# Patient Record
Sex: Female | Born: 1935 | Race: White | Hispanic: No | Smoking: Former smoker
Health system: Southern US, Community
[De-identification: ages and names within clinical notes are randomized; demographics above are authoritative.]

## PROBLEM LIST (undated history)

## (undated) DIAGNOSIS — E785 Hyperlipidemia, unspecified: Secondary | ICD-10-CM

## (undated) DIAGNOSIS — E039 Hypothyroidism, unspecified: Secondary | ICD-10-CM

## (undated) DIAGNOSIS — F32A Depression, unspecified: Secondary | ICD-10-CM

## (undated) DIAGNOSIS — I509 Heart failure, unspecified: Secondary | ICD-10-CM

## (undated) DIAGNOSIS — I10 Essential (primary) hypertension: Secondary | ICD-10-CM

## (undated) DIAGNOSIS — T8859XA Other complications of anesthesia, initial encounter: Secondary | ICD-10-CM

## (undated) DIAGNOSIS — K219 Gastro-esophageal reflux disease without esophagitis: Secondary | ICD-10-CM

## (undated) DIAGNOSIS — C801 Malignant (primary) neoplasm, unspecified: Secondary | ICD-10-CM

## (undated) DIAGNOSIS — M1990 Unspecified osteoarthritis, unspecified site: Secondary | ICD-10-CM

## (undated) DIAGNOSIS — N289 Disorder of kidney and ureter, unspecified: Secondary | ICD-10-CM

## (undated) DIAGNOSIS — E119 Type 2 diabetes mellitus without complications: Secondary | ICD-10-CM

## (undated) DIAGNOSIS — I2510 Atherosclerotic heart disease of native coronary artery without angina pectoris: Secondary | ICD-10-CM

## (undated) HISTORY — PX: CAROTID ENDARTERECTOMY: SUR193

## (undated) HISTORY — PX: CORONARY ANGIOPLASTY: SHX604

## (undated) HISTORY — PX: CORONARY STENT PLACEMENT: SHX1402

## (undated) HISTORY — DX: Type 2 diabetes mellitus without complications: E11.9

## (undated) HISTORY — DX: Essential (primary) hypertension: I10

## (undated) HISTORY — PX: TOTAL VAGINAL HYSTERECTOMY: SHX2548

## (undated) HISTORY — DX: Hyperlipidemia, unspecified: E78.5

## (undated) HISTORY — PX: CARDIAC CATHETERIZATION: SHX172

## (undated) HISTORY — DX: Atherosclerotic heart disease of native coronary artery without angina pectoris: I25.10

---

## 2010-09-21 DIAGNOSIS — I219 Acute myocardial infarction, unspecified: Secondary | ICD-10-CM

## 2010-09-21 HISTORY — DX: Acute myocardial infarction, unspecified: I21.9

## 2019-11-16 ENCOUNTER — Other Ambulatory Visit: Payer: Self-pay

## 2019-11-16 ENCOUNTER — Ambulatory Visit (INDEPENDENT_AMBULATORY_CARE_PROVIDER_SITE_OTHER): Payer: Medicare Other | Admitting: Pulmonary Disease

## 2019-11-16 ENCOUNTER — Telehealth: Payer: Self-pay | Admitting: Pulmonary Disease

## 2019-11-16 ENCOUNTER — Ambulatory Visit (INDEPENDENT_AMBULATORY_CARE_PROVIDER_SITE_OTHER): Payer: Medicare Other

## 2019-11-16 ENCOUNTER — Other Ambulatory Visit (HOSPITAL_COMMUNITY)
Admit: 2019-11-16 | Discharge: 2019-11-16 | Disposition: A | Discharge status: Home / Self Care | Payer: Medicare Other | Source: Ambulatory Visit | Attending: Pulmonary Disease | Admitting: Pulmonary Disease

## 2019-11-16 ENCOUNTER — Encounter: Payer: Self-pay | Admitting: Pulmonary Disease

## 2019-11-16 ENCOUNTER — Other Ambulatory Visit (HOSPITAL_COMMUNITY)
Admission: RE | Admit: 2019-11-16 | Discharge: 2019-11-16 | Disposition: A | Discharge status: Home / Self Care | Payer: Medicare Other | Source: Ambulatory Visit | Attending: Pulmonary Disease | Admitting: Pulmonary Disease

## 2019-11-16 VITALS — BP 106/56 | HR 66 | Ht 67.0 in | Wt 114.2 lb

## 2019-11-16 DIAGNOSIS — Z9689 Presence of other specified functional implants: Secondary | ICD-10-CM | POA: Diagnosis not present

## 2019-11-16 DIAGNOSIS — J90 Pleural effusion, not elsewhere classified: Secondary | ICD-10-CM | POA: Insufficient documentation

## 2019-11-16 LAB — BODY FLUID CELL COUNT WITH DIFFERENTIAL
Eos, Fluid: 66 %
Lymphs, Fluid: 26 %
Monocyte-Macrophage-Serous Fluid: 7 % — ABNORMAL LOW (ref 50–90)
Neutrophil Count, Fluid: 1 % (ref 0–25)
Total Nucleated Cell Count, Fluid: 1988 cu mm — ABNORMAL HIGH (ref 0–1000)

## 2019-11-16 LAB — ALBUMIN: Albumin: 3 g/dL — ABNORMAL LOW (ref 3.5–5.2)

## 2019-11-16 LAB — PROTEIN, TOTAL: Total Protein: 6 g/dL (ref 6.0–8.3)

## 2019-11-16 LAB — ALBUMIN, PLEURAL OR PERITONEAL FLUID: Albumin, Fluid: 1.3 g/dL

## 2019-11-16 IMAGING — DX DG CHEST 2V
2 series · 2 of 2 positions shown · non-contrast
Comparison: None.

CLINICAL DATA: Left-sided pleural effusion

EXAM:
CHEST - 2 VIEW

[chest pa]
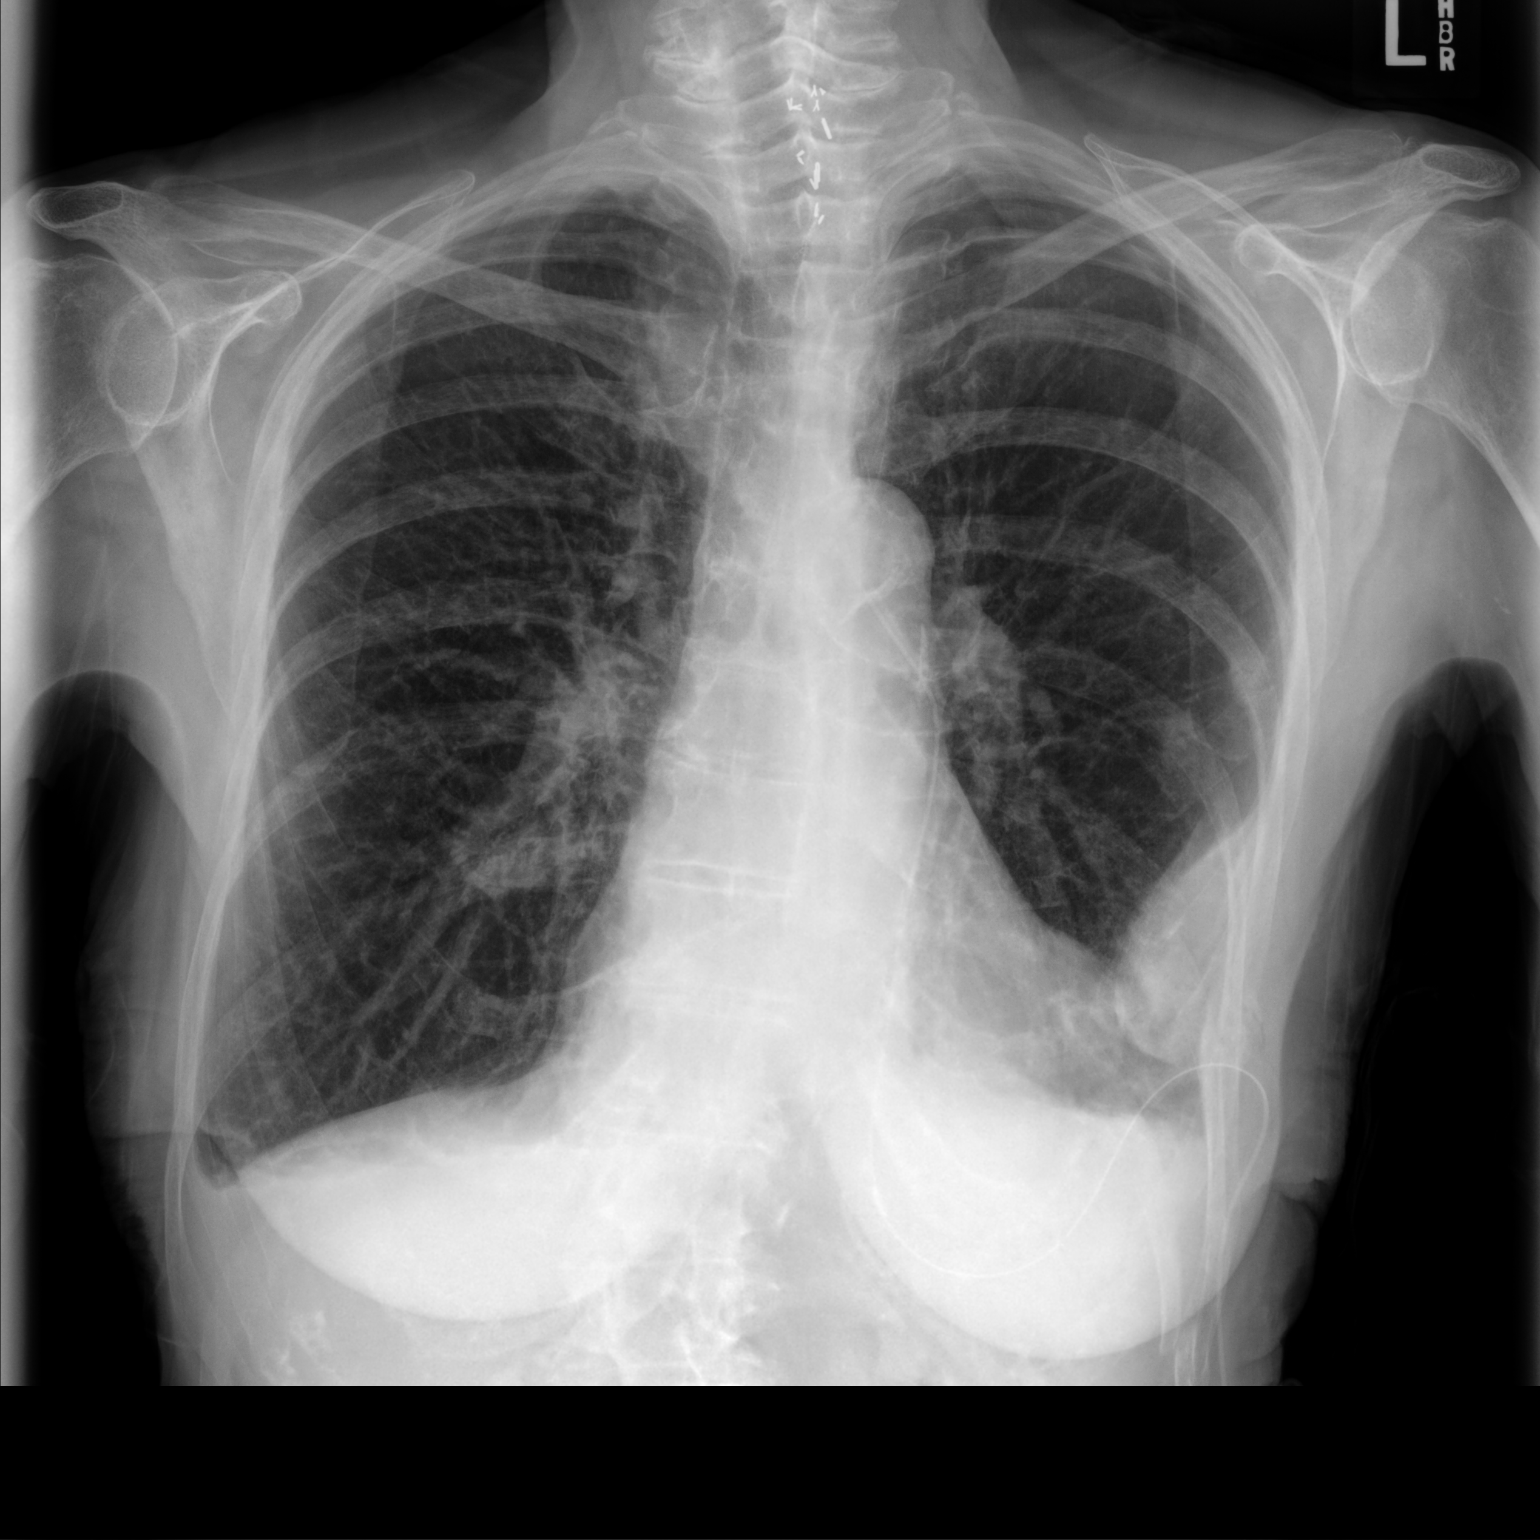

[chest lat]
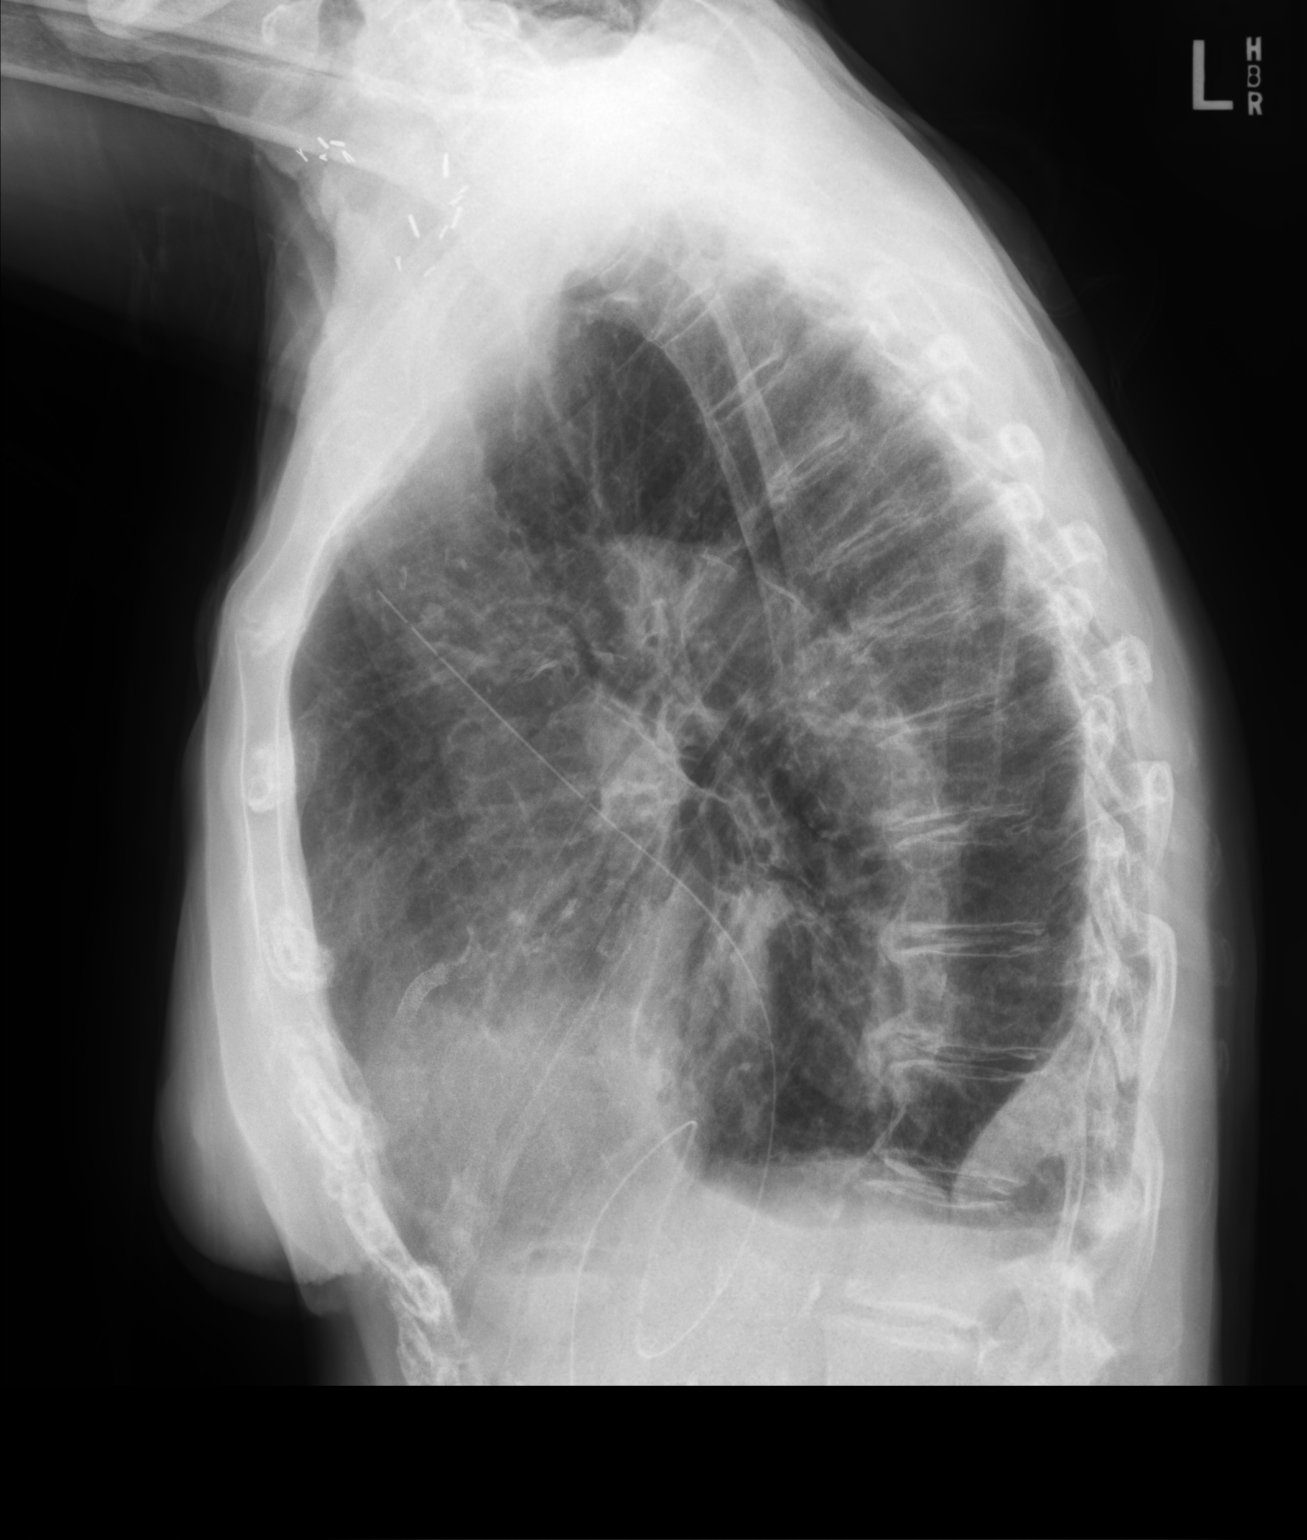

[2 of 2 positions shown; findings below may reference images not displayed]

FINDINGS: There is a chest drain present with the tip located anterior to the
aortic arch on the left medially. There is a minimal left apical
pneumothorax. There is an ovoid opacity along the posterior, lateral
aspect of the left hemithorax measuring 5.9 x 3.3 cm. Question
hematoma versus loculated effusion in this area. Note that there are
nearby rib fractures. There is atelectatic change in the left base.
There is a minimal right pleural effusion with mild lateral right
base atelectasis. Lungs elsewhere clear.

Heart size and pulmonary vascularity are normal. There is a stent in
the right coronary artery. No adenopathy evident. There is aortic
atherosclerosis. There are surgical clips in the upper thoracic
region in thyroid region.
IMPRESSION: Drain present anteriorly and medially on the left. Question
loculated effusion versus hematoma versus mass along the lateral
left hemithorax with nearby mildly displaced rib fractures. There is
a minimal left apical pneumothorax.

Minimal right pleural effusion with bibasilar atelectasis. Cardiac
silhouette normal. Aortic Atherosclerosis ([RN]-[RN]).

These results will be called to the ordering clinician or
representative by the Radiologist Assistant, and communication
documented in the PACS or zVision Dashboard.

## 2019-11-16 NOTE — Telephone Encounter (Signed)
Call report on cxr dated 11/16/19   IMPRESSION: Drain present anteriorly and medially on the left. Question loculated effusion versus hematoma versus mass along the lateral left hemithorax with nearby mildly displaced rib fractures. There is a minimal left apical pneumothorax.  Minimal right pleural effusion with bibasilar atelectasis. Cardiac silhouette normal. Aortic Atherosclerosis (ICD10-I70.0).  These results will be called to the ordering clinician or representative by the Radiologist Assistant, and communication documented in the PACS or zVision Dashboard.   Electronically Signed   By: Lowella Grip III M.D.   On: 11/16/2019 09:50

## 2019-11-16 NOTE — Addendum Note (Signed)
Addended by: Suzzanne Cloud E on: 11/16/2019 11:21 AM   Modules accepted: Orders

## 2019-11-16 NOTE — Telephone Encounter (Signed)
Called [facility] and had to [facility]

## 2019-11-16 NOTE — Addendum Note (Signed)
Addended by: Suzzanne Cloud E on: 11/16/2019 11:16 AM   Modules accepted: Orders

## 2019-11-16 NOTE — Patient Instructions (Addendum)
Thank you for visiting Dr. Valeta Harms at [facility]. Today we recommend the following:  Orders Placed This Encounter  Procedures  . Body Fluid Culture  . DG Chest 2 View  . Body fluid cell count with differential  . Lactate Dehydrogenase (LDH)  . Protein, total  . Albumin  . Lactate Dehydrogenase (LDH)  . Protein, total  . Albumin, fluid - peritoneal or pleural  . Glucose, Body Fluid Other  . Triglycerides, Pleural Fluid   With CXR at time of next office visit.   Return in about 4 weeks (around 12/14/2019) for with APP or Dr. Valeta Harms.    Please do your part to reduce the spread of COVID-19.

## 2019-11-16 NOTE — Addendum Note (Signed)
Addended by: Lorretta Harp on: 11/16/2019 11:13 AM   Modules accepted: Orders

## 2019-11-16 NOTE — Addendum Note (Signed)
Addended by: Lorretta Harp on: 11/16/2019 11:02 AM   Modules accepted: Orders

## 2019-11-16 NOTE — Addendum Note (Signed)
Addended by: Suzzanne Cloud E on: 11/16/2019 11:30 AM   Modules accepted: Orders

## 2019-11-16 NOTE — Addendum Note (Signed)
Addended by: Suzzanne Cloud E on: 11/16/2019 11:31 AM   Modules accepted: Orders

## 2019-11-16 NOTE — Addendum Note (Signed)
Addended by: Suzzanne Cloud E on: 11/16/2019 11:07 AM   Modules accepted: Orders

## 2019-11-16 NOTE — Addendum Note (Signed)
Addended by: Lorretta Harp on: 11/16/2019 11:26 AM   Modules accepted: Orders

## 2019-11-16 NOTE — Addendum Note (Signed)
Addended by: Suzzanne Cloud E on: 11/16/2019 11:24 AM   Modules accepted: Orders

## 2019-11-16 NOTE — Addendum Note (Signed)
Addended by: Suzzanne Cloud E on: 11/16/2019 11:27 AM   Modules accepted: Orders

## 2019-11-16 NOTE — Progress Notes (Signed)
Synopsis: Referred in Feb 2021 for new IPC, from [place] by No ref. provider found  Subjective:   PATIENT ID: [name] GENDER: female DOB: 29-May-1932, MRN: [id number]  Chief Complaint  Patient presents with  . Consult    Pt recently moved from [place]. Self referral due to left lung filling up with fluid. Pt's daughter stated pt fell 2 months ago and broke 5 ribs and she states former pulmonologist is unsure if that could be the reason with lung filling up with fluid. Pt does now have a catheter that they have been draining.    86 yo FM, fall in dec 2020, not hospitalized but had left sided broken ribs, went back to urgent care and sent pulmonary due to knew effusion. Tapped new right sided effusion X5. Each time around 1 L of fluid. Sent to ED. Evaluated by thoracic surgery and decision made for inpatient IPC placement.  Today here for follow-up.  Leaving from [place] she has moved here to the [place] area to live with her daughter.  The indwelling pleural catheter was placed on February 16.  Since then they have been draining this daily.  Patient has had no pain or discomfort.  Site has been draining approximately 200 cc/day with daily drainage.  They have brought their log with them.  They were set up with [facility].   Past Medical History:  Diagnosis Date  . DM (diabetes mellitus) ([place])   . HTN (hypertension)   . Myocardial infarct [facility]) 2012     Family History  Problem Relation Age of Onset  . Alzheimer's disease Mother   . Heart attack Father   . Heart disease Father   . Lymphoma Sister   . Heart failure Brother      Past Surgical History:  Procedure Laterality Date  . CORONARY STENT PLACEMENT      Social History   Socioeconomic History  . Marital status: Widowed    Spouse name: Not on file  . Number of children: Not on file  . Years of education: Not on file  . Highest education level: Not on file  Occupational History  . Not on file    Tobacco Use  . Smoking status: Former Smoker    Packs/day: 1.00    Years: 2.00    Pack years: 2.00    Types: Cigarettes    Quit date: 11/15/1969    Years since quitting: 50.0  . Smokeless tobacco: Never Used  Substance and Sexual Activity  . Alcohol use: Never  . Drug use: Never  . Sexual activity: Not on file  Other Topics Concern  . Not on file  Social History Narrative  . Not on file   Social Determinants of Health   Financial Resource Strain:   . Difficulty of Paying Living Expenses: Not on file  Food Insecurity:   . Worried About Charity fundraiser in the Last Year: Not on file  . Ran Out of Food in the Last Year: Not on file  Transportation Needs:   . Lack of Transportation (Medical): Not on file  . Lack of Transportation (Non-Medical): Not on file  Physical Activity:   . Days of Exercise per Week: Not on file  . Minutes of Exercise per Session: Not on file  Stress:   . Feeling of Stress : Not on file  Social Connections:   . Frequency of Communication with Friends and Family: Not on file  . Frequency of  Social Gatherings with Friends and Family: Not on file  . Attends Religious Services: Not on file  . Active Member of Clubs or Organizations: Not on file  . Attends Archivist Meetings: Not on file  . Marital Status: Not on file  Intimate Partner Violence:   . Fear of Current or Ex-Partner: Not on file  . Emotionally Abused: Not on file  . Physically Abused: Not on file  . Sexually Abused: Not on file     Allergies  Allergen Reactions  . Lisinopril     cough     Outpatient Medications Prior to Visit  Medication Sig Dispense Refill  . atorvastatin (LIPITOR) 80 MG tablet Take 80 mg by mouth every evening.    . Biotin 5000 MCG TABS Take 5,000 mcg by mouth daily.    . Cholecalciferol (VITAMIN D3 SUPER STRENGTH) 50 MCG (2000 UT) TABS Take 2,000 Units by mouth daily.    . clopidogrel (PLAVIX) 75 MG tablet Take 75 mg by mouth 3 (three) times a  week. Take Mon, Wed, Fri    . Coenzyme Q10 (COQ10) 200 MG CAPS Take 200 mg by mouth every evening.    Marland Kitchen levothyroxine (SYNTHROID) 25 MCG tablet Take 25 mcg by mouth daily before breakfast.    . losartan (COZAAR) 100 MG tablet Take 100 mg by mouth every evening.    . Metoprolol Tartrate 75 MG TABS Take 75 mg by mouth daily. Take 1.5 pills daily    . pioglitazone (ACTOS) 15 MG tablet Take 15 mg by mouth daily.    . sertraline (ZOLOFT) 50 MG tablet Take 50 mg by mouth at bedtime. Take 1/2 tablet at bedtime     No facility-administered medications prior to visit.    Review of Systems  Constitutional: Negative for chills, fever, malaise/fatigue and weight loss.  HENT: Negative for hearing loss, sore throat and tinnitus.   Eyes: Negative for blurred vision and double vision.  Respiratory: Positive for cough and shortness of breath. Negative for hemoptysis, sputum production, wheezing and stridor.   Cardiovascular: Negative for chest pain, palpitations, orthopnea, leg swelling and PND.  Gastrointestinal: Negative for abdominal pain, constipation, diarrhea, heartburn, nausea and vomiting.  Genitourinary: Negative for dysuria, hematuria and urgency.  Musculoskeletal: Negative for joint pain and myalgias.  Skin: Negative for itching and rash.  Neurological: Negative for dizziness, tingling, weakness and headaches.  Endo/Heme/Allergies: Negative for environmental allergies. Does not bruise/bleed easily.  Psychiatric/Behavioral: Negative for depression. The patient is not nervous/anxious and does not have insomnia.   All other systems reviewed and are negative.    Objective:  Physical Exam Vitals reviewed.  Constitutional:      General: She is not in acute distress.    Appearance: She is well-developed.  HENT:     Head: Normocephalic and atraumatic.  Eyes:     General: No scleral icterus.    Conjunctiva/sclera: Conjunctivae normal.     Pupils: Pupils are equal, round, and reactive to  light.  Neck:     Vascular: No JVD.     Trachea: No tracheal deviation.  Cardiovascular:     Rate and Rhythm: Normal rate and regular rhythm.     Heart sounds: Normal heart sounds. No murmur.  Pulmonary:     Effort: Pulmonary effort is normal. No tachypnea, accessory muscle usage or respiratory distress.     Breath sounds: No stridor. No wheezing, rhonchi or rales.     Comments: Some diminished breath sounds in the left in comparison  to the right.  Left chest drain tunneled superiorly and over the rib edge.  Insertion site clean dry and intact. Abdominal:     General: Bowel sounds are normal. There is no distension.     Palpations: Abdomen is soft.     Tenderness: There is no abdominal tenderness.  Musculoskeletal:        General: No tenderness.     Cervical back: Neck supple.  Lymphadenopathy:     Cervical: No cervical adenopathy.  Skin:    General: Skin is warm and dry.     Capillary Refill: Capillary refill takes less than 2 seconds.     Findings: No rash.  Neurological:     Mental Status: She is alert and oriented to person, place, and time.  Psychiatric:        Behavior: Behavior normal.      Vitals:   11/16/19 0907  BP: (!) 106/56  Pulse: 66  SpO2: 98%  Weight: 114 lb 3.2 oz (51.8 kg)  Height: 5\' 7"  (1.702 m)   98% on RA BMI Readings from Last 3 Encounters:  11/16/19 17.89 kg/m   Wt Readings from Last 3 Encounters:  11/16/19 114 lb 3.2 oz (51.8 kg)     CBC No results found for: WBC, RBC, HGB, HCT, PLT, MCV, MCH, MCHC, RDW, LYMPHSABS, MONOABS, EOSABS, BASOSABS   Chest Imaging: 11/16/2019: Chest x-ray Left-sided loculated effusion.  Chest drain in place looks like comes and over top of the rib and then down and then extends up into the chest. The patient's images have been independently reviewed by me.    Pulmonary Functions Testing Results: No flowsheet data found.     Assessment & Plan:     ICD-10-CM   1. Recurrent left pleural effusion  J90 DG  Chest 2 View    Body fluid cell count with differential    Lactate Dehydrogenase (LDH)    Protein, total    Albumin    Body Fluid Culture    Cytology - non pap    Lactate Dehydrogenase (LDH)    Protein, total    Albumin, fluid - peritoneal or pleural    Glucose, Body Fluid Other    Triglycerides, Pleural Fluid  2. Chest tube in place  Z96.89 DG Chest 2 View    Body fluid cell count with differential    Lactate Dehydrogenase (LDH)    Protein, total    Albumin    Body Fluid Culture    Cytology - non pap    Lactate Dehydrogenase (LDH)    Protein, total    Albumin, fluid - peritoneal or pleural    Glucose, Body Fluid Other    Triglycerides, Pleural Fluid  3. Pleural effusion  J90 Body fluid cell count with differential    Lactate Dehydrogenase (LDH)    Protein, total    Albumin    Body Fluid Culture    Cytology - non pap    Lactate Dehydrogenase (LDH)    Protein, total    Albumin, fluid - peritoneal or pleural    Glucose, Body Fluid Other    Triglycerides, Pleural Fluid    Discussion:  This is a 86 year old female with a recurrent left-sided pleural effusion.  Recently placed IPC on the left chest.  Currently draining around 200 cc a day.  This is a manual siphon device.  No evidence of cellulitis at insertion site  Plan: We attained 2 view chest x-ray today in the office to ensure appropriate tube placement.  Please see interpretation and read above. Siphon fluid today from chest cavity with appropriate connection device. Sterile technique was used for pleural fluid evacuation in the office.  We also used chlorhexidine to clean the insertion site and remove sutures which were still in place.  Approximately 400 cc of amber fluid was removed.  This was sent to the laboratory for analysis.  Please see orders for standard pleural fluid analysis. Patient was instructed as well as daughter on appropriate dressing changes. We will also communicate our recommendations on drainage  with [facility] there home health provider.  Greater than 50% of this patient's 72-minute office visit spent face-to-face discussing the recommendations treatment plan.   Current Outpatient Medications:  .  atorvastatin (LIPITOR) 80 MG tablet, Take 80 mg by mouth every evening., Disp: , Rfl:  .  Biotin 5000 MCG TABS, Take 5,000 mcg by mouth daily., Disp: , Rfl:  .  Cholecalciferol (VITAMIN D3 SUPER STRENGTH) 50 MCG (2000 UT) TABS, Take 2,000 Units by mouth daily., Disp: , Rfl:  .  clopidogrel (PLAVIX) 75 MG tablet, Take 75 mg by mouth 3 (three) times a week. Take Mon, Wed, Fri, Disp: , Rfl:  .  Coenzyme Q10 (COQ10) 200 MG CAPS, Take 200 mg by mouth every evening., Disp: , Rfl:  .  levothyroxine (SYNTHROID) 25 MCG tablet, Take 25 mcg by mouth daily before breakfast., Disp: , Rfl:  .  losartan (COZAAR) 100 MG tablet, Take 100 mg by mouth every evening., Disp: , Rfl:  .  Metoprolol Tartrate 75 MG TABS, Take 75 mg by mouth daily. Take 1.5 pills daily, Disp: , Rfl:  .  pioglitazone (ACTOS) 15 MG tablet, Take 15 mg by mouth daily., Disp: , Rfl:  .  sertraline (ZOLOFT) 50 MG tablet, Take 50 mg by mouth at bedtime. Take 1/2 tablet at bedtime, Disp: , Rfl:     Pleural fluid drainage procedure note through indwelling pleural catheter  Pre-operative Diagnosis: Left-sided pleural effusion  Post-operative Diagnosis: Left-sided pleural effusion  Indications: Left-sided pleural effusion  Procedure Details  Consent: Informed consent was obtained.  Chest x-ray was obtained prior to fluid drainage to ensure appropriate catheter placement.  Under semi-sterile conditions the patient was positioned.  Sutures were removed.  Using standard connection tubing for indwelling pleural catheter using sterile technique fluid was obtained without any difficulties and minimal blood loss.  A dressing was applied to the wound and wound care instructions were provided.   Findings 400 ml of clear pleural  fluid was obtained. A sample was sent to Pathology for cytogenetics, flow, and cell counts, as well as for infection analysis.  Complications:  None; patient tolerated the procedure well.          Condition: stable    Garner Nash, DO [facility] 11/16/2019 9:30 AM

## 2019-11-16 NOTE — Telephone Encounter (Signed)
Reviewed in clinic.  Thanks Garner Nash, DO [facility] 11/16/2019 1:38 PM

## 2019-11-16 NOTE — Addendum Note (Signed)
Addended by: Suzzanne Cloud E on: 11/16/2019 11:08 AM   Modules accepted: Orders

## 2019-11-16 NOTE — Addendum Note (Signed)
Addended by: Suzzanne Cloud E on: 11/16/2019 11:35 AM   Modules accepted: Orders

## 2019-11-16 NOTE — Addendum Note (Signed)
Addended by: Suzzanne Cloud E on: 11/16/2019 11:10 AM   Modules accepted: Orders

## 2019-11-17 ENCOUNTER — Telehealth: Payer: Self-pay | Admitting: Pulmonary Disease

## 2019-11-17 LAB — PROTEIN, BODY FLUID (OTHER): Protein, Fluid: 2.2 g/dL

## 2019-11-17 LAB — CYTOLOGY - NON PAP

## 2019-11-17 LAB — GLUCOSE, BODY FLUID OTHER: Glucose, Fluid: 158 mg/dL

## 2019-11-17 LAB — LACTATE DEHYDROGENASE: LDH: 195 U/L (ref 120–250)

## 2019-11-17 NOTE — Telephone Encounter (Signed)
[name] is returning phone call.  [name] phone number is [phone].

## 2019-11-17 NOTE — Telephone Encounter (Signed)
Spoke with [name] with [facility]  She states needs to know if she catheter was a pleurex catheter or aspira and is the drain still to be done daily  Per Dr Valeta Harms- drain fully every other day and catheter is a Gwen Pounds aware  Nothing further needed

## 2019-11-17 NOTE — Telephone Encounter (Signed)
If it's drained fully it dose not need to be drained everyday.   BLI

## 2019-11-17 NOTE — Telephone Encounter (Signed)
Spoke with the pt's daughter  She states that she was just made aware of the change to drain every other day, rather than daily  She is wanting to know why this change was made  Please advise thanks

## 2019-11-20 LAB — TRIGLYCERIDES, PLEURAL FLUID: Triglycerides, Pleural Fluid: 18 mg/dL

## 2019-11-20 LAB — LACTATE DEHYDROGENASE

## 2019-11-20 NOTE — Telephone Encounter (Signed)
Called and spoke with patient's daughter, Sherrie Mustache.  Dr. Juline Patch recommendations given. Understanding stated.  Nothing further at this time.

## 2019-11-21 LAB — BODY FLUID CULTURE

## 2019-11-25 ENCOUNTER — Other Ambulatory Visit: Payer: Self-pay

## 2019-11-25 ENCOUNTER — Emergency Department (HOSPITAL_COMMUNITY)
Admission: EM | Admit: 2019-11-25 | Discharge: 2019-11-25 | Disposition: A | Discharge status: Home / Self Care | Payer: Medicare Other | Attending: Emergency Medicine | Admitting: Emergency Medicine

## 2019-11-25 ENCOUNTER — Encounter (HOSPITAL_COMMUNITY): Payer: Self-pay

## 2019-11-25 ENCOUNTER — Emergency Department (HOSPITAL_COMMUNITY): Payer: Medicare Other

## 2019-11-25 DIAGNOSIS — S0003XA Contusion of scalp, initial encounter: Secondary | ICD-10-CM

## 2019-11-25 DIAGNOSIS — Y9389 Activity, other specified: Secondary | ICD-10-CM | POA: Insufficient documentation

## 2019-11-25 DIAGNOSIS — Z79899 Other long term (current) drug therapy: Secondary | ICD-10-CM | POA: Diagnosis not present

## 2019-11-25 DIAGNOSIS — I252 Old myocardial infarction: Secondary | ICD-10-CM | POA: Insufficient documentation

## 2019-11-25 DIAGNOSIS — I10 Essential (primary) hypertension: Secondary | ICD-10-CM | POA: Insufficient documentation

## 2019-11-25 DIAGNOSIS — Z87891 Personal history of nicotine dependence: Secondary | ICD-10-CM | POA: Diagnosis not present

## 2019-11-25 DIAGNOSIS — Y999 Unspecified external cause status: Secondary | ICD-10-CM | POA: Diagnosis not present

## 2019-11-25 DIAGNOSIS — W01198A Fall on same level from slipping, tripping and stumbling with subsequent striking against other object, initial encounter: Secondary | ICD-10-CM | POA: Diagnosis not present

## 2019-11-25 DIAGNOSIS — S0101XA Laceration without foreign body of scalp, initial encounter: Secondary | ICD-10-CM | POA: Insufficient documentation

## 2019-11-25 DIAGNOSIS — S0990XA Unspecified injury of head, initial encounter: Secondary | ICD-10-CM | POA: Diagnosis present

## 2019-11-25 DIAGNOSIS — W19XXXA Unspecified fall, initial encounter: Secondary | ICD-10-CM

## 2019-11-25 DIAGNOSIS — Y92010 Kitchen of single-family (private) house as the place of occurrence of the external cause: Secondary | ICD-10-CM | POA: Insufficient documentation

## 2019-11-25 DIAGNOSIS — E119 Type 2 diabetes mellitus without complications: Secondary | ICD-10-CM | POA: Insufficient documentation

## 2019-11-25 IMAGING — CT CT HEAD W/O CM
4 series · 17 of 47 positions shown, 19 images · non-contrast
Comparison: None.

CLINICAL DATA: 87-year-old who fell from a sitting position while
at home, striking back of the head on her kitchen floor. Laceration
to the back of the head. Patient currently anticoagulated with
Plavix. Initial encounter.

EXAM:
CT HEAD WITHOUT CONTRAST
TECHNIQUE: Contiguous axial images were obtained from the base of the skull
through the vertex without intravenous contrast.

[Series 3: head wo · axial · 0.44mm/px · z∈[-86,+34]mm · 7 of 32 slices shown, 9 images]
[im 4/32  brain]
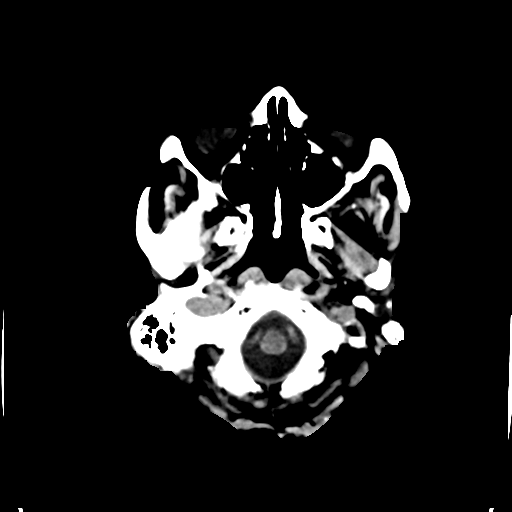
[im 4/32  bone]
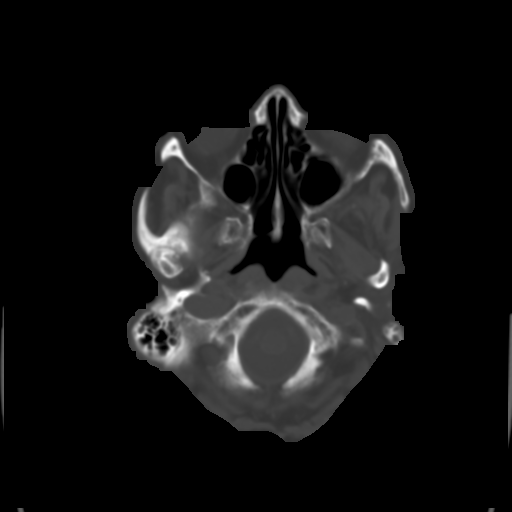
[im 8/32  brain]
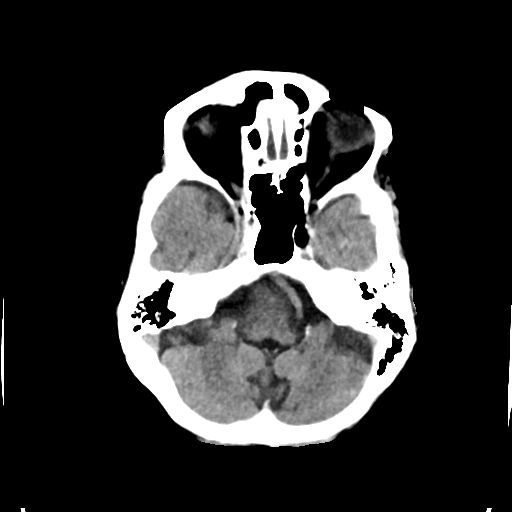
[im 12/32  brain]
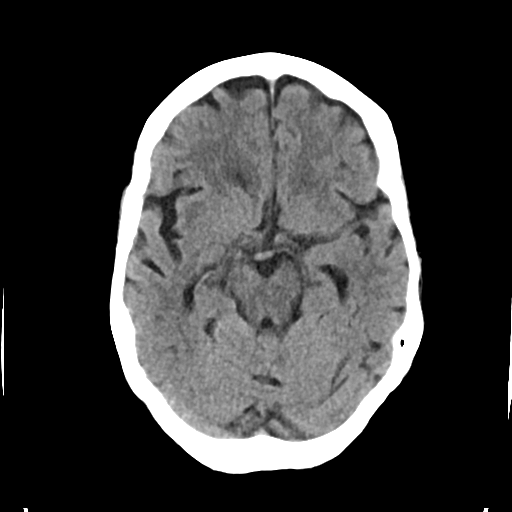
[im 16/32  brain]
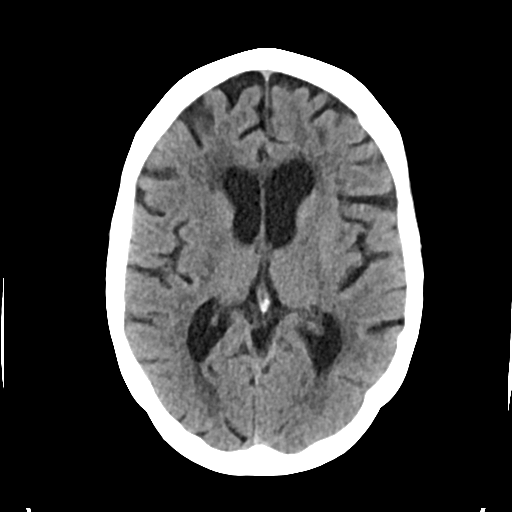
[im 20/32  brain]
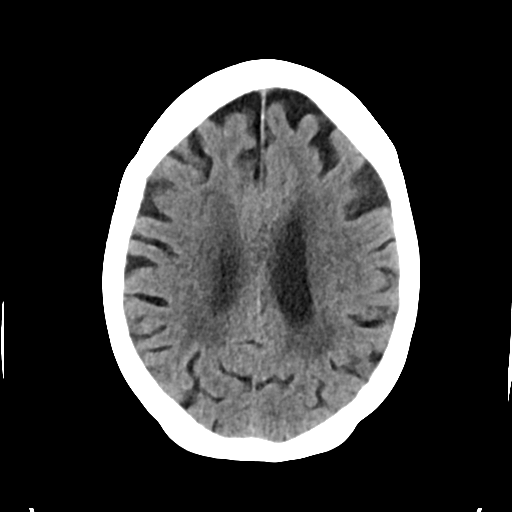
[im 20/32  bone]
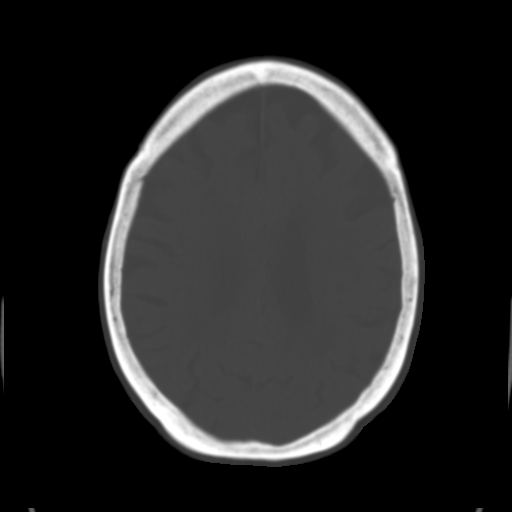
[im 24/32  brain]
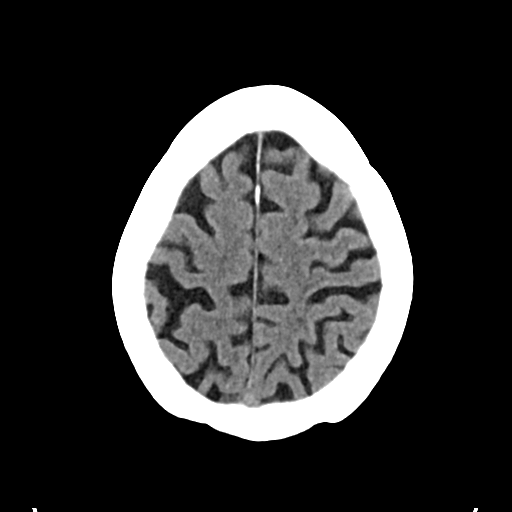
[im 28/32  brain]
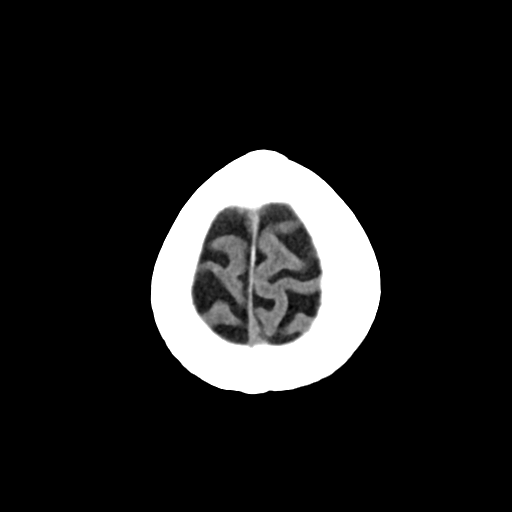

[Series 4: head bone · axial · 0.44mm/px · z∈[-87,-31]mm · 4 of 79 slices shown]
[im 8/79  bone]
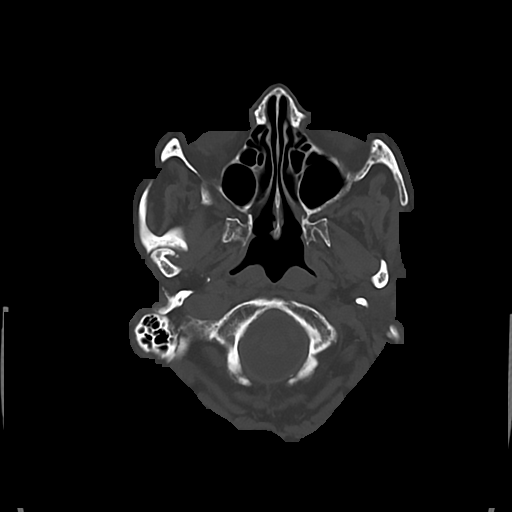
[im 16/79  bone]
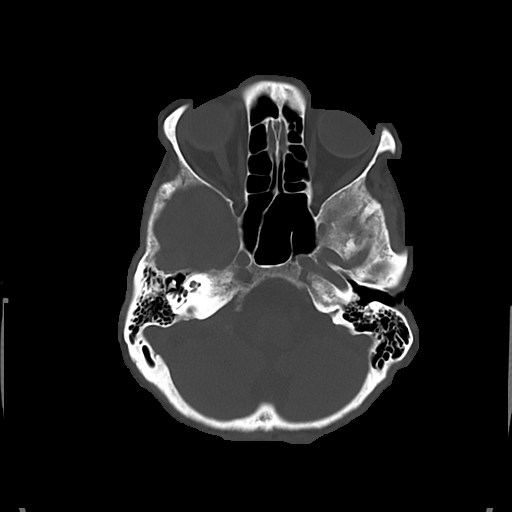
[im 24/79  bone]
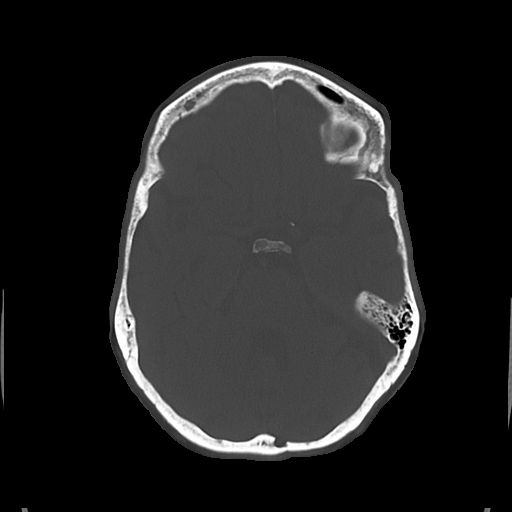
[im 36/79  bone]
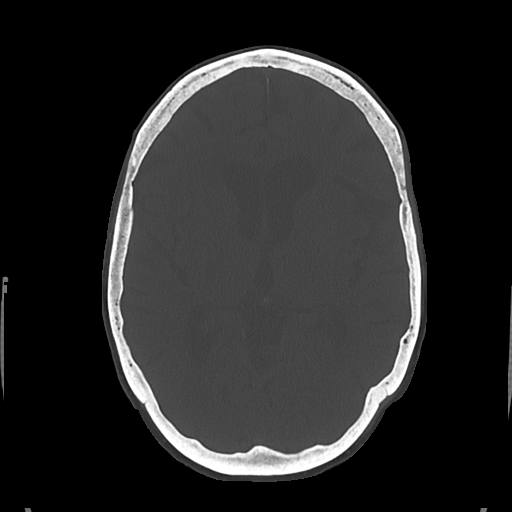

[Series 5: cor soft · coronal · 0.32mm/px · 3 of 64 slices shown]
[im 22/64  brain]
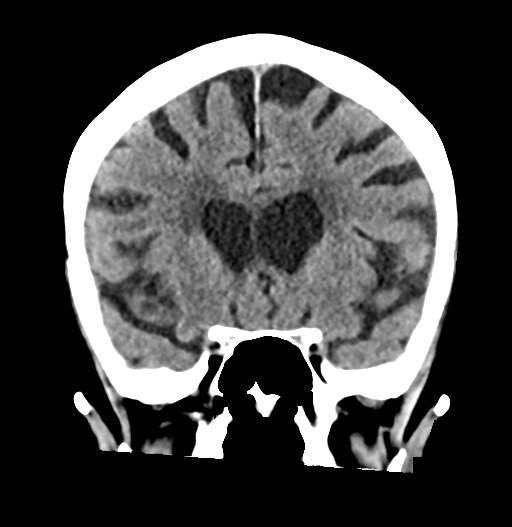
[im 29/64  brain]
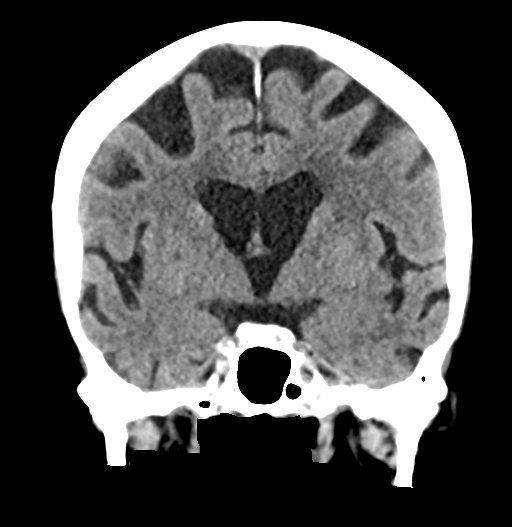
[im 36/64  brain]
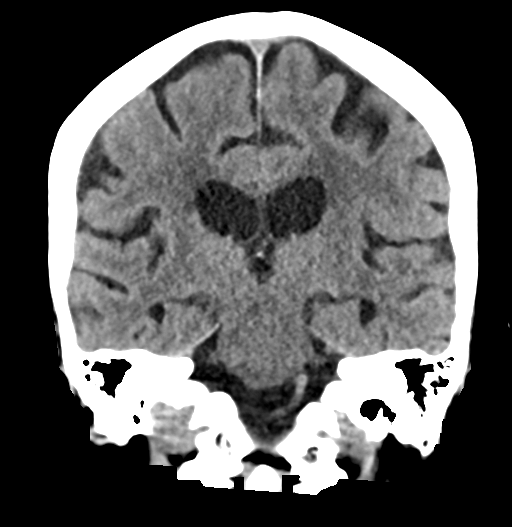

[Series 6: sag soft · sagittal · 0.33mm/px · 3 of 52 slices shown]
[im 18/52  brain]
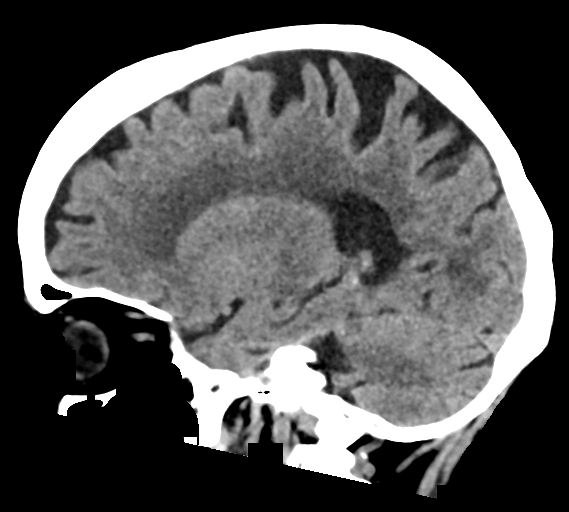
[im 26/52  brain]
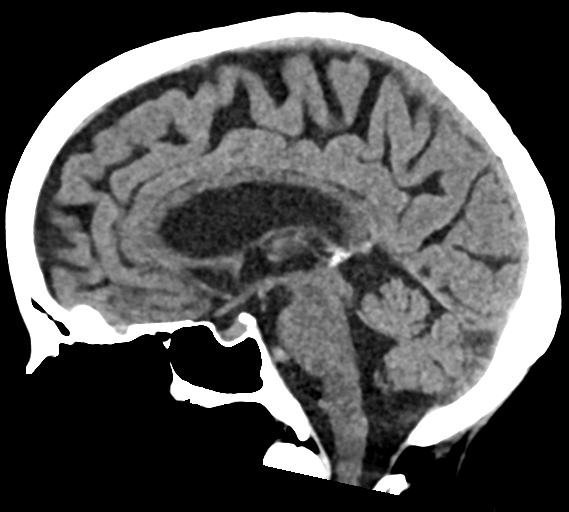
[im 35/52  brain]
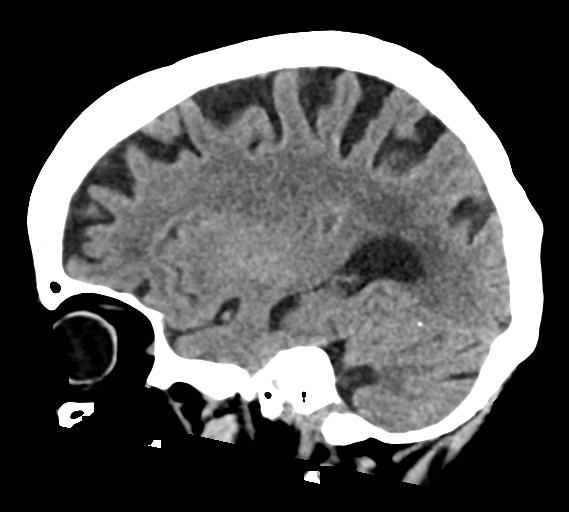

[17 of 47 positions shown; findings below may reference images not displayed]

FINDINGS: Brain: Moderate age related cortical, deep and cerebellar atrophy.
Moderate changes of small vessel disease of the white matter
diffusely. No mass lesion. No midline shift. No acute hemorrhage or
hematoma. No extra-axial fluid collections. No evidence of acute
infarction.

Vascular: Moderate to severe BILATERAL carotid siphon and mild
BILATERAL vertebral artery atherosclerosis. No hyperdense vessel.

Skull: No skull fracture or other focal osseous abnormality
involving the skull.

Sinuses/Orbits: Visualized paranasal sinuses, bilateral mastoid air
cells and bilateral middle ear cavities well-aerated. Visualized
orbits and globes normal in appearance.

Other: Small LEFT posterior parietal scalp hematoma.
IMPRESSION: 1. No acute intracranial abnormality.
2. Small LEFT posterior parietal scalp hematoma without underlying
skull fracture.
3. Moderate age related generalized atrophy and moderate chronic
microvascular ischemic changes of the white matter.

## 2019-11-25 MED ORDER — LIDOCAINE-EPINEPHRINE-TETRACAINE (LET) TOPICAL GEL: 3.0000 mL | Freq: Once | TOPICAL | Status: DC

## 2019-11-25 NOTE — Progress Notes (Signed)
TRN note:  Pt transported to CT and back without incident on CCM  [place] [phone]

## 2019-11-25 NOTE — ED Triage Notes (Signed)
Pt reports she bent over from her chair to pick up a pill when she missed the chair went to sit back down falling hitting her head on the kitchen floor. Small lac to back of head, bleeding controlled.

## 2019-11-25 NOTE — ED Provider Notes (Signed)
[place] EMERGENCY DEPARTMENT Provider Note   CSN: [id number] Arrival date & time: 11/25/19  1234     History Chief Complaint  Patient presents with  . Fall    [name] is a 86 y.o. female.  86 year old female brought in by daughter from home.  Patient states that she was bending over to pick up up off the floor and when she went to sit back in her chair the chair slid out from underneath her and she fell backwards hitting her head on the floor resulting in a small laceration to the back of her head.  Patient denies loss of consciousness, is on Plavix.  Bleeding is controlled, no other injuries or concerns.        Past Medical History:  Diagnosis Date  . DM (diabetes mellitus) ([place])   . HTN (hypertension)   . Myocardial infarct [facility]) 2012    There are no problems to display for this patient.   Past Surgical History:  Procedure Laterality Date  . CORONARY STENT PLACEMENT       OB History   No obstetric history on file.     Family History  Problem Relation Age of Onset  . Alzheimer's disease Mother   . Heart attack Father   . Heart disease Father   . Lymphoma Sister   . Heart failure Brother     Social History   Tobacco Use  . Smoking status: Former Smoker    Packs/day: 1.00    Years: 2.00    Pack years: 2.00    Types: Cigarettes    Quit date: 11/15/1969    Years since quitting: 50.0  . Smokeless tobacco: Never Used  Substance Use Topics  . Alcohol use: Never  . Drug use: Never    Home Medications Prior to Admission medications   Medication Sig Start Date End Date Taking? Authorizing Provider  atorvastatin (LIPITOR) 80 MG tablet Take 80 mg by mouth every evening.   Yes [provider]  Biotin 5000 MCG TABS Take 5,000 mcg by mouth daily.   Yes [provider]  Cholecalciferol (VITAMIN D3 SUPER STRENGTH) 50 MCG (2000 UT) TABS Take 2,000 Units by mouth daily.   Yes [provider]  clopidogrel  (PLAVIX) 75 MG tablet Take 75 mg by mouth 3 (three) times a week. Take Mon, Wed, Fri   Yes [provider]  levothyroxine (SYNTHROID) 25 MCG tablet Take 25 mcg by mouth daily before breakfast.   Yes [provider]  losartan (COZAAR) 100 MG tablet Take 100 mg by mouth every evening.   Yes [provider]  Metoprolol Tartrate 75 MG TABS Take 75 mg by mouth daily.    Yes [provider]  pioglitazone (ACTOS) 15 MG tablet Take 15 mg by mouth daily.   Yes [provider]  sertraline (ZOLOFT) 100 MG tablet Take 100 mg by mouth at bedtime.   Yes [provider]    Allergies    Lisinopril  Review of Systems   Review of Systems  Constitutional: Negative for fever.  Musculoskeletal: Negative for arthralgias, back pain, gait problem, joint swelling, myalgias, neck pain and neck stiffness.  Skin: Positive for wound.  Neurological: Negative for dizziness, weakness, light-headedness and headaches.  Hematological: Bruises/bleeds easily.  Psychiatric/Behavioral: Negative for confusion.  All other systems reviewed and are negative.   Physical Exam Updated Vital Signs BP (!) 142/53   Pulse 66   Temp 98.4 F (36.9 C)   Resp  20   Ht 5\' 7"  (1.702 m)   Wt 52.6 kg   SpO2 100%   BMI 18.17 kg/m   Physical Exam Vitals and nursing note reviewed.  Constitutional:      General: She is not in acute distress.    Appearance: She is well-developed. She is not diaphoretic.  HENT:     Head: Normocephalic.   Eyes:     Extraocular Movements: Extraocular movements intact.     Pupils: Pupils are equal, round, and reactive to light.  Pulmonary:     Effort: Pulmonary effort is normal.  Musculoskeletal:        General: No swelling, tenderness, deformity or signs of injury. Normal range of motion.     Cervical back: Neck supple. No tenderness.     Right lower leg: No edema.     Left lower leg: No edema.  Skin:    General: Skin is warm and dry.   Neurological:     Mental Status: She is alert and oriented to person, place, and time.  Psychiatric:        Behavior: Behavior normal.     ED Results / Procedures / Treatments   Labs (all labs ordered are listed, but only abnormal results are displayed) Labs Reviewed - No data to display  EKG None  Radiology CT Head Wo Contrast  Result Date: 11/25/2019 CLINICAL DATA:  86 year old who fell from a sitting position while at home, striking back of the head on her kitchen floor. Laceration to the back of the head. Patient currently anticoagulated with Plavix. Initial encounter. EXAM: CT HEAD WITHOUT CONTRAST TECHNIQUE: Contiguous axial images were obtained from the base of the skull through the vertex without intravenous contrast. COMPARISON:  None. FINDINGS: Brain: Moderate age related cortical, deep and cerebellar atrophy. Moderate changes of small vessel disease of the white matter diffusely. No mass lesion. No midline shift. No acute hemorrhage or hematoma. No extra-axial fluid collections. No evidence of acute infarction. Vascular: Moderate to severe BILATERAL carotid siphon and mild BILATERAL vertebral artery atherosclerosis. No hyperdense vessel. Skull: No skull fracture or other focal osseous abnormality involving the skull. Sinuses/Orbits: Visualized paranasal sinuses, bilateral mastoid air cells and bilateral middle ear cavities well-aerated. Visualized orbits and globes normal in appearance. Other: Small LEFT posterior parietal scalp hematoma. IMPRESSION: 1. No acute intracranial abnormality. 2. Small LEFT posterior parietal scalp hematoma without underlying skull fracture. 3. Moderate age related generalized atrophy and moderate chronic microvascular ischemic changes of the white matter. Electronically Signed   By: Evangeline Dakin M.D.   On: 11/25/2019 13:48    Procedures .Marland KitchenLaceration Repair  Date/Time: 11/25/2019 3:17 PM Performed by: Tacy Learn, PA-C Authorized by: Tacy Learn, PA-C   Consent:    Consent obtained:  Verbal   Consent given by:  Patient   Risks discussed:  Infection, need for additional repair, pain, poor cosmetic result and poor wound healing   Alternatives discussed:  No treatment and delayed treatment Universal protocol:    Procedure explained and questions answered to patient or proxy's satisfaction: yes     Relevant documents present and verified: yes     Test results available and properly labeled: yes     Imaging studies available: yes     Required blood products, implants, devices, and special equipment available: yes     Site/side marked: yes     Immediately prior to procedure, a time out was called: yes     Patient identity confirmed:  Verbally  with patient Anesthesia (see MAR for exact dosages):    Anesthesia method:  Topical application   Topical anesthesia: topical spray. Laceration details:    Length (cm):  2.5   Depth (mm):  3 Repair type:    Repair type:  Simple Pre-procedure details:    Preparation:  Patient was prepped and draped in usual sterile fashion and imaging obtained to evaluate for foreign bodies Exploration:    Hemostasis achieved with:  Direct pressure   Wound exploration: wound explored through full range of motion and entire depth of wound probed and visualized     Contaminated: no   Treatment:    Area cleansed with:  Saline   Amount of cleaning:  Standard   Irrigation solution:  Sterile saline Skin repair:    Repair method:  Staples   Number of staples:  2 Approximation:    Approximation:  Close Post-procedure details:    Dressing:  Open (no dressing)   Patient tolerance of procedure:  Tolerated well, no immediate complications   (including critical care time)  Medications Ordered in ED Medications  lidocaine-EPINEPHrine-tetracaine (LET) topical gel (has no administration in time range)    ED Course  I have reviewed the triage vital signs and the nursing notes.  Pertinent labs &  imaging results that were available during my care of the patient were reviewed by me and considered in my medical decision making (see chart for details).  Clinical Course as of Nov 25 1519  Sat Nov 25, 5579  2467 86 year old female presents from home after a ground-level fall on Plavix resulting in a laceration to the back of her head.  No loss of consciousness, denies feeling weak or dizzy prior to the fall.  Patient fell when she was trying to sit in a chair in the chair rolled out from under her.  Patient was found to have a hematoma with laceration to the posterior left parietal scalp, bleeding was controlled.  Level 2 trauma called for ground-level fall on blood thinner.  CT head is negative.  Wound was cleaned, anesthetized, closed with 2 staples.  Discussed wound care follow-up and removal in 1 week.   [LM]    Clinical Course User Index [LM] Roque Lias   MDM Rules/Calculators/A&P                       Final Clinical Impression(s) / ED Diagnoses Final diagnoses:  Fall, initial encounter  Laceration of scalp, initial encounter  Hematoma of scalp, initial encounter    Rx / DC Orders ED Discharge Orders    None       Tacy Learn, PA-C 11/25/19 1521    Blanchie Dessert, MD 11/26/19 [phone]

## 2019-11-25 NOTE — ED Notes (Signed)
Pt transported to CT ?

## 2019-11-25 NOTE — Discharge Instructions (Addendum)
Clean wound with Dove soap on a q-tip. Apply Bacitracin ointment to area.

## 2019-11-25 NOTE — ED Notes (Signed)
Pt. Back from CT.

## 2019-11-25 NOTE — ED Notes (Signed)
Wound care has been done cleaned site.patient has gone to ct scan

## 2019-11-28 ENCOUNTER — Telehealth: Payer: Self-pay | Admitting: Pulmonary Disease

## 2019-11-28 NOTE — Telephone Encounter (Signed)
Garner Nash, DO  11/26/2019 4:57 PM EST    Daneil Dan, you can let her know that she has markers of chronic inflammatory effusion. We will need to make sure this disappears. Her eosinophils are elevated which can be seen after traumatic effusions. Cytology pending   Thanks,  BLI  Garner Nash, DO [facility] 11/26/2019 4:57 PM     Called and spoke with pt's daughter Jena Gauss letting her know the results from pt's pleural fluid labs and she verbalized understanding. Pt has already been made aware of the cytology results as it looks like that was placed twice. Nothing further needed.

## 2019-12-11 ENCOUNTER — Telehealth: Payer: Self-pay | Admitting: Cardiovascular Disease

## 2019-12-11 NOTE — Telephone Encounter (Signed)
Left message-ok to come to appt 

## 2019-12-11 NOTE — Telephone Encounter (Signed)
New Message    Pts daughter says she will need to assist the pt to her appt because she has a hard time remembering things and also uses a walker     Please advise

## 2019-12-13 ENCOUNTER — Encounter: Payer: Self-pay | Admitting: Pulmonary Disease

## 2019-12-13 ENCOUNTER — Other Ambulatory Visit: Payer: Self-pay

## 2019-12-13 ENCOUNTER — Ambulatory Visit (INDEPENDENT_AMBULATORY_CARE_PROVIDER_SITE_OTHER): Payer: Medicare Other

## 2019-12-13 ENCOUNTER — Ambulatory Visit (INDEPENDENT_AMBULATORY_CARE_PROVIDER_SITE_OTHER): Payer: Medicare Other | Admitting: Pulmonary Disease

## 2019-12-13 ENCOUNTER — Ambulatory Visit: Payer: Medicare Other | Admitting: Pulmonary Disease

## 2019-12-13 VITALS — BP 116/64 | HR 57 | Ht 67.0 in | Wt 114.4 lb

## 2019-12-13 DIAGNOSIS — Z9689 Presence of other specified functional implants: Secondary | ICD-10-CM

## 2019-12-13 DIAGNOSIS — J90 Pleural effusion, not elsewhere classified: Secondary | ICD-10-CM

## 2019-12-13 IMAGING — DX DG CHEST 2V
2 series · 2 of 2 positions shown · non-contrast
Comparison: Chest radiographs [DATE].

CLINICAL DATA: 87-year-old female with recurrent pleural effusion,
chest tube.

EXAM:
CHEST - 2 VIEW

[chest pa]
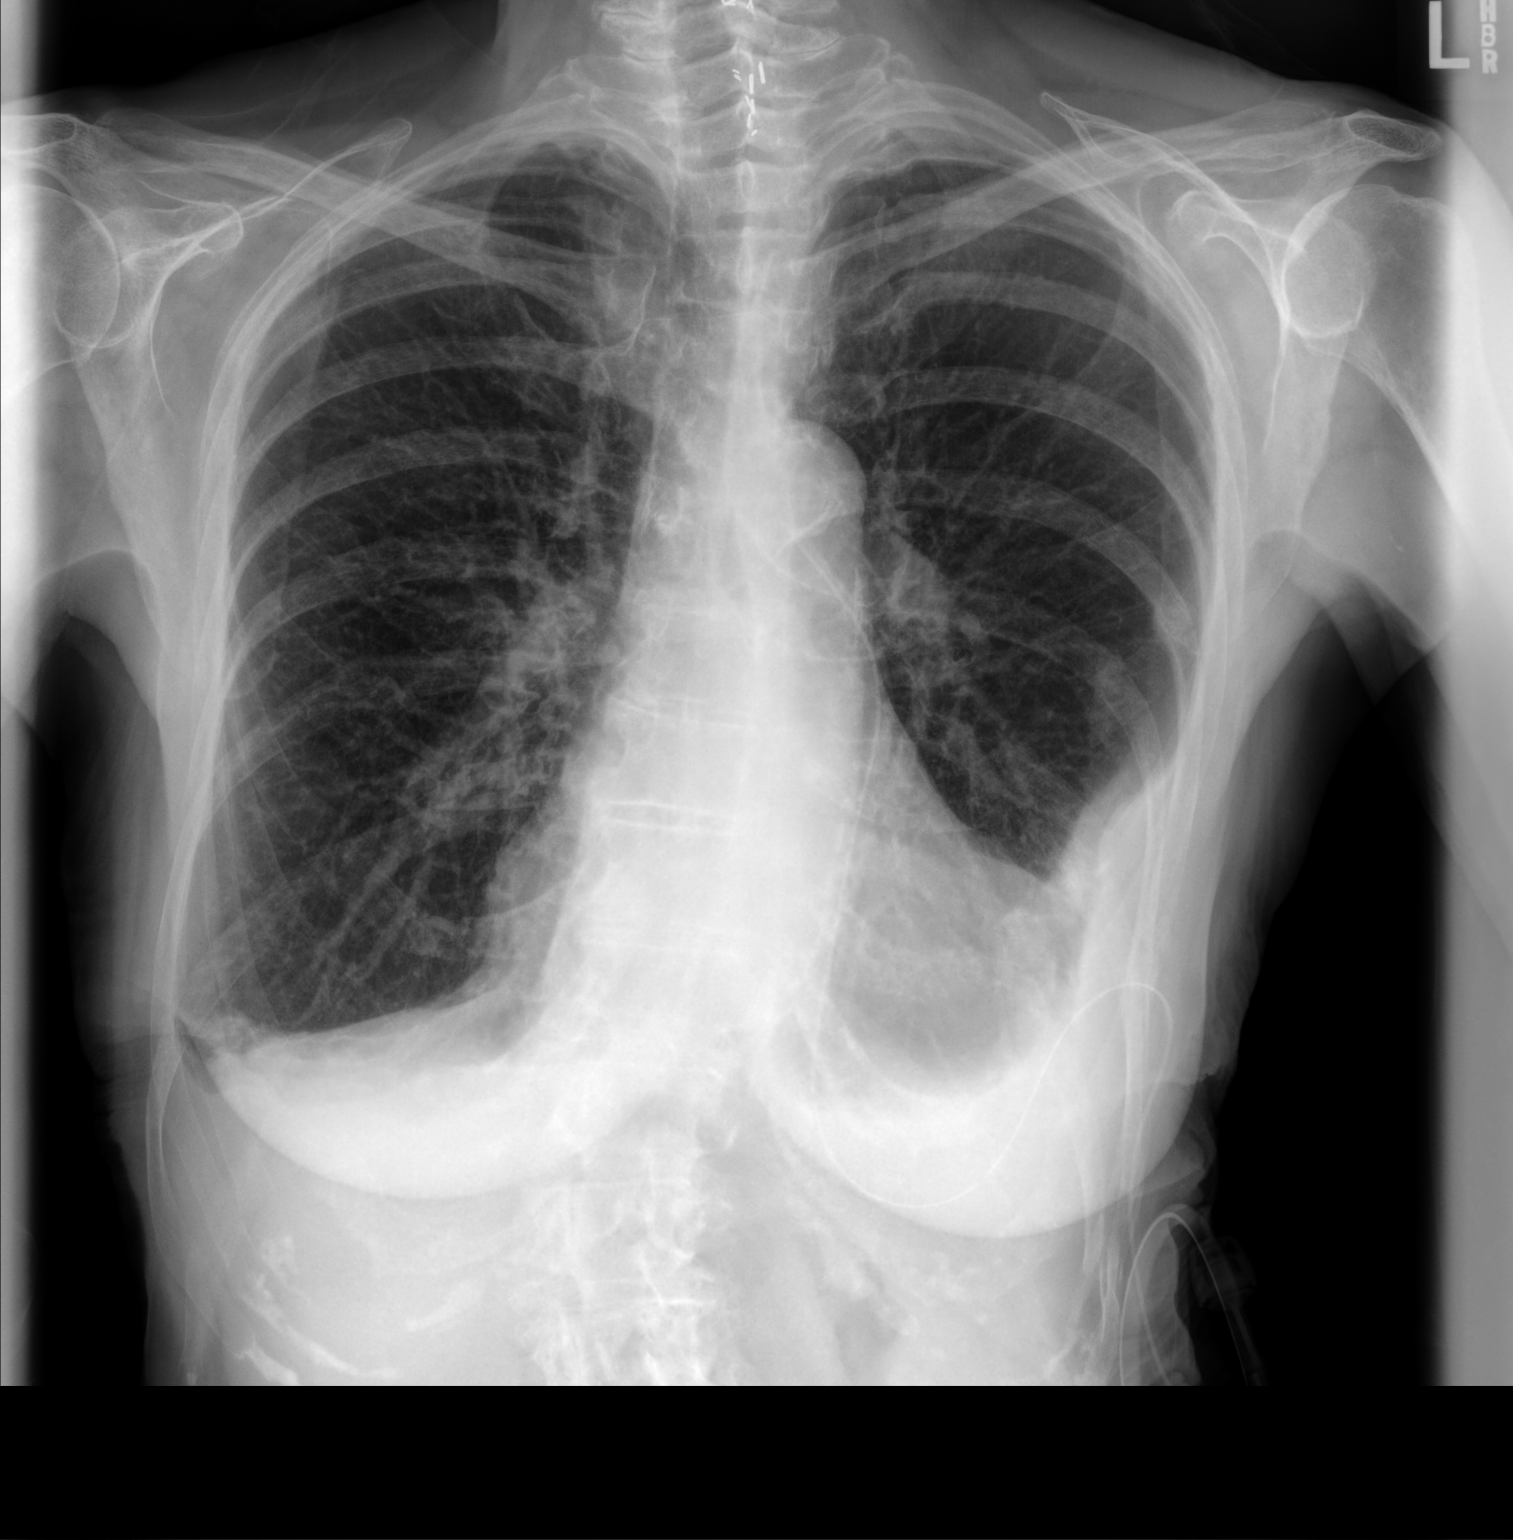

[chest lat]
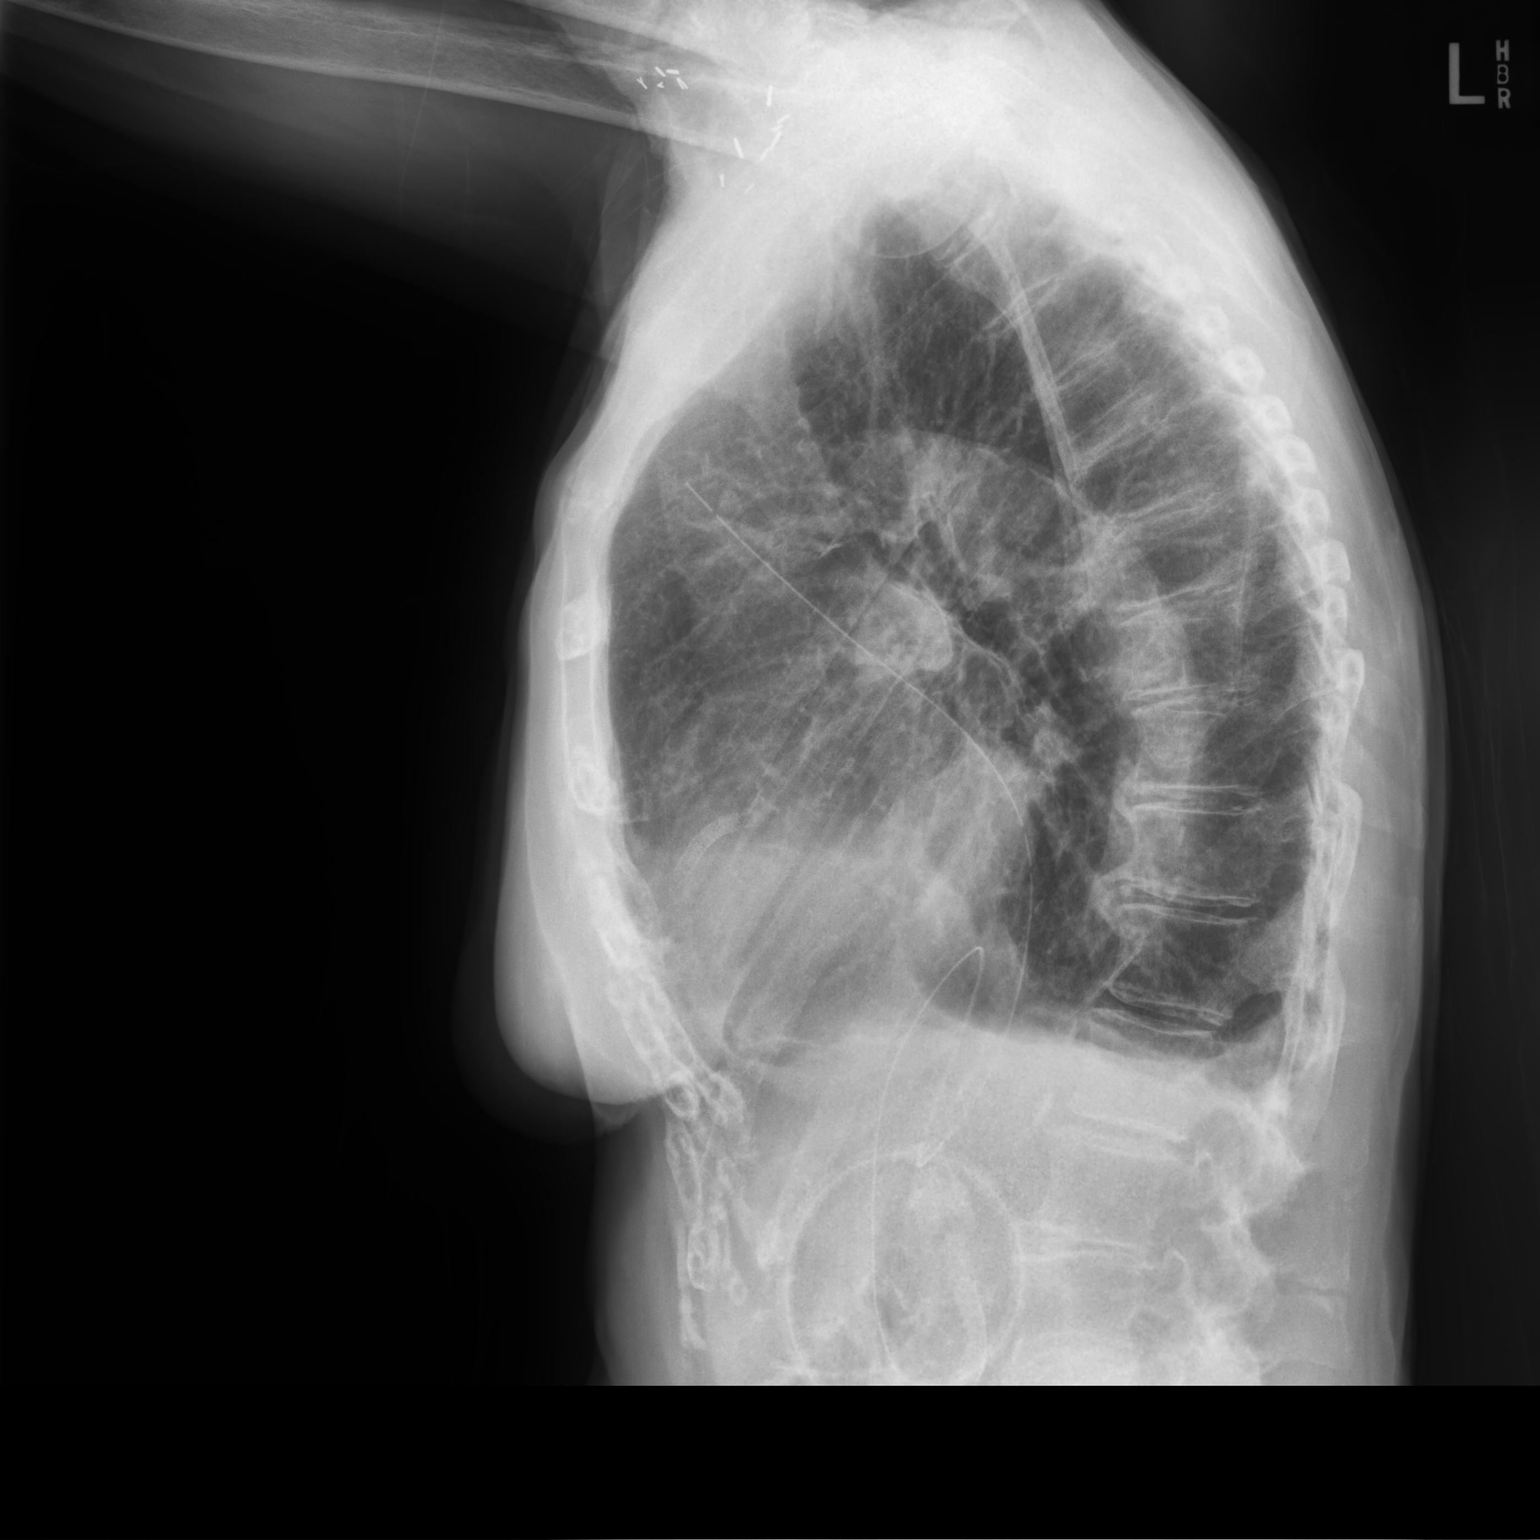

[2 of 2 positions shown; findings below may reference images not displayed]

FINDINGS: PA and lateral views. Unchanged percutaneous left chest tube which
courses to the anterior left lung apex. No pneumothorax identified.
Continued left lower lung pleural thickening and/or small volume
loculated fluid, mildly improved from last month.

Trace right pleural fluid or pleural thickening at the costophrenic
angle appears stable. Evidence of underlying pulmonary
hyperinflation. Mediastinal contours are stable and within normal
limits. Calcified aortic atherosclerosis. Visualized tracheal air
column is within normal limits. Stable surgical clips at the midline
thoracic inlet. No acute osseous abnormality identified. Negative
visible bowel gas pattern.
IMPRESSION: 1. Stable left chest tube. No pneumothorax.
2. Mildly improved left lower lung pleural thickening and/or small
volume of loculated effusion.
3. Suspect underlying pulmonary hyperinflation, and trace right lung
pleural thickening or fluid.
4.  Aortic Atherosclerosis ([ZX]-[ZX]).

## 2019-12-13 MED ORDER — METHYLPREDNISOLONE 4 MG PO TBPK: ORAL_TABLET | ORAL | 0 refills | Status: DC

## 2019-12-13 NOTE — Patient Instructions (Addendum)
Thank you for visiting Dr. Valeta Harms at [facility]. Today we recommend the following:  Medrol dose pack, tapering steroid pack  Please steroids with food and in the morning.   Return in about 6 weeks (around 01/24/2020).    Please do your part to reduce the spread of COVID-19.

## 2019-12-13 NOTE — Progress Notes (Signed)
Synopsis: Referred in Feb 2021 for new IPC, from [facility] [place] by Michael Boston, MD  Subjective:   PATIENT ID: Karina Medin GENDER: female DOB: 1932/05/05, MRN: [id number]  Chief Complaint  Patient presents with  . Follow-up    Chest xray performed today.  Pt states she has been doing good since last visit. Pt is still draining  the catheter every other day and getting out between 200-336ml.    86 yo FM, fall in dec 2020, not hospitalized but had left sided broken ribs, went back to urgent care and sent pulmonary due to knew effusion. Tapped new right sided effusion X5. Each time around 1 L of fluid. Sent to ED. Evaluated by thoracic surgery and decision made for inpatient IPC placement.  Today here for follow-up.  Leaving from [place] she has moved here to the [place] area to live with her daughter.  The indwelling pleural catheter was placed on February 16.  Since then they have been draining this daily.  Patient has had no pain or discomfort.  Site has been draining approximately 200 cc/day with daily drainage.  They have brought their log with them.  They were set up with [facility].  12/13/2019: Patient with IPC placed following history of trauma. Pleural fluid analysis from last office visit revealed multiple nucleated cells and 66% eosinophils. Otherwise has a low protein and LDH. She is still actively draining the effusion. She is draining approximately every other day with an average of 900 to 1000 cc/week. Otherwise is comfortable with no pain in the chest. Patient denies fevers chills night sweats. Home health has been assessing catheter and site.   Past Medical History:  Diagnosis Date  . DM (diabetes mellitus) ([place])   . HTN (hypertension)   . Myocardial infarct [facility]) 2012     Family History  Problem Relation Age of Onset  . Alzheimer's disease Mother   . Heart attack Father   . Heart disease Father   . Lymphoma Sister   . Heart failure Brother      Past  Surgical History:  Procedure Laterality Date  . CORONARY STENT PLACEMENT      Social History   Socioeconomic History  . Marital status: Widowed    Spouse name: Not on file  . Number of children: Not on file  . Years of education: Not on file  . Highest education level: Not on file  Occupational History  . Not on file  Tobacco Use  . Smoking status: Former Smoker    Packs/day: 1.00    Years: 2.00    Pack years: 2.00    Types: Cigarettes    Quit date: 11/15/1969    Years since quitting: 50.1  . Smokeless tobacco: Never Used  Substance and Sexual Activity  . Alcohol use: Never  . Drug use: Never  . Sexual activity: Not on file  Other Topics Concern  . Not on file  Social History Narrative  . Not on file   Social Determinants of Health   Financial Resource Strain:   . Difficulty of Paying Living Expenses:   Food Insecurity:   . Worried About Charity fundraiser in the Last Year:   . Arboriculturist in the Last Year:   Transportation Needs:   . Film/video editor (Medical):   Marland Kitchen Lack of Transportation (Non-Medical):   Physical Activity:   . Days of Exercise per Week:   . Minutes of Exercise per Session:  Stress:   . Feeling of Stress :   Social Connections:   . Frequency of Communication with Friends and Family:   . Frequency of Social Gatherings with Friends and Family:   . Attends Religious Services:   . Active Member of Clubs or Organizations:   . Attends Archivist Meetings:   Marland Kitchen Marital Status:   Intimate Partner Violence:   . Fear of Current or Ex-Partner:   . Emotionally Abused:   Marland Kitchen Physically Abused:   . Sexually Abused:      Allergies  Allergen Reactions  . Lisinopril     cough     Outpatient Medications Prior to Visit  Medication Sig Dispense Refill  . Biotin 5000 MCG TABS Take 5,000 mcg by mouth daily.    . Cholecalciferol (VITAMIN D3 SUPER STRENGTH) 50 MCG (2000 UT) TABS Take 2,000 Units by mouth daily.    . clopidogrel  (PLAVIX) 75 MG tablet Take 75 mg by mouth 3 (three) times a week. Take Mon, Wed, Fri    . levothyroxine (SYNTHROID) 25 MCG tablet Take 25 mcg by mouth daily before breakfast.    . losartan (COZAAR) 100 MG tablet Take 100 mg by mouth every evening.    . Metoprolol Tartrate 75 MG TABS Take 75 mg by mouth daily.     . pioglitazone (ACTOS) 15 MG tablet Take 15 mg by mouth daily.    . rosuvastatin (CRESTOR) 40 MG tablet Take 40 mg by mouth daily.    . sertraline (ZOLOFT) 100 MG tablet Take 100 mg by mouth at bedtime.    Marland Kitchen atorvastatin (LIPITOR) 80 MG tablet Take 80 mg by mouth every evening.     No facility-administered medications prior to visit.    Review of Systems  Constitutional: Negative for chills, fever, malaise/fatigue and weight loss.  HENT: Negative for hearing loss, sore throat and tinnitus.   Eyes: Negative for blurred vision and double vision.  Respiratory: Positive for shortness of breath. Negative for cough, hemoptysis, sputum production, wheezing and stridor.   Cardiovascular: Negative for chest pain, palpitations, orthopnea, leg swelling and PND.  Gastrointestinal: Negative for abdominal pain, constipation, diarrhea, heartburn, nausea and vomiting.  Genitourinary: Negative for dysuria, hematuria and urgency.  Musculoskeletal: Negative for joint pain and myalgias.  Skin: Negative for itching and rash.  Neurological: Negative for dizziness, tingling, weakness and headaches.  Endo/Heme/Allergies: Negative for environmental allergies. Does not bruise/bleed easily.  Psychiatric/Behavioral: Negative for depression. The patient is not nervous/anxious and does not have insomnia.   All other systems reviewed and are negative.    Objective:  Physical Exam Vitals reviewed.  Constitutional:      General: She is not in acute distress.    Appearance: She is well-developed.     Comments: Thin, elderly, frail  HENT:     Head: Normocephalic and atraumatic.  Eyes:     General: No  scleral icterus.    Conjunctiva/sclera: Conjunctivae normal.     Pupils: Pupils are equal, round, and reactive to light.  Neck:     Vascular: No JVD.     Trachea: No tracheal deviation.  Cardiovascular:     Rate and Rhythm: Normal rate and regular rhythm.     Heart sounds: Normal heart sounds.  Pulmonary:     Effort: Pulmonary effort is normal. No tachypnea, accessory muscle usage or respiratory distress.     Breath sounds: Normal breath sounds. No stridor. No wheezing, rhonchi or rales.  Abdominal:     General:  Bowel sounds are normal. There is no distension.     Palpations: Abdomen is soft.     Tenderness: There is no abdominal tenderness.  Musculoskeletal:        General: No tenderness.     Cervical back: Neck supple.  Lymphadenopathy:     Cervical: No cervical adenopathy.  Skin:    General: Skin is warm and dry.     Capillary Refill: Capillary refill takes less than 2 seconds.     Findings: No rash.  Neurological:     Mental Status: She is alert and oriented to person, place, and time.  Psychiatric:        Behavior: Behavior normal.      Vitals:   12/13/19 1541  BP: 116/64  Pulse: (!) 57  SpO2: 99%  Weight: 114 lb 6.4 oz (51.9 kg)  Height: 5\' 7"  (1.702 m)   99% on RA BMI Readings from Last 3 Encounters:  12/13/19 17.92 kg/m  11/25/19 18.17 kg/m  11/16/19 17.89 kg/m   Wt Readings from Last 3 Encounters:  12/13/19 114 lb 6.4 oz (51.9 kg)  11/25/19 116 lb (52.6 kg)  11/16/19 114 lb 3.2 oz (51.8 kg)     CBC No results found for: WBC, RBC, HGB, HCT, PLT, MCV, MCH, MCHC, RDW, LYMPHSABS, MONOABS, EOSABS, BASOSABS    Results for Karina, Pitts (MRN [id number]) as of 12/13/2019 17:03  Ref. Range 11/16/2019 11:34  Color, Fluid Latest Ref Range: YELLOW  YELLOW  Total Nucleated Cell Count, Fluid Latest Ref Range: 0 - 1,000 cu mm 1,988 (H)  Lymphs, Fluid Latest Units: % 26  Eos, Fluid Latest Units: % 66  Appearance, Fluid Latest Ref Range: CLEAR  HAZY (A)    Other Cells, Fluid Latest Units: % CORRELATE WITH CYTOLOGY.  Neutrophil Count, Fluid Latest Ref Range: 0 - 25 % 1  Monocyte-Macrophage-Serous Fluid Latest Ref Range: 50 - 90 % 7 (L)    Chest Imaging: 11/16/2019: Chest x-ray Left-sided loculated effusion.  Chest drain in place looks like comes and over top of the rib and then down and then extends up into the chest. The patient's images have been independently reviewed by me.    12/13/2019 chest x-ray: Small loculated left-sided effusion, IVC in place unchanged. The patient's images have been independently reviewed by me.    Pulmonary Functions Testing Results: No flowsheet data found.     Assessment & Plan:     ICD-10-CM   1. Recurrent left pleural effusion  J90   2. Chest tube in place  Z96.89     Discussion:  86 year old female, recurrent left-sided effusion, history of IPC placement, still currently draining approximately 300 cc every other day 3 of manual siphon IPC device. Patient's pleural fluid analysis is relatively benign except for 66% eosinophils on differential. I suspect this is from history of trauma blood and air introduction also history of pneumothorax for the past several months multiple thoracentesis.  Plan: Chest x-ray reviewed today in the office still with pleural fluid collection on the left. Medication list reviewed to see potential for drug reaction with eosinophil counts. No infectious etiology or symptoms at this time. We will give steroid Dosepak to see if this improves drainage and decreases inflammation. Patient to return to clinic in 4 to 6 weeks.  Greater than 50% of this patient's 32-minute office was spent face-to-face discussing the recommendations and treatment plan.   Current Outpatient Medications:  .  Biotin 5000 MCG TABS, Take 5,000 mcg by mouth daily.,  Disp: , Rfl:  .  Cholecalciferol (VITAMIN D3 SUPER STRENGTH) 50 MCG (2000 UT) TABS, Take 2,000 Units by mouth daily., Disp: , Rfl:  .   clopidogrel (PLAVIX) 75 MG tablet, Take 75 mg by mouth 3 (three) times a week. Take Mon, Wed, Fri, Disp: , Rfl:  .  levothyroxine (SYNTHROID) 25 MCG tablet, Take 25 mcg by mouth daily before breakfast., Disp: , Rfl:  .  losartan (COZAAR) 100 MG tablet, Take 100 mg by mouth every evening., Disp: , Rfl:  .  Metoprolol Tartrate 75 MG TABS, Take 75 mg by mouth daily. , Disp: , Rfl:  .  pioglitazone (ACTOS) 15 MG tablet, Take 15 mg by mouth daily., Disp: , Rfl:  .  rosuvastatin (CRESTOR) 40 MG tablet, Take 40 mg by mouth daily., Disp: , Rfl:  .  sertraline (ZOLOFT) 100 MG tablet, Take 100 mg by mouth at bedtime., Disp: , Rfl:     Garner Nash, DO [facility] 12/13/2019 4:03 PM

## 2019-12-13 NOTE — Progress Notes (Signed)
Cardiology Office Note:   Date:  12/14/2019  NAME:  [name]    MRN: [id number] DOB:  1931-10-30   PCP:  Michael Boston, MD  Cardiologist:  Evalina Field, MD   Referring MD: Michael Boston, MD   Chief Complaint  Patient presents with  . Coronary Artery Disease   History of Present Illness:   [name] is a 86 y.o. female with a hx of CAD, PAD, hypertension, hyperlipidemia who is being seen today for the evaluation of CAD at the request of Wile, Jesse Sans, MD.  She recently relocated from [place] to [place] to live with her daughter.  She has a history of myocardial infarction with PCI performed in 2007 2012.  She saw Dr. Baltazar Najjar in [facility].  She will have the records faxed to [place].  She has a history of right carotid enterectomy performed in [place] as well.  She has been stable from a cardiovascular disease standpoint.  She is not that active but reports no chest pain or shortness of breath.  Main activities are walking around the house.  She is frail and there is fall risk reported by her daughter.  She remains on Plavix.  She was recently restarted on Crestor by her primary care physician.  She is on metoprolol tartrate twice per day.  She does have diabetes and lab work was checked by her primary care physician at [facility].  I do not have the labs today.  She has had a recurrent pleural effusion.  The thought is that this is related to trauma.  She does have a Pleurx catheter in place.  She is working with pulmonology here.  She is denied pleurodesis.  She is a former smoker and does not consume alcohol.  Problem List 1. Recurrent L pleural effusion s/p pleurex catheter 2.  CAD status post stent in 2007/2012  3.  Hypertension 4.  Hyperlipidemia 5.  Arthritis 6.  Esophageal dilation 7.  Carotid artery stenosis status post right CEA  Past Medical History: Past Medical History:  Diagnosis Date  . Coronary  artery disease   . DM (diabetes mellitus) ([place])   . HTN (hypertension)   . Hyperlipidemia   . Myocardial infarct [facility]) 2012    Past Surgical History: Past Surgical History:  Procedure Laterality Date  . CARDIAC CATHETERIZATION    . CAROTID ENDARTERECTOMY    . CORONARY ANGIOPLASTY    . CORONARY STENT PLACEMENT      Current Medications: No outpatient medications have been marked as taking for the 12/14/19 encounter (Office Visit) with Geralynn Rile, MD.     Allergies:    Lisinopril   Social History: Social History   Socioeconomic History  . Marital status: Widowed    Spouse name: Not on file  . Number of children: Not on file  . Years of education: Not on file  . Highest education level: Not on file  Occupational History  . Not on file  Tobacco Use  . Smoking status: Former Smoker    Packs/day: 1.00    Years: 2.00    Pack years: 2.00    Types: Cigarettes    Quit date: 11/15/1969    Years since quitting: 50.1  . Smokeless tobacco: Never Used  Substance and Sexual Activity  . Alcohol use: Never  . Drug use: Never  . Sexual activity: Not on file  Other Topics Concern  . Not on file  Social History Narrative  . Not on file   Social Determinants of Health   Financial Resource Strain:   . Difficulty of Paying Living Expenses:   Food Insecurity:   . Worried About Charity fundraiser in the Last Year:   . Arboriculturist in the Last Year:   Transportation Needs:   . Film/video editor (Medical):   Marland Kitchen Lack of Transportation (Non-Medical):   Physical Activity:   . Days of Exercise per Week:   . Minutes of Exercise per Session:   Stress:   . Feeling of Stress :   Social Connections:   . Frequency of Communication with Friends and Family:   . Frequency of Social Gatherings with Friends and Family:   . Attends Religious Services:   . Active Member of Clubs or Organizations:   . Attends Archivist Meetings:   Marland Kitchen Marital Status:      Family  History: The patient's family history includes Alzheimer's disease in her mother; Heart attack in her father; Heart disease in her father; Heart failure in her brother; Lymphoma in her sister.  ROS:   All other ROS reviewed and negative. Pertinent positives noted in the HPI.     EKGs/Labs/Other Studies Reviewed:   The following studies were personally reviewed by me today:  EKG:  EKG is ordered today.  The ekg ordered today demonstrates normal sinus rhythm, heart rate 62, poor R wave progression noted, no acute ST-T changes, and was personally reviewed by me.   Recent Labs: No results found for requested labs within last 8760 hours.   Recent Lipid Panel No results found for: CHOL, TRIG, HDL, CHOLHDL, VLDL, LDLCALC, LDLDIRECT  Physical Exam:   VS:  BP 140/60 (BP Location: Left Arm, Patient Position: Sitting, Cuff Size: Normal)   Pulse 62   Ht 5\' 7"  (1.702 m)   Wt 114 lb (51.7 kg)   BMI 17.85 kg/m    Wt Readings from Last 3 Encounters:  12/14/19 114 lb (51.7 kg)  12/13/19 114 lb 6.4 oz (51.9 kg)  11/25/19 116 lb (52.6 kg)    General: Well nourished, well developed, in no acute distress Heart: Atraumatic, normal size  Eyes: PEERLA, EOMI  Neck: Supple, no JVD Endocrine: No thryomegaly Cardiac: Normal S1, S2; RRR; no murmurs, rubs, or gallops Lungs: Clear to auscultation bilaterally, no wheezing, rhonchi or rales  Abd: Soft, nontender, no hepatomegaly  Ext: No edema, pulses 2+ Musculoskeletal: No deformities, BUE and BLE strength normal and equal Skin: Warm and dry, no rashes   Neuro: Alert and oriented to person, place, time, and situation, CNII-XII grossly intact, no focal deficits  Psych: Normal mood and affect   ASSESSMENT:   [name] is a 86 y.o. female who presents for the following: 1. Coronary artery disease involving native coronary artery of native heart without angina pectoris   2. Essential hypertension   3. Mixed hyperlipidemia   4. Bilateral carotid  artery stenosis     PLAN:   1. Coronary artery disease involving native coronary artery of native heart without angina pectoris -She is status post PCI x2 in 2007 2012 [place].  I do not have the records.  Her daughter will obtain them and send them to [place].  She is on Plavix and not aspirin.  For now we will continue her Plavix.  We will also continue her Crestor.  She is on a beta-blocker metoprolol tartrate 70 mg twice a day.  We will continue this for now.  I will see her back in 6 months with records.  We will order any necessary testing that time.  She is relatively stable and without symptoms of angina today.  2. Essential hypertension -Well-controlled we will continue her metoprolol.  3. Mixed hyperlipidemia -She will continue her Crestor 40 mg daily.  I will recheck a lipid profile when I see her back in 6 months.  4. Bilateral carotid artery stenosis -She is status post right carotid enterectomy.  She reports that the left carotid artery was being followed by her vascular surgeon in [place].  I do not have the results of that.  We will get those results and then determine how periodically she need ultrasounds.   Disposition: Return in about 6 months (around 06/15/2020).  Medication Adjustments/Labs and Tests Ordered: Current medicines are reviewed at length with the patient today.  Concerns regarding medicines are outlined above.  Orders Placed This Encounter  Procedures  . EKG 12-Lead   No orders of the defined types were placed in this encounter.   Patient Instructions  Medication Instructions:  The current medical regimen is effective;  continue present plan and medications.  *If you need a refill on your cardiac medications before your next appointment, please call your pharmacy*   Follow-Up: At [facility], you and your health needs are our priority.  As part of our continuing mission to provide you with exceptional heart care, we have created  designated Provider Care Teams.  These Care Teams include your primary Cardiologist (physician) and Advanced Practice Providers (APPs -  Physician Assistants and Nurse Practitioners) who all work together to provide you with the care you need, when you need it.  We recommend signing up for the patient portal called "MyChart".  Sign up information is provided on this After Visit Summary.  MyChart is used to connect with patients for Virtual Visits (Telemedicine).  Patients are able to view lab/test results, encounter notes, upcoming appointments, etc.  Non-urgent messages can be sent to your provider as well.   To learn more about what you can do with MyChart, go to [email or web address].    Your next appointment:   6 month(s) (come fasting, nothing to eat or drink)  The format for your next appointment:   In Person  Provider:   Eleonore Chiquito, MD       Signed, Addison Naegeli. Audie Box, Rebecca  [address] [place], [place] [address] [phone]  12/14/2019 4:45 PM

## 2019-12-14 ENCOUNTER — Encounter: Payer: Self-pay | Admitting: Cardiovascular Disease

## 2019-12-14 ENCOUNTER — Ambulatory Visit (INDEPENDENT_AMBULATORY_CARE_PROVIDER_SITE_OTHER): Payer: Medicare Other | Admitting: Cardiovascular Disease

## 2019-12-14 VITALS — BP 140/60 | HR 62 | Ht 67.0 in | Wt 114.0 lb

## 2019-12-14 DIAGNOSIS — I6523 Occlusion and stenosis of bilateral carotid arteries: Secondary | ICD-10-CM

## 2019-12-14 DIAGNOSIS — E782 Mixed hyperlipidemia: Secondary | ICD-10-CM | POA: Diagnosis not present

## 2019-12-14 DIAGNOSIS — I10 Essential (primary) hypertension: Secondary | ICD-10-CM

## 2019-12-14 DIAGNOSIS — I2510 Atherosclerotic heart disease of native coronary artery without angina pectoris: Secondary | ICD-10-CM

## 2019-12-14 NOTE — Patient Instructions (Signed)
Medication Instructions:  The current medical regimen is effective;  continue present plan and medications.  *If you need a refill on your cardiac medications before your next appointment, please call your pharmacy*   Follow-Up: At [facility], you and your health needs are our priority.  As part of our continuing mission to provide you with exceptional heart care, we have created designated Provider Care Teams.  These Care Teams include your primary Cardiologist (physician) and Advanced Practice Providers (APPs -  Physician Assistants and Nurse Practitioners) who all work together to provide you with the care you need, when you need it.  We recommend signing up for the patient portal called "MyChart".  Sign up information is provided on this After Visit Summary.  MyChart is used to connect with patients for Virtual Visits (Telemedicine).  Patients are able to view lab/test results, encounter notes, upcoming appointments, etc.  Non-urgent messages can be sent to your provider as well.   To learn more about what you can do with MyChart, go to [email or web address].    Your next appointment:   6 month(s) (come fasting, nothing to eat or drink)  The format for your next appointment:   In Person  Provider:   Eleonore Chiquito, MD

## 2019-12-20 ENCOUNTER — Encounter (HOSPITAL_BASED_OUTPATIENT_CLINIC_OR_DEPARTMENT_OTHER): Payer: Medicare Other | Attending: Physician Assistant | Admitting: Physician Assistant

## 2019-12-20 ENCOUNTER — Other Ambulatory Visit: Payer: Self-pay

## 2019-12-20 DIAGNOSIS — L97822 Non-pressure chronic ulcer of other part of left lower leg with fat layer exposed: Secondary | ICD-10-CM | POA: Insufficient documentation

## 2019-12-20 DIAGNOSIS — I10 Essential (primary) hypertension: Secondary | ICD-10-CM | POA: Diagnosis not present

## 2019-12-20 DIAGNOSIS — S81802A Unspecified open wound, left lower leg, initial encounter: Secondary | ICD-10-CM | POA: Insufficient documentation

## 2019-12-20 DIAGNOSIS — M1990 Unspecified osteoarthritis, unspecified site: Secondary | ICD-10-CM | POA: Insufficient documentation

## 2019-12-20 DIAGNOSIS — Z8673 Personal history of transient ischemic attack (TIA), and cerebral infarction without residual deficits: Secondary | ICD-10-CM | POA: Insufficient documentation

## 2019-12-20 DIAGNOSIS — I252 Old myocardial infarction: Secondary | ICD-10-CM | POA: Insufficient documentation

## 2019-12-20 DIAGNOSIS — Z7901 Long term (current) use of anticoagulants: Secondary | ICD-10-CM | POA: Diagnosis not present

## 2019-12-20 DIAGNOSIS — E11622 Type 2 diabetes mellitus with other skin ulcer: Secondary | ICD-10-CM | POA: Diagnosis not present

## 2019-12-20 DIAGNOSIS — I2510 Atherosclerotic heart disease of native coronary artery without angina pectoris: Secondary | ICD-10-CM | POA: Diagnosis not present

## 2019-12-20 DIAGNOSIS — X58XXXA Exposure to other specified factors, initial encounter: Secondary | ICD-10-CM | POA: Insufficient documentation

## 2019-12-20 NOTE — Progress Notes (Signed)
[name], [name] ([id number]) Visit Report for 12/20/2019 Abuse/Suicide Risk Screen Details Patient Name: Date of Service: [name], [name] 12/20/2019 2:45 PM Medical Record [id number] Patient Account Number: [id number] Date of Birth/Sex: Treating RN: 1931-12-21 (86 y.o. [name] Primary Care Maymunah Stegemann: Cristie Hem Other Clinician: Referring Wiatt Mahabir: Treating Nellie Chevalier/Extender:Stone III, Wallie Char, LAURA Weeks in Treatment: 0 Abuse/Suicide Risk Screen Items Answer ABUSE RISK SCREEN: Has anyone close to you tried to hurt or harm you recentlyo No Do you feel uncomfortable with anyone in your familyo No Has anyone forced you do things that you didnt want to doo No Electronic Signature(s) Signed: 12/20/2019 5:08:19 PM By: Deon Pilling Entered By: Deon Pilling on 12/20/2019 15:43:30 -------------------------------------------------------------------------------- Activities of Daily Living Details Patient Name: Date of Service: [name], [name] 12/20/2019 2:45 PM Medical Record [id number] Patient Account Number: [id number] Date of Birth/Sex: Treating RN: 12-12-1931 (86 y.o. [name] Primary Care Avleen Bordwell: Cristie Hem Other Clinician: Referring Janecia Palau: Treating Rejina Odle/Extender:Stone III, Wallie Char, LAURA Weeks in Treatment: 0 Activities of Daily Living Items Answer Activities of Daily Living (Please select one for each item) Drive Automobile Completely Able Take Medications Completely Able Use Telephone Completely Able Care for Appearance Completely Able Use Toilet Completely Able Bath / Shower Completely Able Dress Self Completely Able Feed Self Completely Able Walk Need Assistance Get In / Out Bed Completely Able Housework Completely Able Prepare Meals Completely [place] for Self Completely Able Electronic Signature(s) Signed: 12/20/2019 5:08:19 PM By: Deon Pilling Entered By: Deon Pilling on 12/20/2019  15:43:49 -------------------------------------------------------------------------------- Education Screening Details Patient Name: Date of Service: [name], [name] 12/20/2019 2:45 PM Medical Record [id number] Patient Account Number: [id number] Date of Birth/Sex: Treating RN: 1932/08/12 (86 y.o. [name] Primary Care Emmalynne Courtney: Cristie Hem Other Clinician: Referring Rolla Kedzierski: Treating Jonasia Coiner/Extender:Stone III, Wallie Char, LAURA Weeks in Treatment: 0 Primary Learner Assessed: Patient Learning Preferences/Education Level/Primary Language Learning Preference: Explanation, Demonstration, Printed Material Highest Education Level: High School Preferred Language: English Cognitive Barrier Language Barrier: No Translator Needed: No Memory Deficit: No Emotional Barrier: No Cultural/Religious Beliefs Affecting Medical Care: No Physical Barrier Impaired Vision: Yes Glasses Impaired Hearing: No Decreased Hand dexterity: No Knowledge/Comprehension Knowledge Level: High Comprehension Level: High Ability to understand written High instructions: Ability to understand verbal High instructions: Motivation Anxiety Level: Calm Cooperation: Cooperative Education Importance: Acknowledges Need Interest in Health Problems: Asks Questions Perception: Coherent Willingness to Engage in Self- High Management Activities: Readiness to Engage in Self- High Management Activities: Electronic Signature(s) Signed: 12/20/2019 5:08:19 PM By: Deon Pilling Entered By: Deon Pilling on 12/20/2019 15:44:13 -------------------------------------------------------------------------------- Fall Risk Assessment Details Patient Name: Date of Service: [name], [name] 12/20/2019 2:45 PM Medical Record [id number] Patient Account Number: [id number] Date of Birth/Sex: Treating RN: 02/18/1932 (86 y.o. Helene Shoe, Meta.Reding Primary Care Lynniah Janoski: Cristie Hem Other Clinician: Referring  Maezie Justin: Treating Kinzey Sheriff/Extender:Stone III, Wallie Char, LAURA Weeks in Treatment: 0 Fall Risk Assessment Items Have you had 2 or more falls in the last 12 monthso 0 Yes Have you had any fall that resulted in injury in the last 12 monthso 0 Yes FALLS RISK SCREEN History of falling - immediate or within 3 months 25 Yes Secondary diagnosis (Do you have 2 or more medical diagnoseso) 0 No Ambulatory aid None/bed rest/wheelchair/nurse 0 No Crutches/cane/walker 15 Yes Furniture 0 No Intravenous therapy Access/Saline/Heparin Lock 0 No Weak (short steps with or without shuffle, stooped but able to lift head 10 Yes while walking, may seek support from furniture) Impaired (short steps with shuffle, may have difficulty  arising from chair, 0 No head down, impaired balance) Mental Status Oriented to own ability 0 Yes Overestimates or forgets limitations 0 No Risk Level: Medium Risk Score: 50 Electronic Signature(s) Signed: 12/20/2019 5:08:19 PM By: Deon Pilling Entered By: Deon Pilling on 12/20/2019 15:44:37 -------------------------------------------------------------------------------- Foot Assessment Details Patient Name: Date of Service: [name], [name] 12/20/2019 2:45 PM Medical Record [id number] Patient Account Number: [id number] Date of Birth/Sex: Treating RN: 1932/09/19 (86 y.o. [name] Primary Care Lateisha Thurlow: Cristie Hem Other Clinician: Referring Audryanna Zurita: Treating Vyom Brass/Extender:Stone III, Wallie Char, LAURA Weeks in Treatment: 0 Foot Assessment Items Site Locations + = Sensation present, - = Sensation absent, C = Callus, U = Ulcer R = Redness, W = Warmth, M = Maceration, PU = Pre-ulcerative lesion F = Fissure, S = Swelling, D = Dryness Assessment Right: Left: Other Deformity: No No Prior Foot Ulcer: No No Prior Amputation: No No Charcot Joint: No No Ambulatory Status: Ambulatory With Help Assistance Device: Cane Gait: Steady Electronic  Signature(s) Signed: 12/20/2019 5:08:19 PM By: Deon Pilling Entered By: Deon Pilling on 12/20/2019 16:03:22 -------------------------------------------------------------------------------- Nutrition Risk Screening Details Patient Name: Date of Service: [name], [name] 12/20/2019 2:45 PM Medical Record [id number] Patient Account Number: [id number] Date of Birth/Sex: Treating RN: 05/14/1932 (86 y.o. [name] Primary Care Charvis Lightner: Cristie Hem Other Clinician: Referring Pierre Dellarocco: Treating Camil Hausmann/Extender:Stone III, Wallie Char, LAURA Weeks in Treatment: 0 Height (in): Weight (lbs): Body Mass Index (BMI): Nutrition Risk Screening Items Score Screening NUTRITION RISK SCREEN: I have an illness or condition that made me change the kind and/or 2 Yes amount of food I eat I eat fewer than two meals per day 0 No I eat few fruits and vegetables, or milk products 0 No I have three or more drinks of beer, liquor or wine almost every day 0 No I have tooth or mouth problems that make it hard for me to eat 0 No I don't always have enough money to buy the food I need 0 No I eat alone most of the time 0 No I take three or more different prescribed or over-the-counter drugs a day 1 Yes 0 No Without wanting to, I have lost or gained 10 pounds in the last six months I am not always physically able to shop, cook and/or feed myself 0 No Nutrition Protocols Good Risk Protocol Provide education on elevated blood sugars and Moderate Risk Protocol 0 impact on wound healing, as applicable High Risk Proctocol Risk Level: Moderate Risk Score: 3 Electronic Signature(s) Signed: 12/20/2019 5:08:19 PM By: Deon Pilling Entered By: Deon Pilling on 12/20/2019 15:44:49

## 2019-12-20 NOTE — Progress Notes (Signed)
[name], [name] ([id number]) Visit Report for 12/20/2019 Chief Complaint Document Details Patient Name: Date of Service: ROSSELLA, [name] 12/20/2019 2:45 PM Medical Record [id number] Patient Account Number: [id number] Date of Birth/Sex: Treating RN: 04-26-1932 (86 y.o. [name] Primary Care Provider: Cristie Hem Other Clinician: Referring Provider: Treating Provider/Extender:Stone III, Wallie Char, LAURA Weeks in Treatment: 0 Information Obtained from: Patient Chief Complaint Left LE skin tears Electronic Signature(s) Signed: 12/20/2019 5:00:41 PM By: Worthy Keeler PA-C Entered By: Worthy Keeler on 12/20/2019 17:00:41 -------------------------------------------------------------------------------- Debridement Details Patient Name: Date of Service: [name], [name] 12/20/2019 2:45 PM Medical Record [id number] Patient Account Number: [id number] Date of Birth/Sex: Treating RN: 1932-04-12 (86 y.o. [name] Primary Care Provider: Cristie Hem Other Clinician: Referring Provider: Treating Provider/Extender:Stone III, Wallie Char, LAURA Weeks in Treatment: 0 Debridement Performed for Wound #1 Left,Lateral Lower Leg Assessment: Performed By: Physician Worthy Keeler, PA Debridement Type: Debridement Severity of Tissue Pre Fat layer exposed Debridement: Level of Consciousness (Pre- Awake and Alert procedure): Pre-procedure Verification/Time Out Taken: Yes - 17:05 Start Time: 17:06 Pain Control: Other : benzocaine 20% spray Total Area Debrided (L x W): 2.1 (cm) x 0.4 (cm) = 0.84 (cm) Tissue and other material Non-Viable, Skin: Dermis , Skin: Epidermis debrided: Level: Skin/Epidermis Debridement Description: Selective/Open Wound Instrument: Forceps, Scissors Bleeding: Minimum Hemostasis Achieved: Pressure Procedural Pain: 4 Post Procedural Pain: 1 Response to Treatment: Procedure was tolerated well Level of Consciousness Awake and  Alert (Post-procedure): Post Debridement Measurements of Total Wound Length: (cm) 2.1 Width: (cm) 0.4 Depth: (cm) 0.1 Volume: (cm) 0.066 Character of Wound/Ulcer Post Improved Debridement: Severity of Tissue Post Debridement: Fat layer exposed Post Procedure Diagnosis Same as Pre-procedure Electronic Signature(s) Signed: 12/20/2019 6:50:57 PM By: Baruch Gouty RN, BSN Signed: 12/20/2019 10:08:08 PM By: Worthy Keeler PA-C Entered By: Baruch Gouty on 12/20/2019 17:11:11 -------------------------------------------------------------------------------- Debridement Details Patient Name: Date of Service: [name], [name] 12/20/2019 2:45 PM Medical Record [id number] Patient Account Number: [id number] Date of Birth/Sex: Treating RN: May 23, 1932 (86 y.o. [name] Primary Care Provider: Cristie Hem Other Clinician: Referring Provider: Treating Provider/Extender:Stone III, Wallie Char, LAURA Weeks in Treatment: 0 Debridement Performed for Wound #2 Left,Proximal,Anterior Lower Leg Assessment: Performed By: Physician Worthy Keeler, PA Debridement Type: Debridement Severity of Tissue Pre Fat layer exposed Debridement: Level of Consciousness (Pre- Awake and Alert procedure): Pre-procedure Verification/Time Out Taken: Yes - 17:05 Start Time: 17:06 Pain Control: Other : benzocaine 20% spray Total Area Debrided (L x W): 3.7 (cm) x 0.5 (cm) = 1.85 (cm) Tissue and other material Non-Viable, Skin: Dermis , Skin: Epidermis debrided: Level: Skin/Epidermis Debridement Description: Selective/Open Wound Instrument: Forceps, Scissors Bleeding: Minimum Hemostasis Achieved: Pressure End Time: 17:11 Procedural Pain: 4 Post Procedural Pain: 1 Response to Treatment: Procedure was tolerated well Level of Consciousness Awake and Alert (Post-procedure): Post Debridement Measurements of Total Wound Length: (cm) 3.7 Width: (cm) 2.5 Depth: (cm) 0.1 Volume: (cm)  0.726 Character of Wound/Ulcer Post Improved Debridement: Severity of Tissue Post Debridement: Fat layer exposed Post Procedure Diagnosis Same as Pre-procedure Electronic Signature(s) Signed: 12/20/2019 6:50:57 PM By: Baruch Gouty RN, BSN Signed: 12/20/2019 10:08:08 PM By: Worthy Keeler PA-C Entered By: Baruch Gouty on 12/20/2019 17:11:56 -------------------------------------------------------------------------------- Debridement Details Patient Name: Date of Service: [name], [name] 12/20/2019 2:45 PM Medical Record [id number] Patient Account Number: [id number] Date of Birth/Sex: Treating RN: 1932/07/16 (86 y.o. [name] Primary Care Provider: Cristie Hem Other Clinician: Referring Provider: Treating Provider/Extender:Stone III, Wallie Char, LAURA Weeks in Treatment: 0 Debridement Performed for Wound #  3 Left,Distal,Anterior Lower Leg Assessment: Performed By: Physician Worthy Keeler, PA Debridement Type: Debridement Severity of Tissue Pre Fat layer exposed Debridement: Level of Consciousness (Pre- Awake and Alert procedure): Pre-procedure Verification/Time Out Taken: Yes - 17:05 Start Time: 17:06 Pain Control: Other : benzocaine 20% spray Total Area Debrided (L x W): 3 (cm) x 1 (cm) = 3 (cm) Tissue and other material Non-Viable, Skin: Epidermis, Fibrin/Exudate Non-Viable, Skin: Epidermis, Fibrin/Exudate debrided: Level: Skin/Epidermis Debridement Description: Selective/Open Wound Instrument: Forceps, Scissors Bleeding: Minimum Hemostasis Achieved: Pressure End Time: 17:11 Procedural Pain: 4 Post Procedural Pain: 1 Response to Treatment: Procedure was tolerated well Level of Consciousness Awake and Alert (Post-procedure): Post Debridement Measurements of Total Wound Length: (cm) 7.5 Width: (cm) 2.5 Depth: (cm) 0.1 Volume: (cm) 1.473 Character of Wound/Ulcer Post Improved Debridement: Severity of Tissue Post Debridement: Fat layer  exposed Post Procedure Diagnosis Same as Pre-procedure Electronic Signature(s) Signed: 12/20/2019 6:50:57 PM By: Baruch Gouty RN, BSN Signed: 12/20/2019 10:08:08 PM By: Worthy Keeler PA-C Entered By: Baruch Gouty on 12/20/2019 17:12:57 -------------------------------------------------------------------------------- HPI Details Patient Name: Date of Service: [name], [name] 12/20/2019 2:45 PM Medical Record [id number] Patient Account Number: [id number] Date of Birth/Sex: Treating RN: [birth year] (86 y.o. [name] Primary Care Provider: Cristie Hem Other Clinician: Referring Provider: Treating Provider/Extender:Stone III, Wallie Char, LAURA Weeks in Treatment: 0 History of Present Illness HPI Description: 12/20/2019 patient presents today for initial evaluation here in our clinic concerning issues that she unfortunately has been having with skin tears over the right lower extremity. This happened secondary to a fall that she had initially in December causing some skin tears that for the most part healed and then she sustained a second fall in February 2021. Subsequently the patient did have skin tears which they have been trying to manage at this point and home health has not really been put anything on it as they are waiting for orders from [place]. As far as the daughter is concerned with the patient she has been using Vaseline over the area at this point to try to keep the area moist and nothing would stick to it. The patient is having discomfort unfortunately. Nonetheless I do not see any signs of active infection which is great news. She does have a history of diabetes mellitus type 2, hypertension, and is on anticoagulant therapy due to history of stroke. Electronic Signature(s) Signed: 12/20/2019 9:58:53 PM By: Worthy Keeler PA-C Entered By: Worthy Keeler on 12/20/2019  21:58:52 -------------------------------------------------------------------------------- Physical Exam Details Patient Name: Date of Service: [name], [name] 12/20/2019 2:45 PM Medical Record [id number] Patient Account Number: [id number] Date of Birth/Sex: Treating RN: Nov 28, 1931 (86 y.o. [name] Primary Care Provider: Cristie Hem Other Clinician: Referring Provider: Treating Provider/Extender:Stone III, Wallie Char, LAURA Weeks in Treatment: 0 Constitutional patient is hypertensive.. pulse regular and within target range for patient.Marland Kitchen respirations regular, non-labored and within target range for patient.Marland Kitchen temperature within target range for patient.. Well-nourished and well-hydrated in no acute distress. Eyes conjunctiva clear no eyelid edema noted. pupils equal round and reactive to light and accommodation. Ears, Nose, Mouth, and Throat no gross abnormality of ear auricles or external auditory canals. normal hearing noted during conversation. mucus membranes moist. Respiratory normal breathing without difficulty. clear to auscultation bilaterally. Cardiovascular regular rate and rhythm with normal S1, S2. Musculoskeletal normal gait and posture. Psychiatric this patient is able to make decisions and demonstrates good insight into disease process. Alert and Oriented x 3. pleasant and cooperative. Notes Patient's wound bed currently  showed signs of good granulation for the most part although she did have areas where the skin had unfortunately been pulled back quite a bit. This is going need to be trimmed away in order to help the area to heal effectively and quickly. Fortunately there is no signs of active infection at this time. No fevers, chills, nausea, vomiting, or diarrhea. Electronic Signature(s) Signed: 12/20/2019 10:01:00 PM By: Worthy Keeler PA-C Entered By: Worthy Keeler on 12/20/2019  22:01:00 -------------------------------------------------------------------------------- Physician Orders Details Patient Name: Date of Service: [name], [name] 12/20/2019 2:45 PM Medical Record [id number] Patient Account Number: [id number] Date of Birth/Sex: Treating RN: 06-23-1932 (86 y.o. [name] Primary Care Provider: Cristie Hem Other Clinician: Referring Provider: Treating Provider/Extender:Stone III, Wallie Char, LAURA Weeks in Treatment: 0 Verbal / Phone Orders: No Diagnosis Coding ICD-10 Coding Code Description S81.802A Unspecified open wound, left lower leg, initial encounter L97.822 Non-pressure chronic ulcer of other part of left lower leg with fat layer exposed E11.622 Type 2 diabetes mellitus with other skin ulcer I10 Essential (primary) hypertension Z79.01 Long term (current) use of anticoagulants Follow-up Appointments Return Appointment in 1 week. Dressing Change Frequency Wound #1 Left,Lateral Lower Leg Change dressing every day. Wound #2 Left,Proximal,Anterior Lower Leg Change dressing every day. Wound #3 Left,Distal,Anterior Lower Leg Change dressing every day. Wound Cleansing Clean wound with Wound Cleanser - all wounds May shower and wash wound with soap and water. Primary Wound Dressing Wound #1 Left,Lateral Lower Leg Xeroform Wound #2 Left,Proximal,Anterior Lower Leg Xeroform Wound #3 Left,Distal,Anterior Lower Leg Xeroform Secondary Dressing Wound #1 Left,Lateral Lower Leg Kerlix/Rolled Gauze ABD pad Wound #2 Left,Proximal,Anterior Lower Leg Kerlix/Rolled Gauze ABD pad Wound #3 Left,Distal,Anterior Lower Leg Kerlix/Rolled Gauze ABD pad [facility] for wound care. - [facility] Electronic Signature(s) Signed: 12/20/2019 6:50:57 PM By: Baruch Gouty RN, BSN Signed: 12/20/2019 10:08:08 PM By: Worthy Keeler PA-C Entered By: Baruch Gouty on 12/20/2019  17:15:24 -------------------------------------------------------------------------------- Problem List Details Patient Name: Date of Service: [name], [name] 12/20/2019 2:45 PM Medical Record [id number] Patient Account Number: [id number] Date of Birth/Sex: Treating RN: 20-Oct-1931 (86 y.o. [name] Primary Care Provider: Cristie Hem Other Clinician: Referring Provider: Treating Provider/Extender:Stone III, Wallie Char, LAURA Weeks in Treatment: 0 Active Problems ICD-10 Evaluated Encounter Code Description Active Date Today Diagnosis S81.802A Unspecified open wound, left lower leg, initial 12/20/2019 No Yes encounter L97.822 Non-pressure chronic ulcer of other part of left lower 12/20/2019 No Yes leg with fat layer exposed E11.622 Type 2 diabetes mellitus with other skin ulcer 12/20/2019 No Yes I10 Essential (primary) hypertension 12/20/2019 No Yes Z79.01 Long term (current) use of anticoagulants 12/20/2019 No Yes Inactive Problems Resolved Problems Electronic Signature(s) Signed: 12/20/2019 9:58:37 PM By: Worthy Keeler PA-C Previous Signature: 12/20/2019 4:59:23 PM Version By: Worthy Keeler PA-C Entered By: Worthy Keeler on 12/20/2019 21:58:36 -------------------------------------------------------------------------------- Progress Note Details Patient Name: Date of Service: [name], [name] 12/20/2019 2:45 PM Medical Record [id number] Patient Account Number: [id number] Date of Birth/Sex: Treating RN: 1932-02-02 (86 y.o. [name] Primary Care Provider: Cristie Hem Other Clinician: Referring Provider: Treating Provider/Extender:Stone III, Wallie Char, LAURA Weeks in Treatment: 0 Subjective Chief Complaint Information obtained from Patient Left LE skin tears History of Present Illness (HPI) 12/20/2019 patient presents today for initial evaluation here in our clinic concerning issues that she unfortunately has been having with skin tears over the  right lower extremity. This happened secondary to a fall that she had initially in December causing some skin tears  that for the most part healed and then she sustained a second fall in February 2021. Subsequently the patient did have skin tears which they have been trying to manage at this point and home health has not really been put anything on it as they are waiting for orders from [place]. As far as the daughter is concerned with the patient she has been using Vaseline over the area at this point to try to keep the area moist and nothing would stick to it. The patient is having discomfort unfortunately. Nonetheless I do not see any signs of active infection which is great news. She does have a history of diabetes mellitus type 2, hypertension, and is on anticoagulant therapy due to history of stroke. Patient History Information obtained from Patient. Allergies lisinopril (Reaction: cough) Family History Cancer - Siblings, Heart Disease - Father,Siblings, Hypertension - Father, No family history of Diabetes, Hereditary Spherocytosis, Kidney Disease, Lung Disease, Seizures, Stroke, Thyroid Problems, Tuberculosis. Social History Former smoker - quit 11/15/1969, Marital Status - Widowed, Alcohol Use - Never, Drug Use - No History, Caffeine Use - Never. Medical History Eyes Denies history of Cataracts, Glaucoma, Optic Neuritis Ear/Nose/Mouth/Throat Denies history of Chronic sinus problems/congestion, Middle ear problems Hematologic/Lymphatic Denies history of Anemia, Hemophilia, Human Immunodeficiency Virus, Lymphedema, Sickle Cell Disease Respiratory Denies history of Aspiration, Asthma, Chronic Obstructive Pulmonary Disease (COPD), Pneumothorax, Sleep Apnea, Tuberculosis Cardiovascular Patient has history of Coronary Artery Disease, Hypertension, Myocardial Infarction - 2007 Denies history of Angina, Arrhythmia, Congestive Heart Failure, Deep Vein Thrombosis, Hypotension,  Peripheral Arterial Disease, Peripheral Venous Disease, Phlebitis, Vasculitis Gastrointestinal Denies history of Cirrhosis , Colitis, Crohnoos, Hepatitis A, Hepatitis B, Hepatitis C Endocrine Patient has history of Type II Diabetes Denies history of Type I Diabetes Genitourinary Denies history of End Stage Renal Disease Immunological Denies history of Lupus Erythematosus, Raynaudoos, Scleroderma Integumentary (Skin) Denies history of History of Burn Musculoskeletal Patient has history of Osteoarthritis Denies history of Gout, Rheumatoid Arthritis, Osteomyelitis Neurologic Denies history of Dementia, Neuropathy, Quadriplegia, Paraplegia, Seizure Disorder Oncologic Denies history of Received Chemotherapy, Received Radiation Psychiatric Patient has history of Confinement Anxiety Denies history of Anorexia/bulimia Patient is treated with Controlled Diet, Oral Agents. Blood sugar is not tested. Hospitalization/Surgery History - 10/23/2019 chest tube placement [place]. - hysterectomy. - Left hip replacement. Medical And Surgical History Notes Respiratory 08/30/2019 fell with fx ribs 10/23/2019 pleural effusion with thoracentesis drain placed and currently draining. Review of Systems (ROS) Constitutional Symptoms (General Health) Denies complaints or symptoms of Fatigue, Fever, Chills, Marked Weight Change. Eyes Complains or has symptoms of Glasses / Contacts - glasses. Denies complaints or symptoms of Dry Eyes, Vision Changes. Ear/Nose/Mouth/Throat Denies complaints or symptoms of Chronic sinus problems or rhinitis. Respiratory Denies complaints or symptoms of Chronic or frequent coughs, Shortness of Breath. Cardiovascular Denies complaints or symptoms of Chest pain. Gastrointestinal Denies complaints or symptoms of Frequent diarrhea, Nausea, Vomiting. Genitourinary Denies complaints or symptoms of Frequent urination. Integumentary (Skin) Complains or has symptoms of Wounds -  currently left leg. Musculoskeletal Complains or has symptoms of Muscle Weakness. Denies complaints or symptoms of Muscle Pain. Neurologic Denies complaints or symptoms of Numbness/parasthesias. Psychiatric Complains or has symptoms of Claustrophobia. Denies complaints or symptoms of Suicidal. Objective Constitutional patient is hypertensive.. pulse regular and within target range for patient.Marland Kitchen respirations regular, non-labored and within target range for patient.Marland Kitchen temperature within target range for patient.. Well-nourished and well-hydrated in no acute distress. Vitals Time Taken: 3:40 PM, Height: 67 in, Source: Stated, Weight: 114 lbs, Source:  Stated, BMI: 17.9, Temperature: 98.4 F, Pulse: 54 bpm, Respiratory Rate: 18 breaths/min, Blood Pressure: 155/65 mmHg. Eyes conjunctiva clear no eyelid edema noted. pupils equal round and reactive to light and accommodation. Ears, Nose, Mouth, and Throat no gross abnormality of ear auricles or external auditory canals. normal hearing noted during conversation. mucus membranes moist. Respiratory normal breathing without difficulty. clear to auscultation bilaterally. Cardiovascular regular rate and rhythm with normal S1, S2. Musculoskeletal normal gait and posture. Psychiatric this patient is able to make decisions and demonstrates good insight into disease process. Alert and Oriented x 3. pleasant and cooperative. General Notes: Patient's wound bed currently showed signs of good granulation for the most part although she did have areas where the skin had unfortunately been pulled back quite a bit. This is going need to be trimmed away in order to help the area to heal effectively and quickly. Fortunately there is no signs of active infection at this time. No fevers, chills, nausea, vomiting, or diarrhea. Integumentary (Hair, Skin) Wound #1 status is Open. Original cause of wound was Trauma. The wound is located on the Left,Lateral  Lower Leg. The wound measures 2.1cm length x 0.4cm width x 0.1cm depth; 0.66cm^2 area and 0.066cm^3 volume. There is Fat Layer (Subcutaneous Tissue) Exposed exposed. There is no tunneling or undermining noted. There is a medium amount of serosanguineous drainage noted. The wound margin is distinct with the outline attached to the wound base. There is small (1-33%) red, pink granulation within the wound bed. There is a large (67-100%) amount of necrotic tissue within the wound bed including Adherent Slough. Wound #2 status is Open. Original cause of wound was Trauma. The wound is located on the Left,Proximal,Anterior Lower Leg. The wound measures 3.7cm length x 2.5cm width x 0.1cm depth; 7.265cm^2 area and 0.726cm^3 volume. There is Fat Layer (Subcutaneous Tissue) Exposed exposed. There is no tunneling or undermining noted. There is a medium amount of serosanguineous drainage noted. The wound margin is distinct with the outline attached to the wound base. There is small (1-33%) red, pink granulation within the wound bed. There is a large (67- 100%) amount of necrotic tissue within the wound bed including Adherent Slough. Wound #3 status is Open. Original cause of wound was Trauma. The wound is located on the [facility] Lower Leg. The wound measures 7.5cm length x 2.5cm width x 0.1cm depth; 14.726cm^2 area and 1.473cm^3 volume. There is Fat Layer (Subcutaneous Tissue) Exposed exposed. There is no tunneling or undermining noted. There is a medium amount of serosanguineous drainage noted. The wound margin is distinct with the outline attached to the wound base. There is small (1-33%) red, pink granulation within the wound bed. There is a large (67- 100%) amount of necrotic tissue within the wound bed including Adherent Slough. Assessment Active Problems ICD-10 Unspecified open wound, left lower leg, initial encounter Non-pressure chronic ulcer of other part of left lower leg with fat  layer exposed Type 2 diabetes mellitus with other skin ulcer Essential (primary) hypertension Long term (current) use of anticoagulants Procedures Wound #1 Pre-procedure diagnosis of Wound #1 is a Skin Tear located on the Left,Lateral Lower Leg .Severity of Tissue Pre Debridement is: Fat layer exposed. There was a Selective/Open Wound Skin/Epidermis Debridement with a total area of 0.84 sq cm performed by Worthy Keeler, PA. With the following instrument(s): Forceps, and Scissors to remove Non-Viable tissue/material. Material removed includes Skin: Dermis and Skin: Epidermis and after achieving pain control using Other (benzocaine 20% spray). No specimens were  taken. A time out was conducted at 17:05, prior to the start of the procedure. A Minimum amount of bleeding was controlled with Pressure. The procedure was tolerated well with a pain level of 4 throughout and a pain level of 1 following the procedure. Post Debridement Measurements: 2.1cm length x 0.4cm width x 0.1cm depth; 0.066cm^3 volume. Character of Wound/Ulcer Post Debridement is improved. Severity of Tissue Post Debridement is: Fat layer exposed. Post procedure Diagnosis Wound #1: Same as Pre-Procedure Wound #2 Pre-procedure diagnosis of Wound #2 is an Abrasion located on the Left,Proximal,Anterior Lower Leg .Severity of Tissue Pre Debridement is: Fat layer exposed. There was a Selective/Open Wound Skin/Epidermis Debridement with a total area of 1.85 sq cm performed by Worthy Keeler, PA. With the following instrument(s): Forceps, and Scissors to remove Non-Viable tissue/material. Material removed includes Skin: Dermis and Skin: Epidermis and after achieving pain control using Other (benzocaine 20% spray). No specimens were taken. A time out was conducted at 17:05, prior to the start of the procedure. A Minimum amount of bleeding was controlled with Pressure. The procedure was tolerated well with a pain level of 4 throughout and  a pain level of 1 following the procedure. Post Debridement Measurements: 3.7cm length x 2.5cm width x 0.1cm depth; 0.726cm^3 volume. Character of Wound/Ulcer Post Debridement is improved. Severity of Tissue Post Debridement is: Fat layer exposed. Post procedure Diagnosis Wound #2: Same as Pre-Procedure Wound #3 Pre-procedure diagnosis of Wound #3 is an Abrasion located on the Left,Distal,Anterior Lower Leg .Severity of Tissue Pre Debridement is: Fat layer exposed. There was a Selective/Open Wound Skin/Epidermis Debridement with a total area of 3 sq cm performed by Worthy Keeler, PA. With the following instrument(s): Forceps, and Scissors to remove Non-Viable tissue/material. Material removed includes Skin: Epidermis and Fibrin/Exudate and after achieving pain control using Other (benzocaine 20% spray). No specimens were taken. A time out was conducted at 17:05, prior to the start of the procedure. A Minimum amount of bleeding was controlled with Pressure. The procedure was tolerated well with a pain level of 4 throughout and a pain level of 1 following the procedure. Post Debridement Measurements: 7.5cm length x 2.5cm width x 0.1cm depth; 1.473cm^3 volume. Character of Wound/Ulcer Post Debridement is improved. Severity of Tissue Post Debridement is: Fat layer exposed. Post procedure Diagnosis Wound #3: Same as Pre-Procedure Plan Follow-up Appointments: Return Appointment in 1 week. Dressing Change Frequency: Wound #1 Left,Lateral Lower Leg: Change dressing every day. Wound #2 Left,Proximal,Anterior Lower Leg: Change dressing every day. Wound #3 Left,Distal,Anterior Lower Leg: Change dressing every day. Wound Cleansing: Clean wound with Wound Cleanser - all wounds May shower and wash wound with soap and water. Primary Wound Dressing: Wound #1 Left,Lateral Lower Leg: Xeroform Wound #2 Left,Proximal,Anterior Lower Leg: Xeroform Wound #3 Left,Distal,Anterior Lower  Leg: Xeroform Secondary Dressing: Wound #1 Left,Lateral Lower Leg: Kerlix/Rolled Gauze ABD pad Wound #2 Left,Proximal,Anterior Lower Leg: Kerlix/Rolled Gauze ABD pad Wound #3 Left,Distal,Anterior Lower Leg: Kerlix/Rolled Gauze ABD pad Home Health: [facility]. - [facility]. My suggestion based on what I am seeing at this point is good to be that we initiate treatment with Xeroform gauze dressing following locations she is in agreement with the plan. 2. I recommend covering this with ABD pad and rolled gauze to secure in place. 3. I Georgina Peer suggest that the patient change this daily that means [facility] can do it on the days they are there and the patient's daughter will do it on  the other days. That was discussed with the patient and her daughter today. We will see patient back for reevaluation in 1 week here in the clinic. If anything worsens or changes patient will contact our office for additional recommendations. Electronic Signature(s) Signed: 12/20/2019 10:01:45 PM By: Worthy Keeler PA-C Entered By: Worthy Keeler on 12/20/2019 22:01:44 -------------------------------------------------------------------------------- HxROS Details Patient Name: Date of Service: [name], [name] 12/20/2019 2:45 PM Medical Record [id number] Patient Account Number: [id number] Date of Birth/Sex: Treating RN: Sep 06, 1932 (86 y.o. [name] Primary Care Provider: Cristie Hem Other Clinician: Referring Provider: Treating Provider/Extender:Stone III, Wallie Char, LAURA Weeks in Treatment: 0 Information Obtained From Patient Constitutional Symptoms (General Health) Complaints and Symptoms: Negative for: Fatigue; Fever; Chills; Marked Weight Change Eyes Complaints and Symptoms: Positive for: Glasses / Contacts - glasses Negative for: Dry Eyes; Vision Changes Medical History: Negative for: Cataracts; Glaucoma; Optic  Neuritis Ear/Nose/Mouth/Throat Complaints and Symptoms: Negative for: Chronic sinus problems or rhinitis Medical History: Negative for: Chronic sinus problems/congestion; Middle ear problems Respiratory Complaints and Symptoms: Negative for: Chronic or frequent coughs; Shortness of Breath Medical History: Negative for: Aspiration; Asthma; Chronic Obstructive Pulmonary Disease (COPD); Pneumothorax; Sleep Apnea; Tuberculosis Past Medical History Notes: 08/30/2019 fell with fx ribs 10/23/2019 pleural effusion with thoracentesis drain placed and currently draining. Cardiovascular Complaints and Symptoms: Negative for: Chest pain Medical History: Positive for: Coronary Artery Disease; Hypertension; Myocardial Infarction - 2007 Negative for: Angina; Arrhythmia; Congestive Heart Failure; Deep Vein Thrombosis; Hypotension; Peripheral Arterial Disease; Peripheral Venous Disease; Phlebitis; Vasculitis Gastrointestinal Complaints and Symptoms: Negative for: Frequent diarrhea; Nausea; Vomiting Medical History: Negative for: Cirrhosis ; Colitis; Crohns; Hepatitis A; Hepatitis B; Hepatitis C Genitourinary Complaints and Symptoms: Negative for: Frequent urination Medical History: Negative for: End Stage Renal Disease Integumentary (Skin) Complaints and Symptoms: Positive for: Wounds - currently left leg Medical History: Negative for: History of Burn Musculoskeletal Complaints and Symptoms: Positive for: Muscle Weakness Negative for: Muscle Pain Medical History: Positive for: Osteoarthritis Negative for: Gout; Rheumatoid Arthritis; Osteomyelitis Neurologic Complaints and Symptoms: Negative for: Numbness/parasthesias Medical History: Negative for: Dementia; Neuropathy; Quadriplegia; Paraplegia; Seizure Disorder Psychiatric Complaints and Symptoms: Positive for: Claustrophobia Negative for: Suicidal Medical History: Positive for: Confinement Anxiety Negative for:  Anorexia/bulimia Hematologic/Lymphatic Medical History: Negative for: Anemia; Hemophilia; Human Immunodeficiency Virus; Lymphedema; Sickle Cell Disease Endocrine Medical History: Positive for: Type II Diabetes Negative for: Type I Diabetes Time with diabetes: 15 years Treated with: Oral agents, Diet Blood sugar tested every day: No Immunological Medical History: Negative for: Lupus Erythematosus; Raynauds; Scleroderma Oncologic Medical History: Negative for: Received Chemotherapy; Received Radiation Immunizations Pneumococcal Vaccine: Received Pneumococcal Vaccination: Yes Implantable Devices No devices added Hospitalization / Surgery History Type of Hospitalization/Surgery 10/23/2019 chest tube placement [place] hysterectomy Left hip replacement Family and Social History Cancer: Yes - Siblings; Diabetes: No; Heart Disease: Yes - Father,Siblings; Hereditary Spherocytosis: No; Hypertension: Yes - Father; Kidney Disease: No; Lung Disease: No; Seizures: No; Stroke: No; Thyroid Problems: No; Tuberculosis: No; Former smoker - quit 11/15/1969; Marital Status - Widowed; Alcohol Use: Never; Drug Use: No History; Caffeine Use: Never; Financial Concerns: No; Food, Clothing or Shelter Needs: No; Support System Lacking: No; Transportation Concerns: No Electronic Signature(s) Signed: 12/20/2019 5:08:19 PM By: Deon Pilling Signed: 12/20/2019 10:08:08 PM By: Worthy Keeler PA-C Entered By: Deon Pilling on 12/20/2019 15:54:27 -------------------------------------------------------------------------------- SuperBill Details Patient Name: Date of Service: IZAMAR, DELIS 12/20/2019 Medical Record [id number] Patient Account Number: [id number] Date of Birth/Sex: Treating RN: October 04, 1931 (86 y.o. [name] Primary Care Provider: Jacalyn Lefevre,  LAURA Other Clinician: Referring Provider: Treating Provider/Extender:Stone III, Wallie Char, LAURA Weeks in Treatment: 0 Diagnosis  Coding ICD-10 Codes Code Description S81.802A Unspecified open wound, left lower leg, initial encounter L97.822 Non-pressure chronic ulcer of other part of left lower leg with fat layer exposed E11.622 Type 2 diabetes mellitus with other skin ulcer I10 Essential (primary) hypertension Z79.01 Long term (current) use of anticoagulants Facility Procedures CPT4 Code Description: [id number] [place] VISIT-LEV 3 EST PT [id number] - DEBRIDE WOUND 1ST 20 SQ CM OR < ICD-10 Diagnosis Description L97.822 Non-pressure chronic ulcer of other part of left lower leg wi Modifier: 25 th fat laye Quantity: 1 1 r exposed Physician Procedures CPT4 Code Description: [id number] [place] PHYS LEVEL 3 NEW PT ICD-10 Diagnosis Description S81.802A Unspecified open wound, left lower leg, initial encounter L97.822 Non-pressure chronic ulcer of other part of left lower le E11.622 Type 2 diabetes mellitus with  other skin ulcer I10 Essential (primary) hypertension Modifier: 25 g with fat laye Quantity: 1 r exposed CPT4 Code Description: [id number] - WC PHYS DEBR WO ANESTH 20 SQ CM ICD-10 Diagnosis Description L97.822 Non-pressure chronic ulcer of other part of left lower leg w Modifier: ith fat layer e Quantity: 1 xposed Electronic Signature(s) Signed: 12/20/2019 10:02:10 PM By: Worthy Keeler PA-C Previous Signature: 12/20/2019 6:50:57 PM Version By: Baruch Gouty RN, BSN Entered By: Worthy Keeler on 12/20/2019 22:02:10

## 2019-12-20 NOTE — Progress Notes (Signed)
[name] ([id number]) Visit Report for 12/20/2019 Allergy List Details Patient Name: Date of Service: [name], [name] 12/20/2019 2:45 PM Medical Record [id number] Patient Account Number: [id number] Date of Birth/Sex: Treating RN: [birth year] (86 y.o. [name] Primary Care Khloi Rawl: Cristie Hem Other Clinician: Referring Camaryn Lumbert: Treating Arianna Delsanto/Extender:Stone III, Wallie Char, LAURA Weeks in Treatment: 0 Allergies Active Allergies lisinopril Reaction: cough Allergy Notes Electronic Signature(s) Signed: 12/20/2019 5:08:19 PM By: Deon Pilling Entered By: Deon Pilling on 12/20/2019 15:42:20 -------------------------------------------------------------------------------- Arrival Information Details Patient Name: Date of Service: [name], [name] 12/20/2019 2:45 PM Medical Record [id number] Patient Account Number: [id number] Date of Birth/Sex: Treating RN: [birth year] (86 y.o. [name] Primary Care Brynn Mulgrew: Cristie Hem Other Clinician: Referring Choice Kleinsasser: Treating Malynn Lucy/Extender:Stone III, Wallie Char, LAURA Weeks in Treatment: 0 Visit Information Patient Arrived: Cane Arrival Time: 15:37 Accompanied By: self Transfer Assistance: None Patient Identification Verified: Yes Secondary Verification Process Yes Completed: Patient Requires Transmission- No Based Precautions: Patient Has Alerts: Yes Patient Alerts: Patient on Blood Thinner Electronic Signature(s) Signed: 12/20/2019 5:08:19 PM By: Deon Pilling Entered By: Deon Pilling on 12/20/2019 15:40:16 -------------------------------------------------------------------------------- Clinic Level of Care Assessment Details Patient Name: Date of Service: [name], [name] 12/20/2019 2:45 PM Medical Record [id number] Patient Account Number: [id number] Date of Birth/Sex: Treating RN: 12-05-1931 (86 y.o. [name] Primary Care Aydden Cumpian: Cristie Hem Other Clinician: Referring  Jaymie Mckiddy: Treating Eleora Sutherland/Extender:Stone III, Wallie Char, LAURA Weeks in Treatment: 0 Clinic Level of Care Assessment Items TOOL 1 Quantity Score []  - Use when EandM and Procedure is performed on INITIAL visit 0 ASSESSMENTS - Nursing Assessment / Reassessment X - General Physical Exam (combine w/ comprehensive assessment (listed just below) 1 20 when performed on new pt. evals) X - Comprehensive Assessment (HX, ROS, Risk Assessments, Wounds Hx, etc.) 1 25 ASSESSMENTS - Wound and Skin Assessment / Reassessment []  - Dermatologic / Skin Assessment (not related to wound area) 0 ASSESSMENTS - Ostomy and/or Continence Assessment and Care []  - Incontinence Assessment and Management 0 []  - Ostomy Care Assessment and Management (repouching, etc.) 0 PROCESS - Coordination of Care X - Simple Patient / Family Education for ongoing care 1 15 []  - Complex (extensive) Patient / Family Education for ongoing care 0 X - Staff obtains Programmer, systems, Records, Test Results / Process Orders 1 10 X - Staff telephones HHA, Nursing Homes / Clarify orders / etc 1 10 []  - Routine Transfer to another Facility (non-emergent condition) 0 []  - Routine Hospital Admission (non-emergent condition) 0 X - New Admissions / Biomedical engineer / Ordering NPWT, Apligraf, etc. 1 15 []  - Emergency Hospital Admission (emergent condition) 0 PROCESS - Special Needs []  - Pediatric / Minor Patient Management 0 []  - Isolation Patient Management 0 []  - Hearing / Language / Visual special needs 0 []  - Assessment of Community assistance (transportation, D/C planning, etc.) 0 []  - Additional assistance / Altered mentation 0 []  - Support Surface(s) Assessment (bed, cushion, seat, etc.) 0 INTERVENTIONS - Miscellaneous []  - External ear exam 0 []  - Patient Transfer (multiple staff / Civil Service fast streamer / Similar devices) 0 []  - Simple Staple / Suture removal (25 or less) 0 []  - Complex Staple / Suture removal (26 or more) 0 []  -  Hypo/Hyperglycemic Management (do not check if billed separately) 0 X - Ankle / Brachial Index (ABI) - do not check if billed separately 1 15 Has the patient been seen at the hospital within the last three years: Yes Total Score: 110 Level Of Care: New/Established - Level 3 Electronic Signature(s)  Signed: 12/20/2019 6:50:57 PM By: Baruch Gouty RN, BSN Entered By: Baruch Gouty on 12/20/2019 17:08:05 -------------------------------------------------------------------------------- Encounter Discharge Information Details Patient Name: Date of Service: [name], [name] 12/20/2019 2:45 PM Medical Record [id number] Patient Account Number: [id number] Date of Birth/Sex: Treating RN: 12-30-1931 (86 y.o. [name] Primary Care Jamareon Shimel: Cristie Hem Other Clinician: Referring Cataleyah Colborn: Treating Quinnetta Roepke/Extender:Stone III, Wallie Char, LAURA Weeks in Treatment: 0 Encounter Discharge Information Items Post Procedure Vitals Discharge Condition: Stable Temperature (F): 98.4 Ambulatory Status: Cane Pulse (bpm): 54 Discharge Destination: Home Respiratory Rate (breaths/min): 18 Transportation: Private Auto Blood Pressure (mmHg): 155/65 Accompanied By: daughter Schedule Follow-up Appointment: Yes Clinical Summary of Care: Electronic Signature(s) Signed: 12/20/2019 5:34:24 PM By: Deon Pilling Entered By: Deon Pilling on 12/20/2019 17:21:20 -------------------------------------------------------------------------------- Lower Extremity Assessment Details Patient Name: Date of Service: [name], [name] 12/20/2019 2:45 PM Medical Record [id number] Patient Account Number: [id number] Date of Birth/Sex: Treating RN: [birth year] (86 y.o. [name] Primary Care Ahmani Prehn: Cristie Hem Other Clinician: Referring Juancarlos Crescenzo: Treating Cutberto Winfree/Extender:Stone III, Wallie Char, LAURA Weeks in Treatment: 0 Edema Assessment Assessed: [Left: Yes] [Right: No] Edema: [Left: Ye] [Right:  s] Calf Left: Right: Point of Measurement: 33 cm From Medial Instep 33 cm cm Ankle Left: Right: Point of Measurement: 11 cm From Medial Instep 20.7 cm cm Vascular Assessment Blood Pressure: Brachial: [Left:144] Ankle: [Left:Dorsalis Pedis: 138 0.96] Electronic Signature(s) Signed: 12/20/2019 5:08:19 PM By: Deon Pilling Entered By: Deon Pilling on 12/20/2019 16:16:08 -------------------------------------------------------------------------------- Multi-Disciplinary Care Plan Details Patient Name: Date of Service: [name], [name] 12/20/2019 2:45 PM Medical Record [id number] Patient Account Number: [id number] Date of Birth/Sex: Treating RN: Feb 25, 1932 (86 y.o. [name] Primary Care Jad Johansson: Cristie Hem Other Clinician: Referring Corrinna Karapetyan: Treating Bakari Nikolai/Extender:Stone III, Wallie Char, LAURA Weeks in Treatment: 0 Active Inactive Abuse / Safety / Falls / Self Care Management Nursing Diagnoses: Potential for falls Goals: Patient will not experience any injury related to falls Date Initiated: 12/20/2019 Target Resolution Date: 01/17/2020 Goal Status: Active Patient/caregiver will verbalize/demonstrate measures taken to prevent injury and/or falls Date Initiated: 12/20/2019 Target Resolution Date: 01/17/2020 Goal Status: Active Interventions: Assess fall risk on admission and as needed Assess impairment of mobility on admission and as needed per policy Notes: Wound/Skin Impairment Nursing Diagnoses: Impaired tissue integrity Knowledge deficit related to ulceration/compromised skin integrity Goals: Patient/caregiver will verbalize understanding of skin care regimen Date Initiated: 12/20/2019 Target Resolution Date: 01/17/2020 Goal Status: Active Ulcer/skin breakdown will have a volume reduction of 30% by week 4 Date Initiated: 12/20/2019 Target Resolution Date: 01/17/2020 Goal Status: Active Interventions: Assess patient/caregiver ability to obtain  necessary supplies Assess patient/caregiver ability to perform ulcer/skin care regimen upon admission and as needed Assess ulceration(s) every visit Provide education on ulcer and skin care Treatment Activities: Skin care regimen initiated : 12/20/2019 Topical wound management initiated : 12/20/2019 Notes: Electronic Signature(s) Signed: 12/20/2019 6:50:57 PM By: Baruch Gouty RN, BSN Entered By: Baruch Gouty on 12/20/2019 17:04:35 -------------------------------------------------------------------------------- Pain Assessment Details Patient Name: Date of Service: [name], [name] 12/20/2019 2:45 PM Medical Record [id number] Patient Account Number: [id number] Date of Birth/Sex: Treating RN: 1931/11/13 (86 y.o. [name] Primary Care Javeah Loeza: Cristie Hem Other Clinician: Referring Dawnielle Christiana: Treating Steve Gregg/Extender:Stone III, Wallie Char, LAURA Weeks in Treatment: 0 Active Problems Location of Pain Severity and Description of Pain Patient Has Paino No Site Locations Rate the pain. Current Pain Level: 0 Pain Management and Medication Current Pain Management: Medication: No Cold Application: No Rest: No Massage: No Activity: No T.E.N.S.: No Heat Application: No Leg drop or elevation: No  Is the Current Pain Management Adequate: Adequate How does your wound impact your activities of daily livingo Sleep: No Bathing: No Appetite: No Relationship With Others: No Bladder Continence: No Emotions: No Bowel Continence: No Work: No Toileting: No Drive: No Dressing: No Hobbies: No Electronic Signature(s) Signed: 12/20/2019 5:08:19 PM By: Deon Pilling Entered By: Deon Pilling on 12/20/2019 15:44:59 -------------------------------------------------------------------------------- Patient/Caregiver Education Details Patient Name: Date of Service: [name] 3/31/2021andnbsp2:45 PM Medical Record [id number] Patient Account Number:  [id number] Date of Birth/Gender: Treating RN: April 15, 1932 (86 y.o. [name] Primary Care Physician: Cristie Hem Other Clinician: Referring Physician: Treating Physician/Extender:Stone III, Wallie Char, LAURA Weeks in Treatment: 0 Education Assessment Education Provided To: Patient Education Topics Provided Welcome To [facility] [place]: Handouts: Welcome To [facility] [place] Methods: Explain/Verbal, Printed Responses: Reinforcements needed, State content correctly Wound/Skin Impairment: Handouts: Caring for Your Ulcer, Skin Care Do's and Dont's Methods: Explain/Verbal, Printed Responses: Reinforcements needed, State content correctly Electronic Signature(s) Signed: 12/20/2019 6:50:57 PM By: Baruch Gouty RN, BSN Entered By: Baruch Gouty on 12/20/2019 17:05:19 -------------------------------------------------------------------------------- Wound Assessment Details Patient Name: Date of Service: [name], [name] 12/20/2019 2:45 PM Medical Record [id number] Patient Account Number: [id number] Date of Birth/Sex: Treating RN: [birth year] (86 y.o. Helene Shoe, Meta.Reding Primary Care Clarion Mooneyhan: Cristie Hem Other Clinician: Referring Heavenleigh Petruzzi: Treating Shontia Gillooly/Extender:Stone III, Wallie Char, LAURA Weeks in Treatment: 0 Wound Status Wound Number: 1 Primary Skin Tear Etiology: Wound Location: Left Lower Leg - Lateral Secondary Diabetic Wound/Ulcer of the Lower Wounding Event: Trauma Etiology: Extremity Date Acquired: 10/23/2019 Wound Open Weeks Of Treatment: 0 Status: Clustered Wound: No Comorbid Coronary Artery Disease, Hypertension, History: Myocardial Infarction, Type II Diabetes, Osteoarthritis, Confinement Anxiety Wound Measurements Length: (cm) 2.1 % Reductio Width: (cm) 0.4 % Reduc Depth: (cm) 0.1 Epithel Area: (cm) 0.66 Tunnel Volume: (cm) 0.066 Underm Wound Description Full Thickness Without Exposed Support Foul O Classification:  Structures Slough Wound Distinct, outline attached Margin: Exudate Medium Amount: Exudate Serosanguineous Type: Exudate red, brown Color: Wound Bed Granulation Amount: Small (1-33%) Granulation Quality: Red, Pink Fascia Necrotic Amount: Large (67-100%) Fat Lay Necrotic Quality: Adherent Slough Tendon Muscle Joint E Bone Ex dor After Cleansing: No /Fibrino Yes Exposed Structure Exposed: No er (Subcutaneous Tissue) Exposed: Yes Exposed: No Exposed: No xposed: No posed: No n in Area: 0% tion in Volume: 0% ialization: Small (1-33%) ing: No ining: No Treatment Notes Wound #1 (Left, Lateral Lower Leg) 1. Cleanse With Wound Cleanser 3. Primary Dressing Applied Xeroform Gauze 4. Secondary Dressing ABD Pad Dry Gauze Roll Gauze 5. Secured With Medipore tape Notes netting. explained the orders, dressings, frequency of change, and when to return to wound center. patient and daughter in agreement. Electronic Signature(s) Signed: 12/20/2019 5:08:19 PM By: Deon Pilling Entered By: Deon Pilling on 12/20/2019 16:09:09 -------------------------------------------------------------------------------- Wound Assessment Details Patient Name: Date of Service: [name], [name] 12/20/2019 2:45 PM Medical Record [id number] Patient Account Number: [id number] Date of Birth/Sex: Treating RN: 1932-06-25 (86 y.o. Helene Shoe, Meta.Reding Primary Care Kyel Purk: Cristie Hem Other Clinician: Referring Yohann Curl: Treating Jeyren Danowski/Extender:Stone III, Wallie Char, LAURA Weeks in Treatment: 0 Wound Status Wound Number: 2 Primary Abrasion Etiology: Wound Location: Left Lower Leg - Anterior, Proximal Secondary Diabetic Wound/Ulcer of the Lower Wounding Event: Trauma Etiology: Extremity Date Acquired: 10/23/2019 Wound Open Weeks Of Treatment: 0 Status: Clustered Wound: No Comorbid Coronary Artery Disease, Hypertension, History: Myocardial Infarction, Type II Diabetes, Osteoarthritis,  Confinement Anxiety Wound Measurements Length: (cm) 3.7 % Reduc Width: (cm) 2.5 % Reduc Depth: (cm) 0.1 Epithel Area: (cm) 7.265 Tunnel  Volume: (cm) 0.726 Underm Wound Description Classification: Full Thickness Without Exposed Support Foul O Structures Slough Wound Distinct, outline attached Margin: Exudate Medium Amount: Exudate Serosanguineous Type: Exudate red, brown Color: Wound Bed Granulation Amount: Small (1-33%) Granulation Quality: Red, Pink Fascia Necrotic Amount: Large (67-100%) Fat La Necrotic Quality: Adherent Slough Tendon Muscle Joint Bone E dor After Cleansing: No /Fibrino Yes Exposed Structure Exposed: No yer (Subcutaneous Tissue) Exposed: Yes Exposed: No Exposed: No Exposed: No xposed: No tion in Area: 0% tion in Volume: 0% ialization: Small (1-33%) ing: No ining: No Treatment Notes Wound #2 (Left, Proximal, Anterior Lower Leg) 1. Cleanse With Wound Cleanser 3. Primary Dressing Applied Xeroform Gauze 4. Secondary Dressing ABD Pad Dry Gauze Roll Gauze 5. Secured With Medipore tape Notes netting. explained the orders, dressings, frequency of change, and when to return to wound center. patient and daughter in agreement. Electronic Signature(s) Signed: 12/20/2019 5:08:19 PM By: Deon Pilling Entered By: Deon Pilling on 12/20/2019 16:09:23 -------------------------------------------------------------------------------- Wound Assessment Details Patient Name: Date of Service: [name], [name] 12/20/2019 2:45 PM Medical Record [id number] Patient Account Number: [id number] Date of Birth/Sex: Treating RN: 20-Aug-1932 (86 y.o. Helene Shoe, Meta.Reding Primary Care Seiya Silsby: Cristie Hem Other Clinician: Referring Sheridan Hew: Treating Akina Maish/Extender:Stone III, Wallie Char, LAURA Weeks in Treatment: 0 Wound Status Wound Number: 3 Primary Abrasion Etiology: Wound Location: Left Lower Leg - Anterior, Distal Secondary Diabetic Wound/Ulcer of  the Lower Wounding Event: Trauma Etiology: Extremity Date Acquired: 10/23/2019 Wound Open Weeks Of Treatment: 0 Status: Clustered Wound: No Comorbid Coronary Artery Disease, Hypertension, History: Myocardial Infarction, Type II Diabetes, Osteoarthritis, Confinement Anxiety Wound Measurements Length: (cm) 7.5 % Reduct Width: (cm) 2.5 % Reduct Depth: (cm) 0.1 Epitheli Area: (cm) 14.726 Tunneli Volume: (cm) 1.473 Undermi Wound Description Classification: Full Thickness Without Exposed Support Structures Wound Distinct, outline attached Margin: Exudate Medium Amount: Exudate Serosanguineous Type: Exudate red, brown Color: Wound Bed Granulation Amount: Small (1-33%) Granulation Quality: Red, Pink Necrotic Amount: Large (67-100%) Necrotic Quality: Adherent Slough Treatment Notes Wound #3 (Left, Distal, Anterior Lower Leg) 1. Cleanse With Wound Cleanser 3. Primary Dressing Applied Xeroform Gauze 4. Secondary Dressing ABD Pad Dry Gauze Roll Gauze 5. Secured With Medipore tape Foul Odor After Cleansing: No Slough/Fibrino Yes Exposed Structure Fascia Exposed: No Fat Layer (Subcutaneous Tissue) Exposed: Yes Tendon Exposed: No Muscle Exposed: No Joint Exposed: No Bone Exposed: No ion in Area: ion in Volume: alization: Small (1-33%) ng: No ning: No Notes netting. explained the orders, dressings, frequency of change, and when to return to wound center. patient and daughter in agreement. Electronic Signature(s) Signed: 12/20/2019 5:08:19 PM By: Deon Pilling Entered By: Deon Pilling on 12/20/2019 16:08:56 -------------------------------------------------------------------------------- Vitals Details Patient Name: Date of Service: [name], [name] 12/20/2019 2:45 PM Medical Record [id number] Patient Account Number: [id number] Date of Birth/Sex: Treating RN: [birth year] (86 y.o. Helene Shoe, Meta.Reding Primary Care Regena Delucchi: Cristie Hem Other  Clinician: Referring Cleo Santucci: Treating Areyanna Figeroa/Extender:Stone III, Wallie Char, LAURA Weeks in Treatment: 0 Vital Signs Time Taken: 15:40 Temperature (F): 98.4 Height (in): 67 Pulse (bpm): 54 Source: Stated Respiratory Rate (breaths/min): 18 Weight (lbs): 114 Blood Pressure (mmHg): 155/65 Source: Stated Reference Range: 80 - 120 mg / dl Body Mass Index (BMI): 17.9 Electronic Signature(s) Signed: 12/20/2019 5:08:19 PM By: Deon Pilling Entered By: Deon Pilling on 12/20/2019 15:45:25

## 2019-12-27 ENCOUNTER — Other Ambulatory Visit: Payer: Self-pay

## 2019-12-27 ENCOUNTER — Encounter (HOSPITAL_BASED_OUTPATIENT_CLINIC_OR_DEPARTMENT_OTHER): Payer: Medicare Other | Attending: Physician Assistant | Admitting: Physician Assistant

## 2019-12-27 DIAGNOSIS — L97822 Non-pressure chronic ulcer of other part of left lower leg with fat layer exposed: Secondary | ICD-10-CM | POA: Insufficient documentation

## 2019-12-27 DIAGNOSIS — X58XXXA Exposure to other specified factors, initial encounter: Secondary | ICD-10-CM | POA: Diagnosis not present

## 2019-12-27 DIAGNOSIS — E11622 Type 2 diabetes mellitus with other skin ulcer: Secondary | ICD-10-CM | POA: Diagnosis not present

## 2019-12-27 DIAGNOSIS — S81802A Unspecified open wound, left lower leg, initial encounter: Secondary | ICD-10-CM | POA: Diagnosis not present

## 2019-12-27 DIAGNOSIS — Z7901 Long term (current) use of anticoagulants: Secondary | ICD-10-CM | POA: Diagnosis not present

## 2019-12-27 DIAGNOSIS — I10 Essential (primary) hypertension: Secondary | ICD-10-CM | POA: Insufficient documentation

## 2019-12-27 NOTE — Progress Notes (Addendum)
Karina, Pitts ([id number]) Visit Report for 12/27/2019 Chief Complaint Document Details Patient Name: Date of Service: [name], [name] 12/27/2019 11:15 AM Medical Record [id number] Patient Account Number: [id number] Date of Birth/Sex: Treating RN: 1932/01/13 (86 y.o. [name] Primary Care Provider: Cristie Hem Other Clinician: Referring Provider: Treating Provider/Extender:Stone III, Wallie Char, LAURA Weeks in Treatment: 1 Information Obtained from: Patient Chief Complaint Left LE skin tears Electronic Signature(s) Signed: 12/27/2019 11:42:20 AM By: Worthy Keeler PA-C Entered By: Worthy Keeler on 12/27/2019 11:42:20 -------------------------------------------------------------------------------- Debridement Details Patient Name: Date of Service: Karina, Pitts 12/27/2019 11:15 AM Medical Record [id number] Patient Account Number: [id number] Date of Birth/Sex: Treating RN: September 20, 1932 (86 y.o. [name] Primary Care Provider: Cristie Hem Other Clinician: Referring Provider: Treating Provider/Extender:Stone III, Wallie Char, LAURA Weeks in Treatment: 1 Debridement Performed for Wound #1 Left,Lateral Lower Leg Assessment: Performed By: Clinician Baruch Gouty, RN Debridement Type: Chemical/Enzymatic/Mechanical Agent Used: Santyl Severity of Tissue Pre Fat layer exposed Debridement: Level of Consciousness (Pre- Awake and Alert procedure): Pre-procedure Verification/Time Out Taken: Yes - 12:24 Bleeding: None Response to Treatment: Procedure was tolerated well Level of Consciousness Awake and Alert (Post-procedure): Post Debridement Measurements of Total Wound Length: (cm) 3.4 Width: (cm) 2.9 Depth: (cm) 0.1 Volume: (cm) 0.774 Character of Wound/Ulcer Post Requires Further Debridement Debridement: Severity of Tissue Post Debridement: Fat layer exposed Post Procedure Diagnosis Same as Pre-procedure Electronic Signature(s) Signed:  12/27/2019 6:07:56 PM By: Baruch Gouty RN, BSN Signed: 12/27/2019 7:02:55 PM By: Worthy Keeler PA-C Entered By: Baruch Gouty on 12/27/2019 12:24:50 -------------------------------------------------------------------------------- Debridement Details Patient Name: Date of Service: Karina, Pitts 12/27/2019 11:15 AM Medical Record [id number] Patient Account Number: [id number] Date of Birth/Sex: Treating RN: 1931-10-21 (86 y.o. [name] Primary Care Provider: Cristie Hem Other Clinician: Referring Provider: Treating Provider/Extender:Stone III, Wallie Char, LAURA Weeks in Treatment: 1 Debridement Performed for Wound #3 Left,Distal,Anterior Lower Leg Assessment: Performed By: Clinician Baruch Gouty, RN Debridement Type: Chemical/Enzymatic/Mechanical Agent Used: Santyl Severity of Tissue Pre Fat layer exposed Debridement: Level of Consciousness (Pre- Awake and Alert procedure): Pre-procedure Verification/Time Out Taken: Yes - 12:24 Bleeding: None Response to Treatment: Procedure was tolerated well Level of Consciousness Awake and Alert (Post-procedure): Post Debridement Measurements of Total Wound Length: (cm) 7.5 Width: (cm) 2.3 Depth: (cm) 0.1 Volume: (cm) 1.355 Character of Wound/Ulcer Post Requires Further Debridement Debridement: Severity of Tissue Post Debridement: Fat layer exposed Post Procedure Diagnosis Same as Pre-procedure Electronic Signature(s) Signed: 12/27/2019 6:07:56 PM By: Baruch Gouty RN, BSN Signed: 12/27/2019 7:02:55 PM By: Worthy Keeler PA-C Entered By: Baruch Gouty on 12/27/2019 12:25:15 -------------------------------------------------------------------------------- HPI Details Patient Name: Date of Service: Karina, Pitts 12/27/2019 11:15 AM Medical Record [id number] Patient Account Number: [id number] Date of Birth/Sex: Treating RN: 1932/05/09 (86 y.o. [name] Primary Care Provider: Cristie Hem Other  Clinician: Referring Provider: Treating Provider/Extender:Stone III, Wallie Char, LAURA Weeks in Treatment: 1 History of Present Illness HPI Description: 12/20/2019 patient presents today for initial evaluation here in our clinic concerning issues that she unfortunately has been having with skin tears over the right lower extremity. This happened secondary to a fall that she had initially in December causing some skin tears that for the most part healed and then she sustained a second fall in February 2021. Subsequently the patient did have skin tears which they have been trying to manage at this point and home health has not really been put anything on it as they are waiting for orders from [place]. As far as the daughter is  concerned with the patient she has been using Vaseline over the area at this point to try to keep the area moist and nothing would stick to it. The patient is having discomfort unfortunately. Nonetheless I do not see any signs of active infection which is great news. She does have a history of diabetes mellitus type 2, hypertension, and is on anticoagulant therapy due to history of stroke. 12/27/2019 upon evaluation today patient appears to be doing well with regard to her wounds. They do appear to be somewhat dry however. She complains of the Xeroform having been sticking to the wound bed. Again not exactly sure why this would be the case at this point. Nonetheless there is no signs of infection I believe she may actually benefit from [place] currently. Electronic Signature(s) Signed: 12/27/2019 1:46:14 PM By: Worthy Keeler PA-C Entered By: Worthy Keeler on 12/27/2019 13:46:13 -------------------------------------------------------------------------------- Physical Exam Details Patient Name: Date of Service: Karina, Pitts 12/27/2019 11:15 AM Medical Record [id number] Patient Account Number: [id number] Date of Birth/Sex: Treating RN: 1932/07/15 (86 y.o. [name] Primary Care Provider: Cristie Hem Other Clinician: Referring Provider: Treating Provider/Extender:Stone III, Wallie Char, LAURA Weeks in Treatment: 1 Constitutional Well-nourished and well-hydrated in no acute distress. Respiratory normal breathing without difficulty. Psychiatric this patient is able to make decisions and demonstrates good insight into disease process. Alert and Oriented x 3. pleasant and cooperative. Notes 12/27/2019 upon evaluation patient appears for evaluation today actually initially did send in a prescription for the patient to have Santyl. Unfortunately the pharmacy was telling her that this would be $1200 after her insurance paid her part. That seems rather expensive to me to be perfectly honest but nonetheless that is what they were told. For that reason we actually recommended Medihoney for the time being. Electronic Signature(s) Signed: 12/27/2019 6:49:07 PM By: Worthy Keeler PA-C Entered By: Worthy Keeler on 12/27/2019 18:49:07 -------------------------------------------------------------------------------- Physician Orders Details Patient Name: Date of Service: ARRAH, RHEAUME 12/27/2019 11:15 AM Medical Record [id number] Patient Account Number: [id number] Date of Birth/Sex: Treating RN: 04-Jan-1932 (86 y.o. [name] Primary Care Provider: Cristie Hem Other Clinician: Referring Provider: Treating Provider/Extender:Stone III, Wallie Char, LAURA Weeks in Treatment: 1 Verbal / Phone Orders: No Diagnosis Coding ICD-10 Coding Code Description S81.802A Unspecified open wound, left lower leg, initial encounter L97.822 Non-pressure chronic ulcer of other part of left lower leg with fat layer exposed E11.622 Type 2 diabetes mellitus with other skin ulcer I10 Essential (primary) hypertension Z79.01 Long term (current) use of anticoagulants Follow-up Appointments Return Appointment in 1 week. Dressing Change Frequency Wound #1  Left,Lateral Lower Leg Change dressing every day. Wound #3 Left,Distal,Anterior Lower Leg Change dressing every day. Wound Cleansing Clean wound with Wound Cleanser - all wounds May shower and wash wound with soap and water. Primary Wound Dressing Wound #1 Left,Lateral Lower Leg Santyl Ointment - may use medihoney if santyl unaffordable Xeroform - over santyl Wound #3 Left,Distal,Anterior Lower Leg Xeroform - over santyl Secondary Dressing Wound #1 Left,Lateral Lower Leg Kerlix/Rolled Gauze ABD pad Wound #3 Left,Distal,Anterior Lower Leg Kerlix/Rolled Gauze ABD pad [facility] for wound care. - [facility] Patient Medications Allergies: lisinopril Notifications Medication Indication Start End Santyl 12/27/2019 DOSE topical 250 unit/gram ointment - ointment topical Apply nickel thick daily to the wound bed and then cover with a dressing as directed in clinic Keflex 12/27/2019 DOSE 1 - oral 500 mg capsule - 1 capsule oral taken 3 times per day for 10  days Electronic Signature(s) Signed: 12/27/2019 6:07:56 PM By: Baruch Gouty RN, BSN Signed: 12/27/2019 7:02:55 PM By: Worthy Keeler PA-C Previous Signature: 12/27/2019 12:34:24 PM Version By: Worthy Keeler PA-C Entered By: Baruch Gouty on 12/27/2019 16:21:07 -------------------------------------------------------------------------------- Problem List Details Patient Name: Date of Service: PARALEE, SEMMLER 12/27/2019 11:15 AM Medical Record [id number] Patient Account Number: [id number] Date of Birth/Sex: Treating RN: 1932/09/10 (86 y.o. [name] Primary Care Provider: Cristie Hem Other Clinician: Referring Provider: Treating Provider/Extender:Stone III, Wallie Char, LAURA Weeks in Treatment: 1 Active Problems ICD-10 Evaluated Encounter Code Description Active Date Today Diagnosis S81.802A Unspecified open wound, left lower leg, initial 12/20/2019 No Yes encounter L97.822  Non-pressure chronic ulcer of other part of left lower 12/20/2019 No Yes leg with fat layer exposed E11.622 Type 2 diabetes mellitus with other skin ulcer 12/20/2019 No Yes I10 Essential (primary) hypertension 12/20/2019 No Yes Z79.01 Long term (current) use of anticoagulants 12/20/2019 No Yes Inactive Problems Resolved Problems Electronic Signature(s) Signed: 12/27/2019 11:42:13 AM By: Worthy Keeler PA-C Entered By: Worthy Keeler on 12/27/2019 11:42:12 -------------------------------------------------------------------------------- Progress Note Details Patient Name: Date of Service: JAYLIE, DISHAROON 12/27/2019 11:15 AM Medical Record [id number] Patient Account Number: [id number] Date of Birth/Sex: Treating RN: 01/24/1932 (86 y.o. [name] Primary Care Provider: Cristie Hem Other Clinician: Referring Provider: Treating Provider/Extender:Stone III, Wallie Char, LAURA Weeks in Treatment: 1 Subjective Chief Complaint Information obtained from Patient Left LE skin tears History of Present Illness (HPI) 12/20/2019 patient presents today for initial evaluation here in our clinic concerning issues that she unfortunately has been having with skin tears over the right lower extremity. This happened secondary to a fall that she had initially in December causing some skin tears that for the most part healed and then she sustained a second fall in February 2021. Subsequently the patient did have skin tears which they have been trying to manage at this point and home health has not really been put anything on it as they are waiting for orders from [place]. As far as the daughter is concerned with the patient she has been using Vaseline over the area at this point to try to keep the area moist and nothing would stick to it. The patient is having discomfort unfortunately. Nonetheless I do not see any signs of active infection which is great news. She does have a history of diabetes mellitus  type 2, hypertension, and is on anticoagulant therapy due to history of stroke. 12/27/2019 upon evaluation today patient appears to be doing well with regard to her wounds. They do appear to be somewhat dry however. She complains of the Xeroform having been sticking to the wound bed. Again not exactly sure why this would be the case at this point. Nonetheless there is no signs of infection I believe she may actually benefit from [place] currently. Objective Constitutional Well-nourished and well-hydrated in no acute distress. Vitals Time Taken: 11:52 AM, Height: 67 in, Weight: 114 lbs, BMI: 17.9, Temperature: 98.4 F, Pulse: 60 bpm, Respiratory Rate: 18 breaths/min, Blood Pressure: 114/51 mmHg. Respiratory normal breathing without difficulty. Psychiatric this patient is able to make decisions and demonstrates good insight into disease process. Alert and Oriented x 3. pleasant and cooperative. General Notes: 12/27/2019 upon evaluation patient appears for evaluation today actually initially did send in a prescription for the patient to have Santyl. Unfortunately the pharmacy was telling her that this would be $1200 after her insurance paid her part. That seems rather expensive to me to be perfectly honest  but nonetheless that is what they were told. For that reason we actually recommended Medihoney for the time being. Integumentary (Hair, Skin) Wound #1 status is Open. Original cause of wound was Trauma. The wound is located on the Left,Lateral Lower Leg. The wound measures 3.4cm length x 2.9cm width x 0.1cm depth; 7.744cm^2 area and 0.774cm^3 volume. There is Fat Layer (Subcutaneous Tissue) Exposed exposed. There is no tunneling or undermining noted. There is a medium amount of serosanguineous drainage noted. The wound margin is distinct with the outline attached to the wound base. There is small (1-33%) red, pink granulation within the wound bed. There is a large (67-100%) amount of necrotic  tissue within the wound bed including Adherent Slough. General Notes: redness noted to periwound. Wound #2 status is Open. Original cause of wound was Trauma. The wound is located on the Left,Proximal,Anterior Lower Leg. The wound measures 0cm length x 0cm width x 0cm depth; 0cm^2 area and 0cm^3 volume. Wound #3 status is Open. Original cause of wound was Trauma. The wound is located on the [facility] Lower Leg. The wound measures 7.5cm length x 2.3cm width x 0.1cm depth; 13.548cm^2 area and 1.355cm^3 volume. There is Fat Layer (Subcutaneous Tissue) Exposed exposed. There is no tunneling or undermining noted. There is a medium amount of serosanguineous drainage noted. The wound margin is distinct with the outline attached to the wound base. There is medium (34-66%) red, pink granulation within the wound bed. There is a medium (34-66%) amount of necrotic tissue within the wound bed including Adherent Slough. General Notes: redness noted to periwound. Assessment Active Problems ICD-10 Unspecified open wound, left lower leg, initial encounter Non-pressure chronic ulcer of other part of left lower leg with fat layer exposed Type 2 diabetes mellitus with other skin ulcer Essential (primary) hypertension Long term (current) use of anticoagulants Procedures Wound #1 Pre-procedure diagnosis of Wound #1 is a Skin Tear located on the Left,Lateral Lower Leg .Severity of Tissue Pre Debridement is: Fat layer exposed. There was a Chemical/Enzymatic/Mechanical debridement performed by Baruch Gouty, RN.Marland Kitchen Agent used was [employer]. A time out was conducted at 12:24, prior to the start of the procedure. There was no bleeding. The procedure was tolerated well. Post Debridement Measurements: 3.4cm length x 2.9cm width x 0.1cm depth; 0.774cm^3 volume. Character of Wound/Ulcer Post Debridement requires further debridement. Severity of Tissue Post Debridement is: Fat layer exposed. Post procedure  Diagnosis Wound #1: Same as Pre-Procedure Wound #3 Pre-procedure diagnosis of Wound #3 is an Abrasion located on the Left,Distal,Anterior Lower Leg .Severity of Tissue Pre Debridement is: Fat layer exposed. There was a Chemical/Enzymatic/Mechanical debridement performed by Baruch Gouty, RN.Marland Kitchen Agent used was [employer]. A time out was conducted at 12:24, prior to the start of the procedure. There was no bleeding. The procedure was tolerated well. Post Debridement Measurements: 7.5cm length x 2.3cm width x 0.1cm depth; 1.355cm^3 volume. Character of Wound/Ulcer Post Debridement requires further debridement. Severity of Tissue Post Debridement is: Fat layer exposed. Post procedure Diagnosis Wound #3: Same as Pre-Procedure Plan Follow-up Appointments: Return Appointment in 1 week. Dressing Change Frequency: Wound #1 Left,Lateral Lower Leg: Change dressing every day. Wound #3 Left,Distal,Anterior Lower Leg: Change dressing every day. Wound Cleansing: Clean wound with Wound Cleanser - all wounds May shower and wash wound with soap and water. Primary Wound Dressing: Wound #1 Left,Lateral Lower Leg: Santyl Ointment - may use medihoney if santyl unaffordable Xeroform - over santyl Wound #3 Left,Distal,Anterior Lower Leg: Xeroform - over santyl Secondary Dressing: Wound #1 Left,Lateral  Lower Leg: Kerlix/Rolled Gauze ABD pad Wound #3 Left,Distal,Anterior Lower Leg: Kerlix/Rolled Gauze ABD pad Home Health: [facility] for wound care. - [facility] The following medication(s) was prescribed: Santyl topical 250 unit/gram ointment ointment topical Apply nickel thick daily to the wound bed and then cover with a dressing as directed in clinic starting 12/27/2019 Keflex oral 500 mg capsule 1 1 capsule oral taken 3 times per day for 10 days starting 12/27/2019 1. My suggestion at this time is good to do that since she cannot get the [facility] we go ahead and initiate treatment with  the Medihoney and then subsequently continue with the Xeroform gauze over top of the Medihoney. Subsequently if she is able to get the [facility] for cheaper that would obviously be superior. 2. I would recommend as well that the patient continue with an ABD pad and roll gauze to secure in place. 3. Also recommend that we go ahead and start her on the Keflex which I did send into the pharmacy for her as well. We will see patient back for reevaluation in 1 week here in the clinic. If anything worsens or changes patient will contact our office for additional recommendations. Electronic Signature(s) Signed: 12/27/2019 6:50:23 PM By: Worthy Keeler PA-C Entered By: Worthy Keeler on 12/27/2019 18:50:23 -------------------------------------------------------------------------------- SuperBill Details Patient Name: Date of Service: JASLINE, CASTELLINI 12/27/2019 Medical Record [id number] Patient Account Number: [id number] Date of Birth/Sex: Treating RN: 22-Aug-1932 (86 y.o. [name] Primary Care Provider: Cristie Hem Other Clinician: Referring Provider: Treating Provider/Extender:Stone III, Wallie Char, LAURA Weeks in Treatment: 1 Diagnosis Coding ICD-10 Codes Code Description [phone] Unspecified open wound, left lower leg, initial encounter L97.822 Non-pressure chronic ulcer of other part of left lower leg with fat layer exposed E11.622 Type 2 diabetes mellitus with other skin ulcer I10 Essential (primary) hypertension Z79.01 Long term (current) use of anticoagulants Facility Procedures CPT4 Code: [id number] Description: [phone] - DEBRIDE W/O ANES NON SELECT Modifier: Quantity: 1 Physician Procedures CPT4 Code Description: [id number] - WC PHYS LEVEL 4 - EST PT ICD-10 Diagnosis Description S81.802A Unspecified open wound, left lower leg, initial encount L97.822 Non-pressure chronic ulcer of other part of left lower E11.622 Type 2 diabetes mellitus  with other skin ulcer I10  Essential (primary) hypertension Modifier: er leg with fat laye Quantity: 1 r exposed Electronic Signature(s) Signed: 12/27/2019 6:50:45 PM By: Worthy Keeler PA-C Previous Signature: 12/27/2019 6:07:56 PM Version By: Baruch Gouty RN, BSN Entered By: Worthy Keeler on 12/27/2019 18:50:45

## 2020-01-03 ENCOUNTER — Other Ambulatory Visit: Payer: Self-pay

## 2020-01-03 ENCOUNTER — Encounter (HOSPITAL_BASED_OUTPATIENT_CLINIC_OR_DEPARTMENT_OTHER): Payer: Medicare Other | Admitting: Physician Assistant

## 2020-01-03 DIAGNOSIS — E11622 Type 2 diabetes mellitus with other skin ulcer: Secondary | ICD-10-CM | POA: Diagnosis not present

## 2020-01-03 NOTE — Progress Notes (Signed)
HARVEST, BLEVENS ([id number]) Visit Report for 12/27/2019 Arrival Information Details Patient Name: Date of Service: BRENESHA, GOVEIA 12/27/2019 11:15 AM Medical Record [id number] Patient Account Number: [id number] Date of Birth/Sex: Treating RN: 1932/02/19 (86 y.o. [name] Primary Care Khyla Mccumbers: Cristie Hem Other Clinician: Referring Alarik Radu: Treating Percy Winterrowd/Extender:Stone III, Wallie Char, LAURA Weeks in Treatment: 1 Visit Information History Since Last Visit Added or deleted any medications: No Patient Arrived: Cane Any new allergies or adverse reactions: No Arrival Time: 11:50 Had a fall or experienced change in No Accompanied By: daughter activities of daily living that may affect Transfer Assistance: None risk of falls: Patient Identification Verified: Yes Signs or symptoms of abuse/neglect since last No Secondary Verification Process Yes visito Completed: Hospitalized since last visit: No Patient Requires Transmission- No Implantable device outside of the clinic excluding No Based Precautions: cellular tissue based products placed in the center Patient Has Alerts: Yes since last visit: Patient Alerts: Patient on Blood Has Dressing in Place as Prescribed: Yes Thinner Pain Present Now: Yes Electronic Signature(s) Signed: 01/03/2020 9:20:10 AM By: Sandre Kitty Entered By: Sandre Kitty on 12/27/2019 11:51:18 -------------------------------------------------------------------------------- Encounter Discharge Information Details Patient Name: Date of Service: CADE, WALDROOP 12/27/2019 11:15 AM Medical Record [id number] Patient Account Number: [id number] Date of Birth/Sex: Treating RN: 05-03-1932 (86 y.o. Orvan Falconer Primary Care Athalia Setterlund: Cristie Hem Other Clinician: Referring Kishana Battey: Treating Bilbo Carcamo/Extender:Stone III, Wallie Char, LAURA Weeks in Treatment: 1 Encounter Discharge Information Items Post Procedure Vitals Discharge  Condition: Stable Temperature (F): 98.4 Ambulatory Status: Walker Pulse (bpm): 60 Discharge Destination: Home Respiratory Rate (breaths/min): 18 Transportation: Private Auto Blood Pressure (mmHg): 114/51 Accompanied By: daughter Schedule Follow-up Appointment: Yes Clinical Summary of Care: Electronic Signature(s) Signed: 12/27/2019 5:52:27 PM By: Carlene Coria RN Entered By: Carlene Coria on 12/27/2019 12:36:08 -------------------------------------------------------------------------------- Lower Extremity Assessment Details Patient Name: Date of Service: CASHA, SALEN 12/27/2019 11:15 AM Medical Record [id number] Patient Account Number: [id number] Date of Birth/Sex: Treating RN: 10/07/1931 (86 y.o. [name] Primary Care Akila Batta: Cristie Hem Other Clinician: Referring Mry Lamia: Treating Dotti Busey/Extender:Stone III, Wallie Char, LAURA Weeks in Treatment: 1 Edema Assessment Assessed: [Left: Yes] [Right: No] Edema: [Left: Ye] [Right: s] Calf Left: Right: Point of Measurement: 33 cm From Medial Instep 29 cm cm Ankle Left: Right: Point of Measurement: 11 cm From Medial Instep 19 cm cm Vascular Assessment Pulses: Dorsalis Pedis Palpable: [Left:Yes] Electronic Signature(s) Signed: 12/27/2019 5:46:37 PM By: Deon Pilling Entered By: Deon Pilling on 12/27/2019 12:04:49 -------------------------------------------------------------------------------- Multi-Disciplinary Care Plan Details Patient Name: Date of Service: EVANTHIA, CARTELLI 12/27/2019 11:15 AM Medical Record [id number] Patient Account Number: [id number] Date of Birth/Sex: Treating RN: 1932-02-08 (86 y.o. [name] Primary Care Rowen Hur: Cristie Hem Other Clinician: Referring Codey Burling: Treating Venise Ellingwood/Extender:Stone III, Wallie Char, LAURA Weeks in Treatment: 1 Active Inactive Abuse / Safety / Falls / Self Care Management Nursing Diagnoses: Potential for falls Goals: Patient will not  experience any injury related to falls Date Initiated: 12/20/2019 Target Resolution Date: 01/17/2020 Goal Status: Active Patient/caregiver will verbalize/demonstrate measures taken to prevent injury and/or falls Date Initiated: 12/20/2019 Target Resolution Date: 01/17/2020 Goal Status: Active Interventions: Assess fall risk on admission and as needed Assess impairment of mobility on admission and as needed per policy Notes: Wound/Skin Impairment Nursing Diagnoses: Impaired tissue integrity Knowledge deficit related to ulceration/compromised skin integrity Goals: Patient/caregiver will verbalize understanding of skin care regimen Date Initiated: 12/20/2019 Target Resolution Date: 01/17/2020 Goal Status: Active Ulcer/skin breakdown will have a volume reduction of 30% by week 4 Date Initiated: 12/20/2019 Target Resolution  Date: 01/17/2020 Goal Status: Active Interventions: Assess patient/caregiver ability to obtain necessary supplies Assess patient/caregiver ability to perform ulcer/skin care regimen upon admission and as needed Assess ulceration(s) every visit Provide education on ulcer and skin care Treatment Activities: Skin care regimen initiated : 12/20/2019 Topical wound management initiated : 12/20/2019 Notes: Electronic Signature(s) Signed: 12/27/2019 6:07:56 PM By: Baruch Gouty RN, BSN Entered By: Baruch Gouty on 12/27/2019 12:21:22 -------------------------------------------------------------------------------- Pain Assessment Details Patient Name: Date of Service: DRIANNA, STOLAR 12/27/2019 11:15 AM Medical Record [id number] Patient Account Number: [id number] Date of Birth/Sex: Treating RN: [birth year] (86 y.o. [name] Primary Care Alexys Gassett: Cristie Hem Other Clinician: Referring Khamil Lamica: Treating Melbourne Jakubiak/Extender:Stone III, Wallie Char, LAURA Weeks in Treatment: 1 Active Problems Location of Pain Severity and Description of Pain Patient Has Paino  Yes Site Locations Rate the pain. Current Pain Level: 6 Pain Management and Medication Current Pain Management: Electronic Signature(s) Signed: 12/27/2019 6:07:56 PM By: Baruch Gouty RN, BSN Signed: 01/03/2020 9:20:10 AM By: Sandre Kitty Entered By: Sandre Kitty on 12/27/2019 11:52:57 -------------------------------------------------------------------------------- Patient/Caregiver Education Details Patient Name: Date of Service: [name] 4/7/2021andnbsp11:15 AM Medical Record [id number] Patient Account Number: [id number] Date of Birth/Gender: Treating RN: 01/23/1932 (86 y.o. [name] Primary Care Physician: Cristie Hem Other Clinician: Referring Physician: Treating Physician/Extender:Stone III, Wallie Char, LAURA Weeks in Treatment: 1 Education Assessment Education Provided To: Patient Education Topics Provided Wound/Skin Impairment: Methods: Explain/Verbal Responses: Reinforcements needed, State content correctly Electronic Signature(s) Signed: 12/27/2019 6:07:56 PM By: Baruch Gouty RN, BSN Entered By: Baruch Gouty on 12/27/2019 12:21:49 -------------------------------------------------------------------------------- Wound Assessment Details Patient Name: Date of Service: ALANIZ, SKIDMORE 12/27/2019 11:15 AM Medical Record [id number] Patient Account Number: [id number] Date of Birth/Sex: Treating RN: [birth year] (86 y.o. [name] Primary Care Lowella Kindley: Cristie Hem Other Clinician: Referring Jakiyah Stepney: Treating Tawan Corkern/Extender:Stone III, Wallie Char, LAURA Weeks in Treatment: 1 Wound Status Wound Number: 1 Primary Skin Tear Etiology: Wound Location: Left, Lateral Lower Leg Secondary Diabetic Wound/Ulcer of the Lower Wounding Event: Trauma Etiology: Extremity Date Acquired: 10/23/2019 Wound Open Weeks Of Treatment: 1 Status: Clustered Wound: No Comorbid Coronary Artery Disease, Hypertension, History: Myocardial  Infarction, Type II Diabetes, Osteoarthritis, Confinement Anxiety Wound Measurements Length: (cm) 3.4 % Reduct Width: (cm) 2.9 % Reduct Depth: (cm) 0.1 Epitheli Area: (cm) 7.744 Tunneli Volume: (cm) 0.774 Undermi Wound Description Full Thickness Without Exposed Support Foul Od Classification: Structures Slough/ Wound Distinct, outline attached Margin: Exudate Medium Amount: Exudate Serosanguineous Type: Exudate red, brown Color: Wound Bed Granulation Amount: Small (1-33%) Granulation Quality: Red, Pink Fascia Necrotic Amount: Large (67-100%) Fat Lay Necrotic Quality: Adherent Slough Tendon Muscle Joint E Bone Ex or After Cleansing: No Fibrino Yes Exposed Structure Exposed: No er (Subcutaneous Tissue) Exposed: Yes Exposed: No Exposed: No xposed: No posed: No ion in Area: -1073.3% ion in Volume: -1072.7% alization: Small (1-33%) ng: No ning: No Assessment Notes redness noted to periwound. Treatment Notes Wound #1 (Left, Lateral Lower Leg) 1. Cleanse With Wound Cleanser Soap and water 3. Primary Dressing Applied Santyl Xeroform Gauze 4. Secondary Dressing Dry Gauze Roll Gauze 5. Secured With Recruitment consultant) Signed: 12/27/2019 5:46:37 PM By: Deon Pilling Signed: 12/27/2019 6:07:56 PM By: Baruch Gouty RN, BSN Entered By: Deon Pilling on 12/27/2019 12:05:42 -------------------------------------------------------------------------------- Wound Assessment Details Patient Name: Date of Service: KERRIGAN, RUPRIGHT 12/27/2019 11:15 AM Medical Record [id number] Patient Account Number: [id number] Date of Birth/Sex: Treating RN: 1932-08-03 (86 y.o. [name] Primary Care Jolita Haefner: Cristie Hem Other Clinician: Referring Devyn Sheerin: Treating Rogerick Baldwin/Extender:Stone III, Wallie Char, LAURA Weeks in Treatment:  1 Wound Status Wound Number: 2 Primary Abrasion Etiology: Wound Location: Left, Proximal, Anterior Lower Leg Secondary  Diabetic Wound/Ulcer of the Lower Wounding Event: Trauma Etiology: Extremity Date Acquired: 10/23/2019 Wound Status: Open Weeks Of Treatment: 1 Clustered Wound: No Wound Measurements Length: (cm) 0 Width: (cm) 0 Depth: (cm) 0 Area: (cm) 0 Volume: (cm) 0 Wound Description Full Thickness Without Exposed Suppo Classification: Structures % Reduction in Area: 100% % Reduction in Volume: 100% rt Electronic Signature(s) Signed: 12/27/2019 6:07:56 PM By: Baruch Gouty RN, BSN Signed: 01/03/2020 9:20:10 AM By: Sandre Kitty Entered By: Sandre Kitty on 12/27/2019 11:59:56 -------------------------------------------------------------------------------- Wound Assessment Details Patient Name: Date of Service: JALEEZA, SHIMP 12/27/2019 11:15 AM Medical Record [id number] Patient Account Number: [id number] Date of Birth/Sex: Treating RN: [birth year] (86 y.o. [name] Primary Care Ilai Hiller: Cristie Hem Other Clinician: Referring Buckley Bradly: Treating Shade Kaley/Extender:Stone III, Wallie Char, LAURA Weeks in Treatment: 1 Wound Status Wound Number: 3 Primary Abrasion Etiology: Wound Location: Left, Distal, Anterior Lower Leg Secondary Diabetic Wound/Ulcer of the Lower Wounding Event: Trauma Etiology: Extremity Date Acquired: 10/23/2019 Wound Open Weeks Of Treatment: 1 Status: Clustered Wound: No Comorbid Coronary Artery Disease, Hypertension, History: Myocardial Infarction, Type II Diabetes, Osteoarthritis, Confinement Anxiety Wound Measurements Length: (cm) 7.5 % Reduct Width: (cm) 2.3 % Reduct Depth: (cm) 0.1 Epitheli Area: (cm) 13.548 Tunneli Volume: (cm) 1.355 Undermi Wound Description Classification: Full Thickness Without Exposed Support Foul Od Structures Slough/ Wound Distinct, outline attached Margin: Exudate Medium Amount: Exudate Serosanguineous Type: Exudate red, brown Color: Wound Bed Granulation Amount: Medium (34-66%) Granulation  Quality: Red, Pink Fascia Necrotic Amount: Medium (34-66%) Fat Layer Necrotic Quality: Adherent Slough Tendon Ex Muscle Ex Joint Exp Bone Expo or After Cleansing: No Fibrino Yes Exposed Structure Exposed: No (Subcutaneous Tissue) Exposed: Yes posed: No posed: No osed: No sed: No ion in Area: 8% ion in Volume: 8% alization: Small (1-33%) ng: No ning: No Assessment Notes redness noted to periwound. Treatment Notes Wound #3 (Left, Distal, Anterior Lower Leg) 1. Cleanse With Wound Cleanser Soap and water 3. Primary Dressing Applied Santyl Xeroform Gauze 4. Secondary Dressing Dry Gauze Roll Gauze 5. Secured With Recruitment consultant) Signed: 12/27/2019 5:46:37 PM By: Deon Pilling Signed: 12/27/2019 6:07:56 PM By: Baruch Gouty RN, BSN Entered By: Deon Pilling on 12/27/2019 12:06:04 -------------------------------------------------------------------------------- Vitals Details Patient Name: Date of Service: NAEVIA, MACARIO 12/27/2019 11:15 AM Medical Record [id number] Patient Account Number: [id number] Date of Birth/Sex: Treating RN: [birth year] (86 y.o. [name] Primary Care Daiden Coltrane: Cristie Hem Other Clinician: Referring Salvatore Shear: Treating Stevana Dufner/Extender:Stone III, Wallie Char, LAURA Weeks in Treatment: 1 Vital Signs Time Taken: 11:52 Temperature (F): 98.4 Height (in): 67 Pulse (bpm): 60 Weight (lbs): 114 Respiratory Rate (breaths/min): 18 Body Mass Index (BMI): 17.9 Blood Pressure (mmHg): 114/51 Reference Range: 80 - 120 mg / dl Electronic Signature(s) Signed: 01/03/2020 9:20:10 AM By: Sandre Kitty Entered By: Sandre Kitty on 12/27/2019 11:52:53

## 2020-01-04 NOTE — Progress Notes (Addendum)
Karina, [name] ([id number]) Visit Report for 01/03/2020 Chief Complaint Document Details Patient Name: Date of Service: Karina, Karina [name] 01/03/2020 1:30 PM Medical Record [id number] Patient Account Number: [id number] Date of Birth/Sex: Treating RN: 12-Aug-1932 (86 y.o. Karina [name] Primary Care Provider: Cristie Hem Other Clinician: Referring Provider: Treating Provider/Extender:Stone III, Wallie Char, LAURA Weeks in Treatment: 2 Information Obtained from: Patient Chief Complaint Left LE skin tears Electronic Signature(s) Signed: 01/03/2020 1:36:43 PM By: Worthy Keeler PA-C Entered By: Worthy Keeler on 01/03/2020 13:36:43 -------------------------------------------------------------------------------- HPI Details Patient Name: Date of Service: Karina [name], [name] 01/03/2020 1:30 PM Medical Record [id number] Patient Account Number: [id number] Date of Birth/Sex: Treating RN: 1932/03/16 (86 y.o. Karina [name] Primary Care Provider: Cristie Hem Other Clinician: Referring Provider: Treating Provider/Extender:Stone III, Wallie Char, LAURA Weeks in Treatment: 2 History of Present Illness HPI Description: 12/20/2019 patient presents today for initial evaluation here in our clinic concerning issues that she unfortunately has been having with skin tears over the right lower extremity. This happened secondary to a fall that she had initially in December causing some skin tears that for the most part healed and then she sustained a second fall in February 2021. Subsequently the patient did have skin tears which they have been trying to manage at this point and home health has not really been put anything on it as they are waiting for orders from [place]. As far as the daughter is concerned with the patient she has been using Vaseline over the area at this point to try to keep the area moist and nothing would stick to it. The patient is having discomfort unfortunately. Nonetheless  I do not see any signs of active infection which is great news. She does have a history of diabetes mellitus type 2, hypertension, and is on anticoagulant therapy due to history of stroke. 12/27/2019 upon evaluation today patient appears to be doing well with regard to her wounds. They do appear to be somewhat dry however. She complains of the Xeroform having been sticking to the wound bed. Again not exactly sure why this would be the case at this point. Nonetheless there is no signs of infection I believe she may actually benefit from [place] currently. 01/03/20 upon evaluation today patient actually appears to be doing well with regard to her wounds at this time. I see evidence of improvement at both locations and fortunately there is no evidence of active infection at this time. No fevers, chills, nausea, vomiting, or diarrhea. Electronic Signature(s) Signed: 01/03/2020 2:50:30 PM By: Worthy Keeler PA-C Entered By: Worthy Keeler on 01/03/2020 14:50:30 -------------------------------------------------------------------------------- Physical Exam Details Patient Name: Date of Service: Karina, [name] 01/03/2020 1:30 PM Medical Record [id number] Patient Account Number: [id number] Date of Birth/Sex: Treating RN: 1932/04/17 (86 y.o. Karina [name] Primary Care Provider: Cristie Hem Other Clinician: Referring Provider: Treating Provider/Extender:Stone III, Wallie Char, LAURA Weeks in Treatment: 2 Constitutional Well-nourished and well-hydrated in no acute distress. Respiratory normal breathing without difficulty. Psychiatric this patient is able to make decisions and demonstrates good insight into disease process. Alert and Oriented x 3. pleasant and cooperative. Notes His wound beds currently did have some minimal slough noted on the surface of the wound. Fortunately there is no evidence of active infection and overall very pleased with how things seem to be progressing. No  fevers, chills, nausea, vomiting, or diarrhea. Electronic Signature(s) Signed: 01/03/2020 2:50:47 PM By: Worthy Keeler PA-C Entered By: Worthy Keeler on 01/03/2020 14:50:47 -------------------------------------------------------------------------------- Physician Orders Details Patient Name: Date  of Service: Karina, [name] 01/03/2020 1:30 PM Medical Record [id number] Patient Account Number: [id number] Date of Birth/Sex: Treating RN: July 26, 1932 (86 y.o. Karina [name] Primary Care Provider: Cristie Hem Other Clinician: Referring Provider: Treating Provider/Extender:Stone III, Wallie Char, LAURA Weeks in Treatment: 2 Verbal / Phone Orders: No Diagnosis Coding ICD-10 Coding Code Description S81.802A Unspecified open wound, left lower leg, initial encounter L97.822 Non-pressure chronic ulcer of other part of left lower leg with fat layer exposed E11.622 Type 2 diabetes mellitus with other skin ulcer I10 Essential (primary) hypertension Z79.01 Long term (current) use of anticoagulants Follow-up Appointments Return Appointment in 2 weeks. Dressing Change Frequency Wound #1 Left,Lateral Lower Leg Change dressing every day. Wound #3 Left,Distal,Anterior Lower Leg Change dressing every day. Wound Cleansing Clean wound with Wound Cleanser - all wounds May shower and wash wound with soap and water. Primary Wound Dressing Wound #1 Left,Lateral Lower Leg Medihoney gel - or manuk[place] honey Xeroform Wound #3 Left,Distal,Anterior Lower Leg Medihoney gel - [place] honey Xeroform - over santyl Secondary Dressing Wound #1 Left,Lateral Lower Leg Kerlix/Rolled Gauze ABD pad Wound #3 Left,Distal,Anterior Lower Leg Kerlix/Rolled Gauze ABD pad [place] [facility] for wound care. - [facility] Electronic Signature(s) Signed: 01/03/2020 4:44:47 PM By: Worthy Keeler PA-C Signed: 01/03/2020 5:43:24 PM By: Baruch Gouty RN, BSN Entered By: Baruch Gouty on 01/03/2020 14:48:40 -------------------------------------------------------------------------------- Problem List Details Patient Name: Date of Service: Karina, [name] 01/03/2020 1:30 PM Medical Record [id number] Patient Account Number: [id number] Date of Birth/Sex: Treating RN: Dec 02, 1931 (86 y.o. Karina [name] Primary Care Provider: Cristie Hem Other Clinician: Referring Provider: Treating Provider/Extender:Stone III, Wallie Char, LAURA Weeks in Treatment: 2 Active Problems ICD-10 Evaluated Encounter Code Description Active Date Today Diagnosis S81.802A Unspecified open wound, left lower leg, initial 12/20/2019 No Yes encounter L97.822 Non-pressure chronic ulcer of other part of left lower 12/20/2019 No Yes leg with fat layer exposed E11.622 Type 2 diabetes mellitus with other skin ulcer 12/20/2019 No Yes I10 Essential (primary) hypertension 12/20/2019 No Yes Z79.01 Long term (current) use of anticoagulants 12/20/2019 No Yes Inactive Problems Resolved Problems Electronic Signature(s) Signed: 01/03/2020 1:36:33 PM By: Worthy Keeler PA-C Entered By: Worthy Keeler on 01/03/2020 13:36:33 -------------------------------------------------------------------------------- Progress Note Details Patient Name: Date of Service: Karina, [name] 01/03/2020 1:30 PM Medical Record [id number] Patient Account Number: [id number] Date of Birth/Sex: Treating RN: November 02, 1931 (86 y.o. Karina [name] Primary Care Provider: Cristie Hem Other Clinician: Referring Provider: Treating Provider/Extender:Stone III, Wallie Char, LAURA Weeks in Treatment: 2 Subjective Chief Complaint Information obtained from Patient Left LE skin tears History of Present Illness (HPI) 12/20/2019 patient presents today for initial evaluation here in our clinic concerning issues that she unfortunately has been having with skin tears over the right lower extremity. This happened secondary to a  fall that she had initially in December causing some skin tears that for the most part healed and then she sustained a second fall in February 2021. Subsequently the patient did have skin tears which they have been trying to manage at this point and home health has not really been put anything on it as they are waiting for orders from [place]. As far as the daughter is concerned with the patient she has been using Vaseline over the area at this point to try to keep the area moist and nothing would stick to it. The patient is having discomfort unfortunately. Nonetheless I do not see any signs of active infection which is great news. She does have a  history of diabetes mellitus type 2, hypertension, and is on anticoagulant therapy due to history of stroke. 12/27/2019 upon evaluation today patient appears to be doing well with regard to her wounds. They do appear to be somewhat dry however. She complains of the Xeroform having been sticking to the wound bed. Again not exactly sure why this would be the case at this point. Nonetheless there is no signs of infection I believe she may actually benefit from [place] currently. 01/03/20 upon evaluation today patient actually appears to be doing well with regard to her wounds at this time. I see evidence of improvement at both locations and fortunately there is no evidence of active infection at this time. No fevers, chills, nausea, vomiting, or diarrhea. Objective Constitutional Well-nourished and well-hydrated in no acute distress. Vitals Time Taken: 1:57 PM, Height: 67 in, Weight: 114 lbs, BMI: 17.9, Temperature: 97.9 F, Pulse: 62 bpm, Respiratory Rate: 16 breaths/min, Blood Pressure: 123/49 mmHg. Respiratory normal breathing without difficulty. Psychiatric this patient is able to make decisions and demonstrates good insight into disease process. Alert and Oriented x 3. pleasant and cooperative. General Notes: His wound beds currently did have some  minimal slough noted on the surface of the wound. Fortunately there is no evidence of active infection and overall very pleased with how things seem to be progressing. No fevers, chills, nausea, vomiting, or diarrhea. Integumentary (Hair, Skin) Wound #1 status is Open. Original cause of wound was Trauma. The wound is located on the Left,Lateral Lower Leg. The wound measures 3.2cm length x 1.6cm width x 0.1cm depth; 4.021cm^2 area and 0.402cm^3 volume. There is Fat Layer (Subcutaneous Tissue) Exposed exposed. There is no tunneling or undermining noted. There is a medium amount of serosanguineous drainage noted. The wound margin is distinct with the outline attached to the wound base. There is large (67-100%) red, pink granulation within the wound bed. There is a small (1-33%) amount of necrotic tissue within the wound bed including Adherent Slough. Wound #3 status is Open. Original cause of wound was Trauma. The wound is located on the [facility] Lower Leg. The wound measures 6.5cm length x 2.4cm width x 0.1cm depth; 12.252cm^2 area and 1.225cm^3 volume. There is Fat Layer (Subcutaneous Tissue) Exposed exposed. There is no tunneling or undermining noted. There is a medium amount of serosanguineous drainage noted. The wound margin is distinct with the outline attached to the wound base. There is medium (34-66%) red, pink granulation within the wound bed. There is a medium (34-66%) amount of necrotic tissue within the wound bed including Adherent Slough. Assessment Active Problems ICD-10 Unspecified open wound, left lower leg, initial encounter Non-pressure chronic ulcer of other part of left lower leg with fat layer exposed Type 2 diabetes mellitus with other skin ulcer Essential (primary) hypertension Long term (current) use of anticoagulants Plan Follow-up Appointments: Return Appointment in 2 weeks. Dressing Change Frequency: Wound #1 Left,Lateral Lower Leg: Change dressing  every day. Wound #3 Left,Distal,Anterior Lower Leg: Change dressing every day. Wound Cleansing: Clean wound with Wound Cleanser - all wounds May shower and wash wound with soap and water. Primary Wound Dressing: Wound #1 Left,Lateral Lower Leg: Medihoney gel - or manuk[place] honey Xeroform Wound #3 Left,Distal,Anterior Lower Leg: Medihoney gel - [place] honey Xeroform - over santyl Secondary Dressing: Wound #1 Left,Lateral Lower Leg: Kerlix/Rolled Gauze ABD pad Wound #3 Left,Distal,Anterior Lower Leg: Kerlix/Rolled Gauze ABD pad Home Health: [place] [facility] for wound care. - [facility] 1. I Minna suggest at this point that  we go ahead and continue with the Medihoney unfortunately the Santyl was too expensive for the patient. 2. I am also can recommend that we continue to use Xeroform over top of the Medihoney in order to keep things nice and moist. 3. I would also recommend that we continue to use an ABD pad and roll gauze to secure all this in place. We will see patient back for reevaluation in 2 weeks here in the clinic. If anything worsens or changes patient will contact our office for additional recommendations. Electronic Signature(s) Signed: 01/03/2020 2:51:21 PM By: Worthy Keeler PA-C Entered By: Worthy Keeler on 01/03/2020 14:51:20 -------------------------------------------------------------------------------- SuperBill Details Patient Name: Date of Service: Karina, PASKE 01/03/2020 Medical Record [id number] Patient Account Number: [id number] Date of Birth/Sex: Treating RN: 15-Sep-1932 (86 y.o. Karina [name] Primary Care Provider: Cristie Hem Other Clinician: Referring Provider: Treating Provider/Extender:Stone III, Wallie Char, LAURA Weeks in Treatment: 2 Diagnosis Coding ICD-10 Codes Code Description [phone] Unspecified open wound, left lower leg, initial encounter L97.822 Non-pressure chronic ulcer of other part of left lower  leg with fat layer exposed E11.622 Type 2 diabetes mellitus with other skin ulcer I10 Essential (primary) hypertension Z79.01 Long term (current) use of anticoagulants Facility Procedures CPT4 Code: [id number] Description: 99214 - WOUND CARE VISIT-LEV 4 EST PT Modifier: Quantity: 1 Physician Procedures CPT4 Code Description: [id number] - WC PHYS LEVEL 3 - EST PT ICD-10 Diagnosis Description S81.802A Unspecified open wound, left lower leg, initial encount L97.822 Non-pressure chronic ulcer of other part of left lower E11.622 Type 2 diabetes mellitus  with other skin ulcer I10 Essential (primary) hypertension Modifier: er leg with fat laye Quantity: 1 r exposed Electronic Signature(s) Signed: 01/03/2020 2:51:33 PM By: Worthy Keeler PA-C Entered By: Worthy Keeler on 01/03/2020 14:51:32

## 2020-01-17 ENCOUNTER — Other Ambulatory Visit: Payer: Self-pay

## 2020-01-17 ENCOUNTER — Encounter (HOSPITAL_BASED_OUTPATIENT_CLINIC_OR_DEPARTMENT_OTHER): Payer: Medicare Other | Admitting: Physician Assistant

## 2020-01-17 DIAGNOSIS — E11622 Type 2 diabetes mellitus with other skin ulcer: Secondary | ICD-10-CM | POA: Diagnosis not present

## 2020-01-17 NOTE — Progress Notes (Addendum)
MARTITA, STIEFVATER ([id number]) Visit Report for 01/17/2020 Chief Complaint Document Details Patient Name: Date of Service: [name], [name] 01/17/2020 3:15 PM Medical Record Number: [id number] Patient Account Number: [id number] Date of Birth/Sex: Treating RN: May 03, 1932 (86 y.o. Nancy Fetter Primary Care Provider: Jan Fireman Other Clinician: Referring Provider: Treating Provider/Extender: Vira Agar, [place] URA Weeks in Treatment: 4 Information Obtained from: Patient Chief Complaint Left LE skin tears Electronic Signature(s) Signed: 01/17/2020 3:23:19 PM By: Worthy Keeler PA-C Entered By: Worthy Keeler on 01/17/2020 15:23:19 -------------------------------------------------------------------------------- HPI Details Patient Name: Date of Service: [name], [name] 01/17/2020 3:15 PM Medical Record Number: [id number] Patient Account Number: [id number] Date of Birth/Sex: Treating RN: 26-Feb-1932 (86 y.o. Nancy Fetter Primary Care Provider: Jan Fireman Other Clinician: Referring Provider: Treating Provider/Extender: Vira Agar, [place] URA Weeks in Treatment: 4 History of Present Illness HPI Description: 12/20/2019 patient presents today for initial evaluation here in our clinic concerning issues that she unfortunately has been having with skin tears over the right lower extremity. This happened secondary to a fall that she had initially in December causing some skin tears that for the most part healed and then she sustained a second fall in February 2021. Subsequently the patient did have skin tears which they have been trying to manage at this point and home health has not really been put anything on it as they are waiting for orders from [place]. As far as the daughter is concerned with the patient she has been using Vaseline over the area at this point to try to keep the area moist and nothing would stick to it. The patient is having discomfort  unfortunately. Nonetheless I do not see any signs of active infection which is great news. She does have a history of diabetes mellitus type 2, hypertension, and is on anticoagulant therapy due to history of stroke. 12/27/2019 upon evaluation today patient appears to be doing well with regard to her wounds. They do appear to be somewhat dry however. She complains of the Xeroform having been sticking to the wound bed. Again not exactly sure why this would be the case at this point. Nonetheless there is no signs of infection I believe she may actually benefit from [facility] currently. 01/03/20 upon evaluation today patient actually appears to be doing well with regard to her wounds at this time. I see evidence of improvement at both locations and fortunately there is no evidence of active infection at this time. No fevers, chills, nausea, vomiting, or diarrhea. 01/17/2020 upon evaluation today patient actually appears to be doing excellent in regard to her leg ulcer. This is actually healing quite nicely and overall I am very pleased with the progress that is been made. There is no evidence of active infection at this time. Electronic Signature(s) Signed: 01/17/2020 5:02:28 PM By: Worthy Keeler PA-C Entered By: Worthy Keeler on 01/17/2020 17:02:27 -------------------------------------------------------------------------------- Physical Exam Details Patient Name: Date of Service: [name], [name] 01/17/2020 3:15 PM Medical Record Number: [id number] Patient Account Number: [id number] Date of Birth/Sex: Treating RN: Apr 15, 1932 (86 y.o. Nancy Fetter Primary Care Provider: Jan Fireman Other Clinician: Referring Provider: Treating Provider/Extender: Vira Agar, [place] URA Weeks in Treatment: 4 Constitutional Well-nourished and well-hydrated in no acute distress. Respiratory normal breathing without difficulty. Psychiatric this patient is able to make decisions and demonstrates good insight  into disease process. Alert and Oriented x 3. pleasant and cooperative. Notes Patient's wound bed currently showed signs of excellent epithelization and  granulation there is no sign of active infection she has been using Medihoney followed by the Xeroform gauze which has done extremely well for her Electronic Signature(s) Signed: 01/17/2020 5:02:44 PM By: Worthy Keeler PA-C Entered By: Worthy Keeler on 01/17/2020 17:02:43 -------------------------------------------------------------------------------- Physician Orders Details Patient Name: Date of Service: [name], [name] 01/17/2020 3:15 PM Medical Record Number: [id number] Patient Account Number: [id number] Date of Birth/Sex: Treating RN: 1932/07/05 (86 y.o. Nancy Fetter Primary Care Provider: Jan Fireman Other Clinician: Referring Provider: Treating Provider/Extender: Vira Agar, [place] URA Weeks in Treatment: 4 Verbal / Phone Orders: No Diagnosis Coding ICD-10 Coding Code Description [phone] Unspecified open wound, left lower leg, initial encounter L97.822 Non-pressure chronic ulcer of other part of left lower leg with fat layer exposed E11.622 Type 2 diabetes mellitus with other skin ulcer I10 Essential (primary) hypertension Z79.01 Long term (current) use of anticoagulants Follow-up Appointments Return Appointment in 2 weeks. Dressing Change Frequency Wound #3 Left,Distal,Anterior Lower Leg Change dressing every day. Wound Cleansing Wound #3 Left,Distal,Anterior Lower Leg Clean wound with Wound Cleanser Primary Wound Dressing Wound #3 Left,Distal,Anterior Lower Leg Medihoney gel - or manuka honey Xeroform - over medihoney Secondary Dressing Wound #3 Left,Distal,Anterior Lower Leg Kerlix/Rolled Gauze Dry [facility] skilled [facility] for wound care. - [facility] Electronic Signature(s) Signed: 01/17/2020 5:55:37 PM By: Levan Hurst RN, BSN Signed: 01/19/2020 9:02:44 AM By:  Worthy Keeler PA-C Entered By: Levan Hurst on 01/17/2020 16:51:18 -------------------------------------------------------------------------------- Problem List Details Patient Name: Date of Service: [name], [name] 01/17/2020 3:15 PM Medical Record Number: [id number] Patient Account Number: [id number] Date of Birth/Sex: Treating RN: 07-Nov-1931 (86 y.o. Nancy Fetter Primary Care Provider: Jan Fireman Other Clinician: Referring Provider: Treating Provider/Extender: Vira Agar, [place] URA Weeks in Treatment: 4 Active Problems ICD-10 Encounter Code Description Active Date MDM Diagnosis S81.802A Unspecified open wound, left lower leg, initial encounter 12/20/2019 No Yes L97.822 Non-pressure chronic ulcer of other part of left lower leg with fat layer exposed3/31/2021 No Yes E11.622 Type 2 diabetes mellitus with other skin ulcer 12/20/2019 No Yes I10 Essential (primary) hypertension 12/20/2019 No Yes Z79.01 Long term (current) use of anticoagulants 12/20/2019 No Yes Inactive Problems Resolved Problems Electronic Signature(s) Signed: 01/17/2020 3:23:13 PM By: Worthy Keeler PA-C Entered By: Worthy Keeler on 01/17/2020 15:23:13 -------------------------------------------------------------------------------- Progress Note Details Patient Name: Date of Service: [name], [name] 01/17/2020 3:15 PM Medical Record Number: [id number] Patient Account Number: [id number] Date of Birth/Sex: Treating RN: 08/01/1932 (86 y.o. Nancy Fetter Primary Care Provider: Jan Fireman Other Clinician: Referring Provider: Treating Provider/Extender: Vira Agar, [place] URA Weeks in Treatment: 4 Subjective Chief Complaint Information obtained from Patient Left LE skin tears History of Present Illness (HPI) 12/20/2019 patient presents today for initial evaluation here in our clinic concerning issues that she unfortunately has been having with skin tears over the right lower  extremity. This happened secondary to a fall that she had initially in December causing some skin tears that for the most part healed and then she sustained a second fall in February 2021. Subsequently the patient did have skin tears which they have been trying to manage at this point and home health has not really been put anything on it as they are waiting for orders from [place]. As far as the daughter is concerned with the patient she has been using Vaseline over the area at this point to try to keep the area moist and nothing would stick to  it. The patient is having discomfort unfortunately. Nonetheless I do not see any signs of active infection which is great news. She does have a history of diabetes mellitus type 2, hypertension, and is on anticoagulant therapy due to history of stroke. 12/27/2019 upon evaluation today patient appears to be doing well with regard to her wounds. They do appear to be somewhat dry however. She complains of the Xeroform having been sticking to the wound bed. Again not exactly sure why this would be the case at this point. Nonetheless there is no signs of infection I believe she may actually benefit from [facility] currently. 01/03/20 upon evaluation today patient actually appears to be doing well with regard to her wounds at this time. I see evidence of improvement at both locations and fortunately there is no evidence of active infection at this time. No fevers, chills, nausea, vomiting, or diarrhea. 01/17/2020 upon evaluation today patient actually appears to be doing excellent in regard to her leg ulcer. This is actually healing quite nicely and overall I am very pleased with the progress that is been made. There is no evidence of active infection at this time. Objective Constitutional Well-nourished and well-hydrated in no acute distress. Vitals Time Taken: 4:00 PM, Height: 67 in, Weight: 114 lbs, BMI: 17.9, Temperature: 98.2 F, Pulse: 55 bpm, Respiratory Rate: 18  breaths/min, Blood Pressure: 116/44 mmHg. Respiratory normal breathing without difficulty. Psychiatric this patient is able to make decisions and demonstrates good insight into disease process. Alert and Oriented x 3. pleasant and cooperative. General Notes: Patient's wound bed currently showed signs of excellent epithelization and granulation there is no sign of active infection she has been using Medihoney followed by the Xeroform gauze which has done extremely well for her Integumentary (Hair, Skin) Wound #1 status is Healed - Epithelialized. Original cause of wound was Trauma. The wound is located on the Left,Lateral Lower Leg. The wound measures 0cm length x 0cm width x 0cm depth; 0cm^2 area and 0cm^3 volume. There is no tunneling or undermining noted. There is a none present amount of drainage noted. The wound margin is distinct with the outline attached to the wound base. There is no granulation within the wound bed. There is no necrotic tissue within the wound bed. Wound #3 status is Open. Original cause of wound was Trauma. The wound is located on the [facility] Lower Leg. The wound measures 2.5cm length x 1.9cm width x 0.1cm depth; 3.731cm^2 area and 0.373cm^3 volume. There is Fat Layer (Subcutaneous Tissue) Exposed exposed. There is no tunneling or undermining noted. There is a medium amount of serosanguineous drainage noted. The wound margin is distinct with the outline attached to the wound base. There is large (67-100%) red, pink granulation within the wound bed. There is no necrotic tissue within the wound bed. Assessment Active Problems ICD-10 Unspecified open wound, left lower leg, initial encounter Non-pressure chronic ulcer of other part of left lower leg with fat layer exposed Type 2 diabetes mellitus with other skin ulcer Essential (primary) hypertension Long term (current) use of anticoagulants Plan Follow-up Appointments: Return Appointment in 2  weeks. Dressing Change Frequency: Wound #3 Left,Distal,Anterior Lower Leg: Change dressing every day. Wound Cleansing: Wound #3 Left,Distal,Anterior Lower Leg: Clean wound with Wound Cleanser Primary Wound Dressing: Wound #3 Left,Distal,Anterior Lower Leg: Medihoney gel - or manuka honey Xeroform - over medihoney Secondary Dressing: Wound #3 Left,Distal,Anterior Lower Leg: Kerlix/Rolled Gauze Dry Gauze Home Health: [facility] skilled nursing for wound care. - [facility]. My suggestion  currently is good to be that we go ahead and continue with the wound care measures as before I do believe that this is doing excellent currently. We are using Medihoney underneath a Xeroform gauze to keep things moist. 2. With regard to the patient's dressing to cover I am going to suggest that we continue with the ABD pad and roll gauze to secure in place to keep any adhesive off of her leg. We will see patient back for reevaluation in 2 weeks here in the clinic. If anything worsens or changes patient will contact our office for additional recommendations. Electronic Signature(s) Signed: 01/17/2020 5:03:19 PM By: Worthy Keeler PA-C Entered By: Worthy Keeler on 01/17/2020 17:03:18 -------------------------------------------------------------------------------- SuperBill Details Patient Name: Date of Service: [name], [name] 01/17/2020 Medical Record Number: [id number] Patient Account Number: [id number] Date of Birth/Sex: Treating RN: 1931/10/01 (86 y.o. Nancy Fetter Primary Care Provider: Jan Fireman Other Clinician: Referring Provider: Treating Provider/Extender: Vira Agar, [place] URA Weeks in Treatment: 4 Diagnosis Coding ICD-10 Codes Code Description [phone] Unspecified open wound, left lower leg, initial encounter L97.822 Non-pressure chronic ulcer of other part of left lower leg with fat layer exposed E11.622 Type 2 diabetes mellitus with other skin ulcer I10  Essential (primary) hypertension Z79.01 Long term (current) use of anticoagulants Facility Procedures Physician Procedures : CPT4 Code Description Modifier [id number] - WC PHYS LEVEL 3 - EST PT ICD-10 Diagnosis Description S81.802A Unspecified open wound, left lower leg, initial encounter L97.822 Non-pressure chronic ulcer of other part of left lower leg with fat layer  exposed E11.622 Type 2 diabetes mellitus with other skin ulcer I10 Essential (primary) hypertension Quantity: 1 Electronic Signature(s) Signed: 01/17/2020 5:55:37 PM By: Levan Hurst RN, BSN Signed: 01/19/2020 9:02:44 AM By: Worthy Keeler PA-C Previous Signature: 01/17/2020 5:03:30 PM Version By: Worthy Keeler PA-C Entered By: Levan Hurst on 01/17/2020 17:37:56

## 2020-01-17 NOTE — Progress Notes (Addendum)
[name], [name] ([id number]) Visit Report for 01/17/2020 Arrival Information Details Patient Name: Date of Service: [name], [name] 01/17/2020 3:15 PM Medical Record Number: [id number] Patient Account Number: [id number] Date of Birth/Sex: Treating RN: 08/29/1932 (86 y.o. F) Kela Millin Primary Care Jamesetta Greenhalgh: Jan Fireman Other Clinician: Referring Gorgeous Newlun: Treating Kesia Dalto/Extender: Vira Agar, [place] URA Weeks in Treatment: 4 Visit Information History Since Last Visit Added or deleted any medications: No Patient Arrived: Cane Any new allergies or adverse reactions: No Arrival Time: 16:05 Had a fall or experienced change in No Accompanied By: family member activities of daily living that may affect Transfer Assistance: None risk of falls: Patient Identification Verified: Yes Signs or symptoms of abuse/neglect since last visito No Secondary Verification Process Completed: Yes Hospitalized since last visit: No Patient Requires Transmission-Based Precautions: No Implantable device outside of the clinic excluding No Patient Has Alerts: Yes cellular tissue based products placed in the center Patient Alerts: Patient on Blood Thinner since last visit: Has Dressing in Place as Prescribed: Yes Pain Present Now: No Electronic Signature(s) Signed: 01/17/2020 5:13:40 PM By: Kela Millin Entered By: Kela Millin on 01/17/2020 16:08:28 -------------------------------------------------------------------------------- Clinic Level of Care Assessment Details Patient Name: Date of Service: [name], [name] 01/17/2020 3:15 PM Medical Record Number: [id number] Patient Account Number: [id number] Date of Birth/Sex: Treating RN: August 09, 1932 (86 y.o. Nancy Fetter Primary Care Keleigh Kazee: Jan Fireman Other Clinician: Referring Jenesys Casseus: Treating Anica Alcaraz/Extender: Vira Agar, [place] URA Weeks in Treatment: 4 Clinic Level of Care Assessment Items TOOL 4 Quantity  Score X- 1 0 Use when only an EandM is performed on FOLLOW-UP visit ASSESSMENTS - Nursing Assessment / Reassessment X- 1 10 Reassessment of Co-morbidities (includes updates in patient status) X- 1 5 Reassessment of Adherence to Treatment Plan ASSESSMENTS - Wound and Skin A ssessment / Reassessment X - Simple Wound Assessment / Reassessment - one wound 1 5 _0  - 0 Complex Wound Assessment / Reassessment - multiple wounds _1  - 0 Dermatologic / Skin Assessment (not related to wound area) ASSESSMENTS - Focused Assessment _2  - 0 Circumferential Edema Measurements - multi extremities _3  - 0 Nutritional Assessment / Counseling / Intervention X- 1 5 Lower Extremity Assessment (monofilament, tuning fork, pulses) _4  - 0 Peripheral Arterial Disease Assessment (using hand held doppler) ASSESSMENTS - Ostomy and/or Continence Assessment and Care _5  - 0 Incontinence Assessment and Management _6  - 0 Ostomy Care Assessment and Management (repouching, etc.) PROCESS - Coordination of Care X - Simple Patient / Family Education for ongoing care 1 15 _7  - 0 Complex (extensive) Patient / Family Education for ongoing care X- 1 10 Staff obtains Programmer, systems, Records, T Results / Process Orders est X- 1 10 Staff telephones HHA, Nursing Homes / Clarify orders / etc _8  - 0 Routine Transfer to another Facility (non-emergent condition) _9  - 0 Routine Hospital Admission (non-emergent condition) _10  - 0 New Admissions / Biomedical engineer / Ordering NPWT Apligraf, etc. , _11  - 0 Emergency Hospital Admission (emergent condition) X- 1 10 Simple Discharge Coordination _12  - 0 Complex (extensive) Discharge Coordination PROCESS - Special Needs _13  - 0 Pediatric / Minor Patient Management _14  - 0 Isolation Patient Management _15  - 0 Hearing / Language / Visual special needs _16  - 0 Assessment of Community assistance (transportation, D/C planning, etc.) _17  - 0 Additional assistance / Altered  mentation _18  - 0 Support Surface(s) Assessment (bed, cushion, seat, etc.) INTERVENTIONS - Wound Cleansing / Measurement X - Simple Wound Cleansing - one wound 1 5 _19  - 0  Complex Wound Cleansing - multiple wounds X- 1 5 Wound Imaging (photographs - any number of wounds) _0  - 0 Wound Tracing (instead of photographs) X- 1 5 Simple Wound Measurement - one wound _1  - 0 Complex Wound Measurement - multiple wounds INTERVENTIONS - Wound Dressings X - Small Wound Dressing one or multiple wounds 1 10 _2  - 0 Medium Wound Dressing one or multiple wounds _3  - 0 Large Wound Dressing one or multiple wounds X- 1 5 Application of Medications - topical <[id number]>_4<\/VQQVZDGLOVFIEPPI>_9  - 0 Application of Medications - injection INTERVENTIONS - Miscellaneous _5  - 0 External ear exam _6  - 0 Specimen Collection (cultures, biopsies, blood, body fluids, etc.) _7  - 0 Specimen(s) / Culture(s) sent or taken to Lab for analysis _8  - 0 Patient Transfer (multiple staff / Civil Service fast streamer / Similar devices) _9  - 0 Simple Staple / Suture removal (25 or less) _10  - 0 Complex Staple / Suture removal (26 or more) _11  - 0 Hypo / Hyperglycemic Management (close monitor of Blood Glucose) _12  - 0 Ankle / Brachial Index (ABI) - do not check if billed separately X- 1 5 Vital Signs Has the patient been seen at the hospital within the last three years: Yes Total Score: 105 Level Of Care: New/Established - Level 3 Electronic Signature(s) Signed: 01/17/2020 5:55:37 PM By: Levan Hurst RN, BSN Entered By: Levan Hurst on 01/17/2020 17:37:42 -------------------------------------------------------------------------------- Encounter Discharge Information Details Patient Name: Date of Service: [name], [name] 01/17/2020 3:15 PM Medical Record Number: [id number] Patient Account Number: [id number] Date of Birth/Sex: Treating RN: 01-02-1932 (86 y.o. Nancy Fetter Primary Care Damali Broadfoot: Jan Fireman Other Clinician: Referring Paulla Mcclaskey: Treating  Zyon Rosser/Extender: Vira Agar, [place] URA Weeks in Treatment: 4 Encounter Discharge Information Items Discharge Condition: Stable Ambulatory Status: Cane Discharge Destination: Home Transportation: Private Auto Accompanied By: daughter Schedule Follow-up Appointment: Yes Clinical Summary of Care: Electronic Signature(s) Signed: 01/17/2020 5:12:26 PM By: Deon Pilling Entered By: Deon Pilling on 01/17/2020 16:56:05 -------------------------------------------------------------------------------- Lower Extremity Assessment Details Patient Name: Date of Service: [name], [name] 01/17/2020 3:15 PM Medical Record Number: [id number] Patient Account Number: [id number] Date of Birth/Sex: Treating RN: [birth year] (86 y.o. Clearnce Sorrel Primary Care Kingston Guiles: Jan Fireman Other Clinician: Referring Asheton Scheffler: Treating Naksh Radi/Extender: Vira Agar, [place] URA Weeks in Treatment: 4 Edema Assessment Assessed: [Left: No] [Right: No] Edema: [Left: Ye] [Right: s] Calf Left: Right: Point of Measurement: 33 cm From Medial Instep 28 cm cm Ankle Left: Right: Point of Measurement: 11 cm From Medial Instep 20 cm cm Vascular Assessment Pulses: Dorsalis Pedis Palpable: [Left:Yes] Electronic Signature(s) Signed: 01/17/2020 5:13:40 PM By: Kela Millin Entered By: Kela Millin on 01/17/2020 16:14:20 -------------------------------------------------------------------------------- Multi-Disciplinary Care Plan Details Patient Name: Date of Service: [name], [name] 01/17/2020 3:15 PM Medical Record Number: [id number] Patient Account Number: [id number] Date of Birth/Sex: Treating RN: Mar 11, 1932 (87 y.o. Nancy Fetter Primary Care Handsome Anglin: Jan Fireman Other Clinician: Referring Marelin Tat: Treating Samier Jaco/Extender: Vira Agar, [place] URA Weeks in Treatment: 4 Active Inactive Wound/Skin Impairment Nursing Diagnoses: Impaired tissue integrity Knowledge  deficit related to ulceration/compromised skin integrity Goals: Patient/caregiver will verbalize understanding of skin care regimen Date Initiated: 12/20/2019 Target Resolution Date: 02/16/2020 Goal Status: Active Ulcer/skin breakdown will have a volume reduction of 30% by week 4 Date Initiated: 12/20/2019 Date Inactivated: 01/17/2020 Target Resolution Date: 01/17/2020 Goal Status: Met Ulcer/skin breakdown will have a volume reduction of 50% by week 8 Date Initiated: 01/17/2020 Target Resolution Date: 02/16/2020 Goal Status: Active Interventions: Assess patient/caregiver ability to obtain  necessary supplies Assess patient/caregiver ability to perform ulcer/skin care regimen upon admission and as needed Assess ulceration(s) every visit Provide education on ulcer and skin care Treatment Activities: Skin care regimen initiated : 12/20/2019 Topical wound management initiated : 12/20/2019 Notes: Electronic Signature(s) Signed: 01/17/2020 5:55:37 PM By: Levan Hurst RN, BSN Entered By: Levan Hurst on 01/17/2020 17:29:35 -------------------------------------------------------------------------------- Pain Assessment Details Patient Name: Date of Service: [name], [name] 01/17/2020 3:15 PM Medical Record Number: [id number] Patient Account Number: [id number] Date of Birth/Sex: Treating RN: July 03, 1932 (86 y.o. F) Kela Millin Primary Care Liliana Brentlinger: Jan Fireman Other Clinician: Referring Leonie Amacher: Treating Mackenize Delgadillo/Extender: Vira Agar, [place] URA Weeks in Treatment: 4 Active Problems Location of Pain Severity and Description of Pain Patient Has Paino No Site Locations Pain Management and Medication Current Pain Management: Electronic Signature(s) Signed: 01/17/2020 5:13:40 PM By: Kela Millin Entered By: Kela Millin on 01/17/2020 16:07:47 -------------------------------------------------------------------------------- Patient/Caregiver Education  Details Patient Name: Date of Service: [name] 4/28/2021andnbsp3:15 PM Medical Record Number: [id number] Patient Account Number: [id number] Date of Birth/Gender: Treating RN: [birth year] (86 y.o. Nancy Fetter Primary Care Physician: Jan Fireman Other Clinician: Referring Physician: Treating Physician/Extender: Vira Agar, [place] URA Weeks in Treatment: 4 Education Assessment Education Provided To: Patient Education Topics Provided Wound/Skin Impairment: Methods: Explain/Verbal Responses: State content correctly Electronic Signature(s) Signed: 01/17/2020 5:55:37 PM By: Levan Hurst RN, BSN Entered By: Levan Hurst on 01/17/2020 17:29:45 -------------------------------------------------------------------------------- Wound Assessment Details Patient Name: Date of Service: [name], [name] 01/17/2020 3:15 PM Medical Record Number: [id number] Patient Account Number: [id number] Date of Birth/Sex: Treating RN: [birth year] (86 y.o. 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 Event: Trauma Etiology: Date Acquired: 10/23/2019 Wound Healed - Epithelialized Weeks Of Treatment: 4 Status: Clustered Wound: No Comorbid Coronary Artery Disease, Hypertension, Myocardial Infarction, History: Type II Diabetes, Osteoarthritis, Confinement Anxiety Wound Measurements Length: (cm) Width: (cm) Depth: (cm) Area: (cm) Volume: (cm) 0 % Reduction in Area: 100% 0 % Reduction in Volume: 100% 0 Epithelialization: Large (67-100%) 0 Tunneling: No 0 Undermining: No Wound Description Classification: Full Thickness Without Exposed Support Structures Wound Margin: Distinct, outline  attached Exudate Amount: None Present Foul Odor After Cleansing: No Slough/Fibrino No Wound Bed Granulation Amount: None Present (0%) Exposed Structure Necrotic Amount: None Present (0%) Fascia Exposed: No Fat Layer (Subcutaneous Tissue) Exposed: No Tendon Exposed: No Muscle Exposed: No Joint Exposed: No Bone Exposed: No Electronic Signature(s) Signed: 01/17/2020 5:13:40 PM By: Kela Millin Entered By: Kela Millin on 01/17/2020 16:16:21 -------------------------------------------------------------------------------- Wound Assessment Details Patient Name: Date of Service: [name], [name] 01/17/2020 3:15 PM Medical Record Number: [id number] Patient Account Number: [id number] Date of Birth/Sex: Treating RN: Dec 10, 1931 (86 y.o. 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 Event: Trauma Etiology: Date Acquired: 10/23/2019 Wound Open Weeks Of Treatment: 4 Status: Clustered Wound: Yes Clustered Wound: Yes Comorbid Coronary Artery Disease, Hypertension, Myocardial Infarction, History: Type II Diabetes, Osteoarthritis, Confinement Anxiety Wound Measurements Length: (cm) 2.5 Width: (cm) 1.9 Depth: (cm) 0.1 Clustered Quantity: 3 Area: (cm) 3.731 Volume: (cm) 0.373 % Reduction in Area: 74.7% %  Reduction in Volume: 74.7% Epithelialization: Small (1-33%) Tunneling: No Undermining: No Wound Description Classification: Full Thickness Without Exposed Support Structures Wound Margin: Distinct, outline attached Exudate Amount: Medium Exudate Type: Serosanguineous Exudate Color: red, brown Foul Odor After Cleansing: No Slough/Fibrino No Wound Bed Granulation Amount: Large  (67-100%) Exposed Structure Granulation Quality: Red, Pink Fascia Exposed: No Necrotic Amount: None Present (0%) Fat Layer (Subcutaneous Tissue) Exposed: Yes Tendon Exposed: No Muscle Exposed: No Joint Exposed: No Bone Exposed: No Treatment Notes Wound #3 (Left, Distal, Anterior Lower Leg) 1. Cleanse With Wound Cleanser Soap and water 3. Primary Dressing Applied Xeroform Gauze Other primary dressing (specifiy in notes) 4. Secondary Dressing Dry Gauze Roll Gauze 5. Secured With Medipore tape Notes medihoney under xeroform as primary. netting. Electronic Signature(s) Signed: 01/17/2020 5:13:40 PM By: Kela Millin Entered By: Kela Millin on 01/17/2020 16:17:02 -------------------------------------------------------------------------------- Vitals Details Patient Name: Date of Service: [name], [name] 01/17/2020 3:15 PM Medical Record Number: [id number] Patient Account Number: [id number] Date of Birth/Sex: Treating RN: [birth year] (86 y.o. F) Dwiggins, Shannon Primary Care Torben Soloway: Jan Fireman Other Clinician: Referring Maryrose Colvin: Treating Amberleigh Gerken/Extender: Vira Agar, [place] URA Weeks in Treatment: 4 Vital Signs Time Taken: 16:00 Temperature (F): 98.2 Height (in): 67 Pulse (bpm): 55 Weight (lbs): 114 Respiratory Rate (breaths/min): 18 Body Mass Index (BMI): 17.9 Blood Pressure (mmHg): 116/44 Reference Range: 80 - 120 mg / dl Electronic Signature(s) Signed: 01/17/2020 5:13:40 PM By: Kela Millin Entered By: Kela Millin on 01/17/2020 16:07:12

## 2020-01-29 ENCOUNTER — Other Ambulatory Visit: Payer: Self-pay

## 2020-01-29 ENCOUNTER — Encounter: Payer: Self-pay | Admitting: Pulmonary Disease

## 2020-01-29 ENCOUNTER — Ambulatory Visit (INDEPENDENT_AMBULATORY_CARE_PROVIDER_SITE_OTHER): Payer: Medicare Other | Admitting: Pulmonary Disease

## 2020-01-29 VITALS — BP 116/64 | HR 56 | Temp 98.1°F | Ht 67.0 in | Wt 118.0 lb

## 2020-01-29 DIAGNOSIS — J90 Pleural effusion, not elsewhere classified: Secondary | ICD-10-CM

## 2020-01-29 DIAGNOSIS — Z9689 Presence of other specified functional implants: Secondary | ICD-10-CM | POA: Diagnosis not present

## 2020-01-29 NOTE — Patient Instructions (Signed)
Thank you for visiting Dr. Valeta Harms at [facility]. Today we recommend the following:  Drain X1 this week.  Plan for removal next week   Return in about 9 days (around 02/07/2020) for w/ Dr. Tamala Julian - IPC removal .    Please do your part to reduce the spread of COVID-19.

## 2020-01-29 NOTE — Progress Notes (Signed)
Synopsis: Referred in Feb 2021 for new IPC, from [facility] by Michael Boston, MD  Subjective:   PATIENT ID: [name] GENDER: female DOB: [birth year], MRN: [id number]  Chief Complaint  Patient presents with  . Follow-up    Pt states she has been okay since last visit and denies any complaints.    86 yo FM, fall in dec 2020, not hospitalized but had left sided broken ribs, went back to urgent care and sent pulmonary due to knew effusion. Tapped new right sided effusion X5. Each time around 1 L of fluid. Sent to ED. Evaluated by thoracic surgery and decision made for inpatient IPC placement.  Today here for follow-up.  Leaving from [place] she has moved here to the [place] area to live with her daughter.  The indwelling pleural catheter was placed on February 16.  Since then they have been draining this daily.  Patient has had no pain or discomfort.  Site has been draining approximately 200 cc/day with daily drainage.  They have brought their log with them.  They were set up with [facility].  12/13/2019: Patient with IPC placed following history of trauma. Pleural fluid analysis from last office visit revealed multiple nucleated cells and 66% eosinophils. Otherwise has a low protein and LDH. She is still actively draining the effusion. She is draining approximately every other day with an average of 900 to 1000 cc/week. Otherwise is comfortable with no pain in the chest. Patient denies fevers chills night sweats. Home health has been assessing catheter and site.  01/29/2020: Here today for follow-up regarding IPC placement.  Patient was treated with prednisone for short course.  She did see a significant drop in her every other day drainage.  She is now down to approximately 60 cc of fluid every other day.  Overall no significant complaints at this time.  Otherwise doing well.   Past Medical History:  Diagnosis Date  . Coronary artery disease   . DM (diabetes mellitus) ([place])    . HTN (hypertension)   . Hyperlipidemia   . Myocardial infarct [facility]) 2012     Family History  Problem Relation Age of Onset  . Alzheimer's disease Mother   . Heart attack Father   . Heart disease Father   . Lymphoma Sister   . Heart failure Brother      Past Surgical History:  Procedure Laterality Date  . CARDIAC CATHETERIZATION    . CAROTID ENDARTERECTOMY    . CORONARY ANGIOPLASTY    . CORONARY STENT PLACEMENT      Social History   Socioeconomic History  . Marital status: Widowed    Spouse name: Not on file  . Number of children: Not on file  . Years of education: Not on file  . Highest education level: Not on file  Occupational History  . Not on file  Tobacco Use  . Smoking status: Former Smoker    Packs/day: 1.00    Years: 2.00    Pack years: 2.00    Types: Cigarettes    Quit date: 11/15/1969    Years since quitting: 50.2  . Smokeless tobacco: Never Used  Substance and Sexual Activity  . Alcohol use: Never  . Drug use: Never  . Sexual activity: Not on file  Other Topics Concern  . Not on file  Social History Narrative  . Not on file   Social Determinants of Health   Financial Resource Strain:   . Difficulty of Paying  Living Expenses:   Food Insecurity:   . Worried About Charity fundraiser in the Last Year:   . Arboriculturist in the Last Year:   Transportation Needs:   . Film/video editor (Medical):   Marland Kitchen Lack of Transportation (Non-Medical):   Physical Activity:   . Days of Exercise per Week:   . Minutes of Exercise per Session:   Stress:   . Feeling of Stress :   Social Connections:   . Frequency of Communication with Friends and Family:   . Frequency of Social Gatherings with Friends and Family:   . Attends Religious Services:   . Active Member of Clubs or Organizations:   . Attends Archivist Meetings:   Marland Kitchen Marital Status:   Intimate Partner Violence:   . Fear of Current or Ex-Partner:   . Emotionally Abused:   Marland Kitchen  Physically Abused:   . Sexually Abused:      Allergies  Allergen Reactions  . Lisinopril     cough     Outpatient Medications Prior to Visit  Medication Sig Dispense Refill  . Biotin 5000 MCG TABS Take 5,000 mcg by mouth daily.    . Cholecalciferol (VITAMIN D3 SUPER STRENGTH) 50 MCG (2000 UT) TABS Take 2,000 Units by mouth daily.    . clopidogrel (PLAVIX) 75 MG tablet Take 75 mg by mouth 3 (three) times a week. Take Mon, Wed, Fri    . levothyroxine (SYNTHROID) 25 MCG tablet Take 25 mcg by mouth daily before breakfast.    . losartan (COZAAR) 100 MG tablet Take 100 mg by mouth every evening.    . Metoprolol Tartrate 75 MG TABS Take 75 mg by mouth daily.     . pioglitazone (ACTOS) 15 MG tablet Take 15 mg by mouth daily.    . rosuvastatin (CRESTOR) 40 MG tablet Take 40 mg by mouth daily.    . sertraline (ZOLOFT) 100 MG tablet Take 100 mg by mouth at bedtime.    . methylPREDNISolone (MEDROL DOSEPAK) 4 MG TBPK tablet Take as directed on pack. 21 each 0   No facility-administered medications prior to visit.    Review of Systems  Constitutional: Negative for chills, fever, malaise/fatigue and weight loss.  HENT: Negative for hearing loss, sore throat and tinnitus.   Eyes: Negative for blurred vision and double vision.  Respiratory: Negative for cough, hemoptysis, sputum production, shortness of breath, wheezing and stridor.   Cardiovascular: Negative for chest pain, palpitations, orthopnea, leg swelling and PND.  Gastrointestinal: Negative for abdominal pain, constipation, diarrhea, heartburn, nausea and vomiting.  Genitourinary: Negative for dysuria, hematuria and urgency.  Musculoskeletal: Negative for joint pain and myalgias.  Skin: Negative for itching and rash.  Neurological: Negative for dizziness, tingling, weakness and headaches.  Endo/Heme/Allergies: Negative for environmental allergies. Does not bruise/bleed easily.  Psychiatric/Behavioral: Negative for depression. The patient  is not nervous/anxious and does not have insomnia.   All other systems reviewed and are negative.    Objective:  Physical Exam Vitals reviewed.  Constitutional:      General: She is not in acute distress.    Appearance: She is well-developed.  HENT:     Head: Normocephalic and atraumatic.     Mouth/Throat:     Pharynx: No oropharyngeal exudate.  Eyes:     Conjunctiva/sclera: Conjunctivae normal.     Pupils: Pupils are equal, round, and reactive to light.  Neck:     Vascular: No JVD.     Trachea: No  tracheal deviation.     Comments: Loss of supraclavicular fat Cardiovascular:     Rate and Rhythm: Normal rate and regular rhythm.     Heart sounds: S1 normal and S2 normal.     Comments: Distant heart tones Pulmonary:     Effort: No tachypnea or accessory muscle usage.     Breath sounds: No stridor. Decreased breath sounds (throughout all lung fields) present. No wheezing, rhonchi or rales.  Abdominal:     General: Bowel sounds are normal. There is no distension.     Palpations: Abdomen is soft.     Tenderness: There is no abdominal tenderness.  Musculoskeletal:        General: Deformity (muscle wasting ) present.  Skin:    General: Skin is warm and dry.     Capillary Refill: Capillary refill takes less than 2 seconds.     Findings: No rash.  Neurological:     Mental Status: She is alert and oriented to person, place, and time.  Psychiatric:        Behavior: Behavior normal.      Vitals:   01/29/20 1535  BP: 116/64  Pulse: (!) 56  Temp: 98.1 F (36.7 C)  TempSrc: Temporal  SpO2: 96%  Weight: 118 lb (53.5 kg)  Height: 5\' 7"  (1.702 m)   96% on RA BMI Readings from Last 3 Encounters:  01/29/20 18.48 kg/m  12/14/19 17.85 kg/m  12/13/19 17.92 kg/m   Wt Readings from Last 3 Encounters:  01/29/20 118 lb (53.5 kg)  12/14/19 114 lb (51.7 kg)  12/13/19 114 lb 6.4 oz (51.9 kg)     CBC No results found for: WBC, RBC, HGB, HCT, PLT, MCV, MCH, MCHC, RDW,  LYMPHSABS, MONOABS, EOSABS, BASOSABS    Results for LOVETA, CLONINGER (MRN [id number]) as of 12/13/2019 17:03  Ref. Range 11/16/2019 11:34  Color, Fluid Latest Ref Range: YELLOW  YELLOW  Total Nucleated Cell Count, Fluid Latest Ref Range: 0 - 1,000 cu mm 1,988 (H)  Lymphs, Fluid Latest Units: % 26  Eos, Fluid Latest Units: % 66  Appearance, Fluid Latest Ref Range: CLEAR  HAZY (A)  Other Cells, Fluid Latest Units: % CORRELATE WITH CYTOLOGY.  Neutrophil Count, Fluid Latest Ref Range: 0 - 25 % 1  Monocyte-Macrophage-Serous Fluid Latest Ref Range: 50 - 90 % 7 (L)    Chest Imaging: 11/16/2019: Chest x-ray Left-sided loculated effusion.  Chest drain in place looks like comes and over top of the rib and then down and then extends up into the chest. The patient's images have been independently reviewed by me.    12/13/2019 chest x-ray: Small loculated left-sided effusion, IVC in place unchanged. The patient's images have been independently reviewed by me.    Pulmonary Functions Testing Results: No flowsheet data found.     Assessment & Plan:     ICD-10-CM   1. Recurrent left pleural effusion  J90   2. Chest tube in place  Z96.89   3. Pleural effusion  J90     Discussion:  This is an 86 year old female, recurrent left-sided pleural effusion, history of IPC placement, currently draining around 60 cc every other day through manual siphon IPC device.  She did have 66% eosinophils on pleural fluid analysis however this is chronic as the effusion had been there for a while as well as IPC and for approximately a week or more at the time of this analysis.  I suspect this is from her history of trauma air within  the cavity pneumothorax and chronic IPC indwelling.  At this time it the fluid is started to taper off.  Plan: I suspect we should pull the IPC at this time. Recommend for her to hold off on drainage of the device for the next 5 to 7 days. If she does have a significant amount of volume  after a 5 to 7-day wait.  We can consider leaving it in. Otherwise I would recommend having the device pulled next week.  I am not in clinic next week but will have her set up appointment with one of my partners next week Dr. Tamala Julian on the 19th.   Current Outpatient Medications:  .  Biotin 5000 MCG TABS, Take 5,000 mcg by mouth daily., Disp: , Rfl:  .  Cholecalciferol (VITAMIN D3 SUPER STRENGTH) 50 MCG (2000 UT) TABS, Take 2,000 Units by mouth daily., Disp: , Rfl:  .  clopidogrel (PLAVIX) 75 MG tablet, Take 75 mg by mouth 3 (three) times a week. Take Mon, Wed, Fri, Disp: , Rfl:  .  levothyroxine (SYNTHROID) 25 MCG tablet, Take 25 mcg by mouth daily before breakfast., Disp: , Rfl:  .  losartan (COZAAR) 100 MG tablet, Take 100 mg by mouth every evening., Disp: , Rfl:  .  Metoprolol Tartrate 75 MG TABS, Take 75 mg by mouth daily. , Disp: , Rfl:  .  pioglitazone (ACTOS) 15 MG tablet, Take 15 mg by mouth daily., Disp: , Rfl:  .  rosuvastatin (CRESTOR) 40 MG tablet, Take 40 mg by mouth daily., Disp: , Rfl:  .  sertraline (ZOLOFT) 100 MG tablet, Take 100 mg by mouth at bedtime., Disp: , Rfl:     Garner Nash, DO [facility] 01/29/2020 4:06 PM

## 2020-01-31 ENCOUNTER — Encounter (HOSPITAL_BASED_OUTPATIENT_CLINIC_OR_DEPARTMENT_OTHER): Payer: Medicare Other | Attending: Physician Assistant | Admitting: Physician Assistant

## 2020-01-31 DIAGNOSIS — E11622 Type 2 diabetes mellitus with other skin ulcer: Secondary | ICD-10-CM | POA: Insufficient documentation

## 2020-01-31 DIAGNOSIS — L97822 Non-pressure chronic ulcer of other part of left lower leg with fat layer exposed: Secondary | ICD-10-CM | POA: Diagnosis not present

## 2020-01-31 DIAGNOSIS — X58XXXA Exposure to other specified factors, initial encounter: Secondary | ICD-10-CM | POA: Diagnosis not present

## 2020-01-31 DIAGNOSIS — Z7901 Long term (current) use of anticoagulants: Secondary | ICD-10-CM | POA: Diagnosis not present

## 2020-01-31 DIAGNOSIS — S81802A Unspecified open wound, left lower leg, initial encounter: Secondary | ICD-10-CM | POA: Diagnosis not present

## 2020-01-31 DIAGNOSIS — I10 Essential (primary) hypertension: Secondary | ICD-10-CM | POA: Insufficient documentation

## 2020-02-01 NOTE — Progress Notes (Signed)
BENI, SPURGEON ([id number]) Visit Report for 01/31/2020 Arrival Information Details Patient Name: Date of Service: [name], [name] 01/31/2020 12:30 PM Medical Record Number: [id number] Patient Account Number: [id number] Date of Birth/Sex: Treating RN: [birth year] (86 y.o. [name] Primary Care [place] Shaler: Jan Fireman Other Clinician: Referring Jayni Prescher: Treating Jhoan Schmieder/Extender: Vira Agar, [place] URA Weeks in Treatment: 6 Visit Information History Since Last Visit Added or deleted any medications: No Patient Arrived: Cane Any new allergies or adverse reactions: No Arrival Time: 12:23 Had a fall or experienced change in No Accompanied By: dgt activities of daily living that may affect Transfer Assistance: None risk of falls: Patient Identification Verified: Yes Signs or symptoms of abuse/neglect since last visito No Secondary Verification Process Completed: Yes Hospitalized since last visit: No Patient Requires Transmission-Based Precautions: No Implantable device outside of the clinic excluding No Patient Has Alerts: Yes cellular tissue based products placed in the center Patient Alerts: Patient on Blood Thinner since last visit: Has Dressing in Place as Prescribed: No Pain Present Now: No Notes wound is healed Electronic Signature(s) Signed: 02/01/2020 12:51:17 PM By: Baruch Gouty RN, BSN Entered By: Baruch Gouty on 01/31/2020 12:28:23 -------------------------------------------------------------------------------- Clinic Level of Care Assessment Details Patient Name: Date of Service: [name], [name] 01/31/2020 12:30 PM Medical Record Number: [id number] Patient Account Number: [id number] Date of Birth/Sex: Treating RN: 1931/12/08 (86 y.o. [name] Primary Care Channing Yeager: Jan Fireman Other Clinician: Referring Annaliz Aven: Treating Primo Innis/Extender: Vira Agar, [place] URA Weeks in Treatment: 6 Clinic Level of Care Assessment  Items TOOL 4 Quantity Score []  - 0 Use when only an EandM is performed on FOLLOW-UP visit ASSESSMENTS - Nursing Assessment / Reassessment X- 1 10 Reassessment of Co-morbidities (includes updates in patient status) X- 1 5 Reassessment of Adherence to Treatment Plan ASSESSMENTS - Wound and Skin A ssessment / Reassessment X - Simple Wound Assessment / Reassessment - one wound 1 5 []  - 0 Complex Wound Assessment / Reassessment - multiple wounds []  - 0 Dermatologic / Skin Assessment (not related to wound area) ASSESSMENTS - Focused Assessment []  - 0 Circumferential Edema Measurements - multi extremities []  - 0 Nutritional Assessment / Counseling / Intervention []  - 0 Lower Extremity Assessment (monofilament, tuning fork, pulses) []  - 0 Peripheral Arterial Disease Assessment (using hand held doppler) ASSESSMENTS - Ostomy and/or Continence Assessment and Care []  - 0 Incontinence Assessment and Management []  - 0 Ostomy Care Assessment and Management (repouching, etc.) PROCESS - Coordination of Care X - Simple Patient / Family Education for ongoing care 1 15 []  - 0 Complex (extensive) Patient / Family Education for ongoing care X- 1 10 Staff obtains Programmer, systems, Records, T Results / Process Orders est X- 1 10 Staff telephones HHA, Nursing Homes / Clarify orders / etc []  - 0 Routine Transfer to another Facility (non-emergent condition) []  - 0 Routine Hospital Admission (non-emergent condition) []  - 0 New Admissions / Biomedical engineer / Ordering NPWT Apligraf, etc. , []  - 0 Emergency Hospital Admission (emergent condition) X- 1 10 Simple Discharge Coordination []  - 0 Complex (extensive) Discharge Coordination PROCESS - Special Needs []  - 0 Pediatric / Minor Patient Management []  - 0 Isolation Patient Management []  - 0 Hearing / Language / Visual special needs []  - 0 Assessment of Community assistance (transportation, D/C planning, etc.) []  - 0 Additional  assistance / Altered mentation []  - 0 Support Surface(s) Assessment (bed, cushion, seat, etc.) INTERVENTIONS - Wound Cleansing / Measurement []  - 0 Simple Wound Cleansing - one  wound []  - 0 Complex Wound Cleansing - multiple wounds X- 1 5 Wound Imaging (photographs - any number of wounds) []  - 0 Wound Tracing (instead of photographs) []  - 0 Simple Wound Measurement - one wound []  - 0 Complex Wound Measurement - multiple wounds INTERVENTIONS - Wound Dressings []  - 0 Small Wound Dressing one or multiple wounds []  - 0 Medium Wound Dressing one or multiple wounds []  - 0 Large Wound Dressing one or multiple wounds []  - 0 Application of Medications - topical []  - 0 Application of Medications - injection INTERVENTIONS - Miscellaneous []  - 0 External ear exam []  - 0 Specimen Collection (cultures, biopsies, blood, body fluids, etc.) []  - 0 Specimen(s) / Culture(s) sent or taken to Lab for analysis []  - 0 Patient Transfer (multiple staff / Civil Service fast streamer / Similar devices) []  - 0 Simple Staple / Suture removal (25 or less) []  - 0 Complex Staple / Suture removal (26 or more) []  - 0 Hypo / Hyperglycemic Management (close monitor of Blood Glucose) []  - 0 Ankle / Brachial Index (ABI) - do not check if billed separately X- 1 5 Vital Signs Has the patient been seen at the hospital within the last three years: Yes Total Score: 75 Level Of Care: New/Established - Level 2 Electronic Signature(s) Signed: 02/01/2020 12:51:17 PM By: Baruch Gouty RN, BSN Entered By: Baruch Gouty on 01/31/2020 12:26:27 -------------------------------------------------------------------------------- Encounter Discharge Information Details Patient Name: Date of Service: [name], [name] 01/31/2020 12:30 PM Medical Record Number: [id number] Patient Account Number: [id number] Date of Birth/Sex: Treating RN: 1932/01/24 (86 y.o. [name] Primary Care Kendra Grissett: Jan Fireman Other  Clinician: Referring Barbara Ahart: Treating Letisia Schwalb/Extender: Vira Agar, [place] URA Weeks in Treatment: 6 Encounter Discharge Information Items Discharge Condition: Stable Ambulatory Status: Ambulatory Discharge Destination: Home Transportation: Private Auto Accompanied By: self Schedule Follow-up Appointment: Yes Clinical Summary of Care: Patient Declined Electronic Signature(s) Signed: 02/01/2020 12:51:17 PM By: Baruch Gouty RN, BSN Entered By: Baruch Gouty on 01/31/2020 12:27:03 -------------------------------------------------------------------------------- Patient/Caregiver Education Details Patient Name: Date of Service: [name], [name] 5/12/2021andnbsp12:30 PM Medical Record Number: [id number] Patient Account Number: [id number] Date of Birth/Gender: Treating RN: March 19, 1932 (86 y.o. [name] Primary Care Physician: Jan Fireman Other Clinician: Referring Physician: Treating Physician/Extender: Vira Agar, LA URA Weeks in Treatment: 6 Education Assessment Education Provided To: Patient Education Topics Provided Wound/Skin Impairment: Methods: Explain/Verbal Responses: Reinforcements needed, State content correctly Electronic Signature(s) Signed: 02/01/2020 12:51:17 PM By: Baruch Gouty RN, BSN Entered By: Baruch Gouty on 01/31/2020 12:26:50 -------------------------------------------------------------------------------- Wound Assessment Details Patient Name: Date of Service: [name], [name] 01/31/2020 12:30 PM Medical Record Number: [id number] Patient Account Number: [id number] Date of Birth/Sex: Treating RN: 02-01-1932 (86 y.o. 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 Event: Trauma Etiology: Date Acquired: 10/23/2019 Wound Healed - Epithelialized Weeks Of Treatment: 6 Status: Clustered Wound: Yes Comorbid Coronary Artery Disease, Hypertension, Myocardial Infarction, History: Type II Diabetes, Osteoarthritis, Confinement Anxiety Wound Measurements Length: (cm) Width: (cm) Depth: (cm) Area: (cm) Volume: (cm) 0 % Reduction in Area: 100% 0 % Reduction in Volume: 100% 0 Epithelialization: Large (67-100%) 0 Tunneling: No 0 Undermining: No Wound Description Classification: Full Thickness Without Exposed Support Structures Wound Margin: Indistinct, nonvisible Exudate Amount: None Present Foul Odor After Cleansing:  No Slough/Fibrino No Wound Bed Granulation Amount: None Present (0%) Exposed Structure Necrotic Amount: None Present (0%) Fascia Exposed: No Fat Layer (Subcutaneous Tissue) Exposed: No Tendon Exposed: No Muscle Exposed: No Joint Exposed: No Bone Exposed: No Electronic Signature(s) Signed: 02/01/2020 12:51:17 PM By: Baruch Gouty RN, BSN Entered By: Baruch Gouty on 01/31/2020 12:25:21 -------------------------------------------------------------------------------- [place] Details Patient Name: Date of Service: [name], [name] 01/31/2020 12:30 PM Medical Record Number: [id number] Patient Account Number: [id number] Date of Birth/Sex: Treating RN: Sep 15, 1932 (86 y.o. [name] Primary Care Donnie Panik: Jan Fireman Other Clinician: Referring Vita Currin: Treating Montez Stryker/Extender: Vira Agar, [place] URA Weeks in Treatment: 6 Vital Signs Time Taken: 12:23 Temperature (F): 97.8 Height (in): 67 Pulse (bpm): 58 Source: Stated Respiratory Rate (breaths/min): 18 Weight (lbs): 114 Blood Pressure (mmHg): 146/57 Source: Stated Reference Range: 80 - 120 mg / dl Body Mass Index (BMI): 17.9 Electronic Signature(s) Signed: 02/01/2020 12:51:17 PM By: Baruch Gouty RN,  BSN Entered By: Baruch Gouty on 01/31/2020 12:28:16

## 2020-02-07 ENCOUNTER — Other Ambulatory Visit: Payer: Self-pay

## 2020-02-07 ENCOUNTER — Ambulatory Visit (INDEPENDENT_AMBULATORY_CARE_PROVIDER_SITE_OTHER): Payer: Medicare Other | Admitting: Internal Medicine

## 2020-02-07 ENCOUNTER — Encounter: Payer: Self-pay | Admitting: Internal Medicine

## 2020-02-07 VITALS — BP 118/58 | HR 57 | Temp 97.4°F | Ht 67.5 in | Wt 114.8 lb

## 2020-02-07 DIAGNOSIS — J90 Pleural effusion, not elsewhere classified: Secondary | ICD-10-CM

## 2020-02-07 NOTE — Progress Notes (Signed)
02/07/20   S: Here for pleural effusion f/u. Here with daughter Low output from aspira persists.  O: Today's Vitals   02/07/20 1446  BP: (!) 118/58  Pulse: (!) 57  Temp: (!) 97.4 F (36.3 C)  TempSrc: Temporal  SpO2: 98%  Weight: 114 lb 12.8 oz (52.1 kg)  Height: 5' 7.5" (1.715 m)   Body mass index is 17.71 kg/m. Thin elderly woman in NAD Heart sounds regular, slightly bradycardic, ext warm [place] of L chest with small remaining basilar effusion L aspira site looks CDI   A:  Recurrent eosinophilic effusion, aspira now pretty much done draining, warrants removal.  P: - Remove tunneled catheter - Local site care discussed with daughter - f/u in 3 weeks for site recheck and CXR chest  Erskine Emery MD PCCM  MDM: 24 minutes spent on this visit of which > 50% face to face

## 2020-02-07 NOTE — Patient Instructions (Signed)
-   Daily dressing changes x 3 days then can go without - Call if does not scab up within a week - Come back in 3 weeks with chest X-ray to see either me or Dr. Valeta Harms - Have a good day!

## 2020-02-07 NOTE — Procedures (Signed)
  Procedure: tunneled pleural catheter removal.  Indication: no longer needs  Consent: verbal  Description Patient positioned and gentle traction held on pleural catheter until removed en bloc.  No immediate complications.  Site cleaned and dressed.  Erskine Emery MD PCCM

## 2020-02-12 NOTE — Progress Notes (Signed)
[name] ([id number]) Visit Report for 01/03/2020 Arrival Information Details Patient Name: Date of Service: [name], [name] 01/03/2020 1:30 PM Medical Record Number: [id number] Patient Account Number: [id number] Date of Birth/Sex: Treating RN: [name] (86 y.o. [name] Primary Care [name]: [name] Other Clinician: Referring [name]: Treating Leonides Minder/Extender: Vira Agar, [place] URA Weeks in Treatment: 2 Visit Information History Since Last Visit Added or deleted any medications: No Patient Arrived: Cane Any new allergies or adverse reactions: No Arrival Time: 13:55 Had a fall or experienced change in No Accompanied By: daughter activities of daily living that may affect Transfer Assistance: None risk of falls: Patient Identification Verified: Yes Signs or symptoms of abuse/neglect since last visito No Secondary Verification Process Completed: Yes Hospitalized since last visit: No Patient Requires Transmission-Based Precautions: No Implantable device outside of the clinic excluding No Patient Has Alerts: Yes cellular tissue based products placed in the center Patient Alerts: Patient on Blood Thinner since last visit: Has Dressing in Place as Prescribed: Yes Pain Present Now: No Electronic Signature(s) Signed: 01/03/2020 5:43:43 PM By: [name] Entered By: Karina Hurst on 01/03/2020 13:55:35 -------------------------------------------------------------------------------- Clinic Level of Care Assessment Details Patient Name: Date of Service: [name] 01/03/2020 1:30 PM Medical Record Number: [id number] Patient Account Number: [id number] Date of Birth/Sex: Treating RN: [name] (86 y.o. [name] Primary Care [name]: [name] Other Clinician: Referring [name]: Treating [name]/Extender: Vira Agar, [place] URA Weeks in Treatment: 2 Clinic Level of Care Assessment Items TOOL 4 Quantity Score []   - 0 Use when only an EandM is performed on FOLLOW-UP visit ASSESSMENTS - Nursing Assessment / Reassessment X- 1 10 Reassessment of Co-morbidities (includes updates in patient status) X- 1 5 Reassessment of Adherence to Treatment Plan ASSESSMENTS - Wound and Skin A ssessment / Reassessment []  - 0 Simple Wound Assessment / Reassessment - one wound X- 2 5 Complex Wound Assessment / Reassessment - multiple wounds []  - 0 Dermatologic / Skin Assessment (not related to wound area) ASSESSMENTS - Focused Assessment []  - 0 Circumferential Edema Measurements - multi extremities []  - 0 Nutritional Assessment / Counseling / Intervention X- 1 5 Lower Extremity Assessment (monofilament, tuning fork, pulses) []  - 0 Peripheral Arterial Disease Assessment (using hand held doppler) ASSESSMENTS - Ostomy and/or Continence Assessment and Care []  - 0 Incontinence Assessment and Management []  - 0 Ostomy Care Assessment and Management (repouching, etc.) PROCESS - Coordination of Care X - Simple Patient / Family Education for ongoing care 1 15 []  - 0 Complex (extensive) Patient / Family Education for ongoing care X- 1 10 Staff obtains Programmer, systems, Records, T Results / Process Orders est []  - 0 Staff telephones HHA, Nursing Homes / Clarify orders / etc []  - 0 Routine Transfer to another Facility (non-emergent condition) []  - 0 Routine Hospital Admission (non-emergent condition) []  - 0 New Admissions / Biomedical engineer / Ordering NPWT Apligraf, etc. , []  - 0 Emergency Hospital Admission (emergent condition) X- 1 10 Simple Discharge Coordination []  - 0 Complex (extensive) Discharge Coordination PROCESS - Special Needs []  - 0 Pediatric / Minor Patient Management []  - 0 Isolation Patient Management []  - 0 Hearing / Language / Visual special needs []  - 0 Assessment of Community assistance (transportation, D/C planning, etc.) []  - 0 Additional assistance / Altered mentation []  -  0 Support Surface(s) Assessment (bed, cushion, seat, etc.) INTERVENTIONS - Wound Cleansing / Measurement []  - 0 Simple Wound Cleansing - one wound X- 2 5 Complex  Wound Cleansing - multiple wounds X- 1 5 Wound Imaging (photographs - any number of wounds) []  - 0 Wound Tracing (instead of photographs) []  - 0 Simple Wound Measurement - one wound X- 2 5 Complex Wound Measurement - multiple wounds INTERVENTIONS - Wound Dressings X - Small Wound Dressing one or multiple wounds 2 10 []  - 0 Medium Wound Dressing one or multiple wounds []  - 0 Large Wound Dressing one or multiple wounds X- 1 5 Application of Medications - topical []  - 0 Application of Medications - injection INTERVENTIONS - Miscellaneous []  - 0 External ear exam []  - 0 Specimen Collection (cultures, biopsies, blood, body fluids, etc.) []  - 0 Specimen(s) / Culture(s) sent or taken to Lab for analysis []  - 0 Patient Transfer (multiple staff / Civil Service fast streamer / Similar devices) []  - 0 Simple Staple / Suture removal (25 or less) []  - 0 Complex Staple / Suture removal (26 or more) []  - 0 Hypo / Hyperglycemic Management (close monitor of Blood Glucose) []  - 0 Ankle / Brachial Index (ABI) - do not check if billed separately X- 1 5 Vital Signs Has the patient been seen at the hospital within the last three years: Yes Total Score: 120 Level Of Care: New/Established - Level 4 Electronic Signature(s) Signed: 01/03/2020 5:43:24 PM By: Baruch Gouty RN, Pitts Entered By: Baruch Gouty on 01/03/2020 14:45:30 -------------------------------------------------------------------------------- Encounter Discharge Information Details Patient Name: Date of Service: Karina, Pitts 01/03/2020 1:30 PM Medical Record Number: [id number] Patient Account Number: [id number] Date of Birth/Sex: Treating RN: [name] (86 y.o. [name] Primary Care Riyanna Crutchley: [name] Other Clinician: Referring Ashey Tramontana: Treating  Jerrika Ledlow/Extender: Vira Agar, [place] URA Weeks in Treatment: 2 Encounter Discharge Information Items Discharge Condition: Stable Ambulatory Status: Cane Discharge Destination: Home Transportation: Private Auto Accompanied By: daughter Schedule Follow-up Appointment: Yes Clinical Summary of Care: Electronic Signature(s) Signed: 02/12/2020 1:31:05 PM By: Deon Pilling Entered By: Deon Pilling on 01/03/2020 15:05:46 -------------------------------------------------------------------------------- Lower Extremity Assessment Details Patient Name: Date of Service: [name], [name] 01/03/2020 1:30 PM Medical Record Number: [id number] Patient Account Number: [id number] Date of Birth/Sex: Treating RN: Jun [birth year] Pitts (86 y.o. [name] Primary Care Dynastee Brummell: [name] Other Clinician: Referring Rainie Crenshaw: Treating Othelia Riederer/Extender: Vira Agar, [place] URA Weeks in Treatment: 2 Edema Assessment Assessed: [Left: No] [Right: No] Edema: [Left: Ye] [Right: s] Calf Left: Right: Point of Measurement: 33 cm From Medial Instep 28 cm cm Ankle Left: Right: Point of Measurement: 11 cm From Medial Instep 20 cm cm Vascular Assessment Pulses: Dorsalis Pedis Palpable: [Left:Yes] Electronic Signature(s) Signed: 01/03/2020 5:43:43 PM By: [name] Entered By: Karina Hurst on 01/03/2020 14:02:44 -------------------------------------------------------------------------------- [employer] Details Patient Name: Date of Service: [name], [name] 01/03/2020 1:30 PM Medical Record Number: [id number] Patient Account Number: [id number] Date of Birth/Sex: Treating RN: [birth year]s (86 y.o. [name] Primary Care Vali Capano: [name] Other Clinician: Referring Fawzi Melman: Treating Rockney Grenz/Extender: Vira Agar, [place] URA Weeks in Treatment: 2 Active Inactive Abuse / Safety / Falls / Self Care Management Nursing Diagnoses: Potential for  falls Goals: Patient will not experience any injury related to falls Date Initiated: 12/20/2019 Target Resolution Date: 01/17/2020 Goal Status: Active Patient/caregiver will verbalize/demonstrate measures taken to prevent injury and/or falls Date Initiated: 12/20/2019 Target Resolution Date: 01/17/2020 Goal Status: Active Interventions: Assess fall risk on admission and as needed Assess impairment of mobility on admission and as needed per policy Notes: Wound/Skin Impairment Nursing Diagnoses: Impaired tissue integrity Knowledge deficit related  to ulceration/compromised skin integrity Goals: Patient/caregiver will verbalize understanding of skin care regimen Date Initiated: 12/20/2019 Target Resolution Date: 01/17/2020 Goal Status: Active Ulcer/skin breakdown will have a volume reduction of 30% by week 4 Date Initiated: 12/20/2019 Target Resolution Date: 01/17/2020 Goal Status: Active Interventions: Assess patient/caregiver ability to obtain necessary supplies Assess patient/caregiver ability to perform ulcer/skin care regimen upon admission and as needed Assess ulceration(s) every visit Provide education on ulcer and skin care Treatment Activities: Skin care regimen initiated : 12/20/2019 Topical wound management initiated : 12/20/2019 Notes: Electronic Signature(s) Signed: 01/03/2020 5:43:24 PM By: Baruch Gouty RN, Pitts Entered By: Baruch Gouty on 01/03/2020 14:44:36 -------------------------------------------------------------------------------- Pain Assessment Details Patient Name: Date of Service: [name], [name] 01/03/2020 1:30 PM Medical Record Number: [id number] Patient Account Number: [id number] Date of Birth/Sex: Treating RN: [birth year] (86 y.o. [name] Primary Care Sharina Petre: [name] Other Clinician: Referring Kenyanna Grzesiak: Treating Jefte Carithers/Extender: Vira Agar, [place] URA Weeks in Treatment: 2 Active Problems Location of Pain Severity and  Description of Pain Patient Has Paino No Site Locations Pain Management and Medication Current Pain Management: Electronic Signature(s) Signed: 01/03/2020 5:43:43 PM By: [name] Entered By: Karina Hurst on 01/03/2020 13:57:21 -------------------------------------------------------------------------------- Patient/Caregiver Education Details Patient Name: Date of Service: [name], [name] 4/14/2021andnbsp1:30 PM Medical Record Number: [id number] Patient Account Number: [id number] Date of Birth/Gender: Treating RN: Jun [birth year], Pitts (86 y.o. [name] Primary Care Physician: [name] Other Clinician: Referring Physician: Treating Physician/Extender: Vira Agar, [place] URA Weeks in Treatment: 2 Education Assessment Education Provided To: Patient Education Topics Provided Wound/Skin Impairment: Methods: Explain/Verbal Responses: Reinforcements needed, State content correctly Electronic Signature(s) Signed: 01/03/2020 5:43:24 PM By: Baruch Gouty RN, Pitts Entered By: Baruch Gouty on 01/03/2020 14:44:53 -------------------------------------------------------------------------------- Wound Assessment Details Patient Name: Date of Service: [name], [name] 01/03/2020 1:30 PM Medical Record Number: [id number] Patient Account Number: [id number] Date of Birth/Sex: Treating RN: Pitts[birth year] (86 y.o. [name] Primary Care Carinne Brandenburger: [name] Other Clinician: Referring Jai Steil: Treating Lessa Huge/Extender: Vira Agar, [place] URA Weeks in Treatment: 2 Wound Status Wound Number: 1 Primary Skin Tear Etiology: Wound Location: Left, Lateral Lower Leg Secondary Diabetic Wound/Ulcer of the Lower Extremity Wounding Event: Trauma Etiology: Date Acquired: 10/23/2019 Wound Open Weeks Of Treatment: 2 Status: Clustered Wound: No Comorbid Coronary Artery Disease, Hypertension, Myocardial Infarction, History: Type II Diabetes, Osteoarthritis,  Confinement Anxiety Photos Photo Uploaded By: Mikeal Hawthorne on 01/04/2020 08:49:10 Wound Measurements Length: (cm) 3.2 Width: (cm) 1.6 Depth: (cm) 0.1 Area: (cm) 4.021 Volume: (cm) 0.402 % Reduction in Area: -509.2% % Reduction in Volume: -509.1% Epithelialization: Small (1-33%) Tunneling: No Undermining: No Wound Description Classification: Full Thickness Without Exposed Support Structures Wound Margin: Distinct, outline attached Exudate Amount: Medium Exudate Type: Serosanguineous Exudate Color: red, brown Foul Odor After Cleansing: No Slough/Fibrino Yes Wound Bed Granulation Amount: Large (67-100%) Exposed Structure Granulation Quality: Red, Pink Fascia Exposed: No Necrotic Amount: Small (1-33%) Fat Layer (Subcutaneous Tissue) Exposed: Yes Necrotic Quality: Adherent Slough Tendon Exposed: No Muscle Exposed: No Joint Exposed: No Bone Exposed: No Electronic Signature(s) Signed: 01/03/2020 5:43:43 PM By: [name] Entered By: Karina Hurst on 01/03/2020 14:04:21 -------------------------------------------------------------------------------- Wound Assessment Details Patient Name: Date of Service: [name], [name] 01/03/2020 1:30 PM Medical Record Number: [id number] Patient Account Number: [id number] Date of Birth/Sex: Treating RN: Pitts[birth year] (86 y.o. [name] Primary Care Elio Haden: [name] Other Clinician: Referring Durk Carmen: Treating Mahaley Schwering/Extender: Vira Agar, [place] URA Weeks in Treatment: 2 Wound Status Wound Number: 3  Primary Abrasion Etiology: Wound Location: Left, Distal, Anterior Lower Leg Secondary Diabetic Wound/Ulcer of the Lower Extremity Wounding Event: Trauma Etiology: Date Acquired: 10/23/2019 Wound Open Weeks Of Treatment: 2 Status: Clustered Wound: No Comorbid Coronary Artery Disease, Hypertension, Myocardial Infarction, History: Type II Diabetes, Osteoarthritis, Confinement Anxiety Photos Photo  Uploaded By: Mikeal Hawthorne on 01/04/2020 08:49:11 Wound Measurements Length: (cm) 6.5 Width: (cm) 2.4 Depth: (cm) 0.1 Area: (cm) 12.252 Volume: (cm) 1.225 % Reduction in Area: 16.8% % Reduction in Volume: 16.8% Epithelialization: Small (1-33%) Tunneling: No Undermining: No Wound Description Classification: Full Thickness Without Exposed Support Structures Wound Margin: Distinct, outline attached Exudate Amount: Medium Exudate Type: Serosanguineous Exudate Color: red, brown Foul Odor After Cleansing: No Slough/Fibrino Yes Wound Bed Granulation Amount: Medium (34-66%) Exposed Structure Granulation Quality: Red, Pink Fascia Exposed: No Necrotic Amount: Medium (34-66%) Fat Layer (Subcutaneous Tissue) Exposed: Yes Necrotic Quality: Adherent Slough Tendon Exposed: No Muscle Exposed: No Joint Exposed: No Bone Exposed: No Electronic Signature(s) Signed: 01/03/2020 5:43:43 PM By: [name] Signed: 01/03/2020 5:43:43 PM By: [name] Entered By: Karina Hurst on 01/03/2020 14:04:29 -------------------------------------------------------------------------------- [place] Details Patient Name: Date of Service: [name], [name] 01/03/2020 1:30 PM Medical Record Number: [id number] Patient Account Number: [id number] Date of Birth/Sex: Treating RN: [birth year] (86 y.o. [name] Primary Care Danaija Eskridge: [name] Other Clinician: Referring Nataki Mccrumb: Treating Clayten Allcock/Extender: Vira Agar, [place] URA Weeks in Treatment: 2 Vital Signs Time Taken: 13:57 Temperature (F): 97.9 Height (in): 67 Pulse (bpm): 62 Weight (lbs): 114 Respiratory Rate (breaths/min): 16 Body Mass Index (BMI): 17.9 Blood Pressure (mmHg): 123/49 Reference Range: 80 - 120 mg / dl Electronic Signature(s) Signed: 01/03/2020 5:43:43 PM By: [name] Entered By: Karina Hurst on 01/03/2020 13:57:15

## 2020-02-14 NOTE — Progress Notes (Signed)
[name], [name] ([id number]) Visit Report for 01/31/2020 [employer] Details Patient Name: Date of Service: [name], [name] 01/31/2020 Medical Record Number: [id number] Patient Account Number: [id number] Date of Birth/Sex: Treating RN: [birth year] (86 y.o. Martyn Malay, Linda Primary Care Provider: Jan Fireman Other Clinician: Referring Provider: Treating Provider/Extender: Vira Agar, [place] URA Weeks in Treatment: 6 Diagnosis Coding ICD-10 Codes Code Description [phone] Unspecified open wound, left lower leg, initial encounter L97.822 Non-pressure chronic ulcer of other part of left lower leg with fat layer exposed E11.622 Type 2 diabetes mellitus with other skin ulcer I10 Essential (primary) hypertension Z79.01 Long term (current) use of anticoagulants Facility Procedures CPT4 Code Description Modifier Quantity [id number] - WOUND CARE VISIT-LEV 2 EST PT 1 Electronic Signature(s) Signed: 02/01/2020 12:51:17 PM By: Baruch Gouty RN, BSN Signed: 02/14/2020 12:40:59 PM By: Worthy Keeler PA-C Entered By: Baruch Gouty on 01/31/2020 12:28:38

## 2020-02-28 ENCOUNTER — Ambulatory Visit: Payer: Medicare Other | Admitting: Pulmonary Disease

## 2020-02-28 ENCOUNTER — Encounter (HOSPITAL_BASED_OUTPATIENT_CLINIC_OR_DEPARTMENT_OTHER): Payer: Medicare Other | Attending: Physician Assistant | Admitting: Physician Assistant

## 2020-02-28 ENCOUNTER — Other Ambulatory Visit: Payer: Self-pay

## 2020-02-28 DIAGNOSIS — W1789XA Other fall from one level to another, initial encounter: Secondary | ICD-10-CM | POA: Diagnosis not present

## 2020-02-28 DIAGNOSIS — I10 Essential (primary) hypertension: Secondary | ICD-10-CM | POA: Insufficient documentation

## 2020-02-28 DIAGNOSIS — S8011XA Contusion of right lower leg, initial encounter: Secondary | ICD-10-CM | POA: Insufficient documentation

## 2020-02-28 DIAGNOSIS — E11622 Type 2 diabetes mellitus with other skin ulcer: Secondary | ICD-10-CM | POA: Diagnosis not present

## 2020-02-28 DIAGNOSIS — L97822 Non-pressure chronic ulcer of other part of left lower leg with fat layer exposed: Secondary | ICD-10-CM | POA: Diagnosis not present

## 2020-02-28 DIAGNOSIS — Z7901 Long term (current) use of anticoagulants: Secondary | ICD-10-CM | POA: Insufficient documentation

## 2020-02-28 DIAGNOSIS — Y9389 Activity, other specified: Secondary | ICD-10-CM | POA: Diagnosis not present

## 2020-02-28 NOTE — Progress Notes (Addendum)
[name], [name] ([id number]) Visit Report for 02/28/2020 Chief Complaint Document Details Patient Name: Date of Service: [name], [name] 02/28/2020 3:30 PM Medical Record Number: [id number] Patient Account Number: [id number] Date of Birth/Sex: Treating RN: [birth year] (86 y.o. [name] Primary Care Provider: Jan Fireman Other Clinician: Referring Provider: Treating Provider/Extender: Vira Agar, [place] URA Weeks in Treatment: 10 Information Obtained from: Patient Chief Complaint Left LE skin tears Electronic Signature(s) Signed: 02/28/2020 3:54:01 PM By: Worthy Keeler PA-C Entered By: Worthy Keeler on 02/28/2020 15:54:00 -------------------------------------------------------------------------------- HPI Details Patient Name: Date of Service: [name], [name] 02/28/2020 3:30 PM Medical Record Number: [id number] Patient Account Number: [id number] Date of Birth/Sex: Treating RN: 1932-06-01 (86 y.o. [name] Primary Care Provider: Jan Fireman Other Clinician: Referring Provider: Treating Provider/Extender: Vira Agar, [place] URA Weeks in Treatment: 10 History of Present Illness HPI Description: 12/20/2019 patient presents today for initial evaluation here in our clinic concerning issues that she unfortunately has been having with skin tears over the right lower extremity. This happened secondary to a fall that she had initially in December causing some skin tears that for the most part healed and then she sustained a second fall in February 2021. Subsequently the patient did have skin tears which they have been trying to manage at this point and home health has not really been put anything on it as they are waiting for orders from [place]. As far as the daughter is concerned with the patient she has been using Vaseline over the area at this point to try to keep the area moist and nothing would stick to it. The patient is having discomfort  unfortunately. Nonetheless I do not see any signs of active infection which is great news. She does have a history of diabetes mellitus type 2, hypertension, and is on anticoagulant therapy due to history of stroke. 12/27/2019 upon evaluation today patient appears to be doing well with regard to her wounds. They do appear to be somewhat dry however. She complains of the Xeroform having been sticking to the wound bed. Again not exactly sure why this would be the case at this point. Nonetheless there is no signs of infection I believe she may actually benefit from [place] currently. 01/03/20 upon evaluation today patient actually appears to be doing well with regard to her wounds at this time. I see evidence of improvement at both locations and fortunately there is no evidence of active infection at this time. No fevers, chills, nausea, vomiting, or diarrhea. 01/17/2020 upon evaluation today patient actually appears to be doing excellent in regard to her leg ulcer. This is actually healing quite nicely and overall I am very pleased with the progress that is been made. There is no evidence of active infection at this time. 02/28/2020 upon evaluation today patient actually appears to be doing quite well with regard to her wounds. She shows no signs of actually had anything open on the elbow and her legs are still doing well. Fortunately I am very pleased with the overall appearance today. Electronic Signature(s) Signed: 02/28/2020 6:25:07 PM By: Worthy Keeler PA-C Entered By: Worthy Keeler on 02/28/2020 18:25:07 -------------------------------------------------------------------------------- Physical Exam Details Patient Name: Date of Service: [name], [name] 02/28/2020 3:30 PM Medical Record Number: [id number] Patient Account Number: [id number] Date of Birth/Sex: Treating RN: [birth year] (86 y.o. [name] Primary Care Provider: Jan Fireman Other Clinician: Referring Provider: Treating  Provider/Extender: Vira Agar, [place] URA Weeks in Treatment: 10 Constitutional  Well-nourished and well-hydrated in no acute distress. Respiratory normal breathing without difficulty. Psychiatric this patient is able to make decisions and demonstrates good insight into disease process. Alert and Oriented x 3. pleasant and cooperative. Notes Patient's wound bed currently showed signs of excellent granulation at this time and overall I feel like the patient has done extremely well as far as healing is concerned. I did not realize that this was actually a new wound that we had not seen until evaluation today but again there does not appear to be a open wound at this time it appears she had a skin tear which healed with that skin pulled underneath on its own and again though I think it could potentially get snagged and reopen right now there is really nothing that I would trim off at this point. Electronic Signature(s) Signed: 02/28/2020 6:25:39 PM By: Worthy Keeler PA-C Entered By: Worthy Keeler on 02/28/2020 18:25:39 -------------------------------------------------------------------------------- Physician Orders Details Patient Name: Date of Service: [name], [name] 02/28/2020 3:30 PM Medical Record Number: [id number] Patient Account Number: [id number] Date of Birth/Sex: Treating RN: 07/02/1932 (86 y.o. Martyn Malay, Linda Primary Care Provider: Jan Fireman Other Clinician: Referring Provider: Treating Provider/Extender: Vira Agar, [place] URA Weeks in Treatment: 10 Verbal / Phone Orders: No Diagnosis Coding ICD-10 Coding Code Description [phone] Unspecified open wound, left lower leg, initial encounter L97.822 Non-pressure chronic ulcer of other part of left lower leg with fat layer exposed E11.622 Type 2 diabetes mellitus with other skin ulcer I10 Essential (primary) hypertension Z79.01 Long term (current) use of anticoagulants Discharge From [facility] Services Discharge  from [place] lotion - to skin daily Additional Orders / Instructions Other: - long sleeves to protect fragile skin on arms Electronic Signature(s) Signed: 02/28/2020 6:08:32 PM By: Baruch Gouty RN, BSN Signed: 02/28/2020 6:29:31 PM By: Worthy Keeler PA-C Entered By: Baruch Gouty on 02/28/2020 17:05:09 -------------------------------------------------------------------------------- Problem List Details Patient Name: Date of Service: [name], [name] 02/28/2020 3:30 PM Medical Record Number: [id number] Patient Account Number: [id number] Date of Birth/Sex: Treating RN: 1932/02/13 (86 y.o. [name] Primary Care Provider: Jan Fireman Other Clinician: Referring Provider: Treating Provider/Extender: Vira Agar, [place] URA Weeks in Treatment: 10 Active Problems ICD-10 Encounter Code Description Active Date MDM Diagnosis S81.802A Unspecified open wound, left lower leg, initial encounter 12/20/2019 No Yes L97.822 Non-pressure chronic ulcer of other part of left lower leg with fat layer exposed3/31/2021 No Yes E11.622 Type 2 diabetes mellitus with other skin ulcer 12/20/2019 No Yes I10 Essential (primary) hypertension 12/20/2019 No Yes Z79.01 Long term (current) use of anticoagulants 12/20/2019 No Yes Inactive Problems Resolved Problems Electronic Signature(s) Signed: 02/28/2020 3:53:55 PM By: Worthy Keeler PA-C Entered By: Worthy Keeler on 02/28/2020 15:53:55 -------------------------------------------------------------------------------- Progress Note Details Patient Name: Date of Service: [name], [name] 02/28/2020 3:30 PM Medical Record Number: [id number] Patient Account Number: [id number] Date of Birth/Sex: Treating RN: 10-12-1931 (86 y.o. [name] Primary Care Provider: Jan Fireman Other Clinician: Referring Provider: Treating Provider/Extender: Vira Agar, LA URA Weeks in Treatment:  10 Subjective Chief Complaint Information obtained from Patient Left LE skin tears History of Present Illness (HPI) 12/20/2019 patient presents today for initial evaluation here in our clinic concerning issues that she unfortunately has been having with skin tears over the right lower extremity. This happened secondary to a fall that she had initially in December causing some skin tears that for the most part healed and then she  sustained a second fall in February 2021. Subsequently the patient did have skin tears which they have been trying to manage at this point and home health has not really been put anything on it as they are waiting for orders from [place]. As far as the daughter is concerned with the patient she has been using Vaseline over the area at this point to try to keep the area moist and nothing would stick to it. The patient is having discomfort unfortunately. Nonetheless I do not see any signs of active infection which is great news. She does have a history of diabetes mellitus type 2, hypertension, and is on anticoagulant therapy due to history of stroke. 12/27/2019 upon evaluation today patient appears to be doing well with regard to her wounds. They do appear to be somewhat dry however. She complains of the Xeroform having been sticking to the wound bed. Again not exactly sure why this would be the case at this point. Nonetheless there is no signs of infection I believe she may actually benefit from [place] currently. 01/03/20 upon evaluation today patient actually appears to be doing well with regard to her wounds at this time. I see evidence of improvement at both locations and fortunately there is no evidence of active infection at this time. No fevers, chills, nausea, vomiting, or diarrhea. 01/17/2020 upon evaluation today patient actually appears to be doing excellent in regard to her leg ulcer. This is actually healing quite nicely and overall I am very pleased with the progress  that is been made. There is no evidence of active infection at this time. 02/28/2020 upon evaluation today patient actually appears to be doing quite well with regard to her wounds. She shows no signs of actually had anything open on the elbow and her legs are still doing well. Fortunately I am very pleased with the overall appearance today. Objective Constitutional Well-nourished and well-hydrated in no acute distress. Vitals Time Taken: 4:05 PM, Height: 67 in, Weight: 114 lbs, BMI: 17.9, Temperature: 97.8 F, Pulse: 56 bpm, Respiratory Rate: 20 breaths/min, Blood Pressure: 148/84 mmHg. Respiratory normal breathing without difficulty. Psychiatric this patient is able to make decisions and demonstrates good insight into disease process. Alert and Oriented x 3. pleasant and cooperative. General Notes: Patient's wound bed currently showed signs of excellent granulation at this time and overall I feel like the patient has done extremely well as far as healing is concerned. I did not realize that this was actually a new wound that we had not seen until evaluation today but again there does not appear to be a open wound at this time it appears she had a skin tear which healed with that skin pulled underneath on its own and again though I think it could potentially get snagged and reopen right now there is really nothing that I would trim off at this point. Assessment Active Problems ICD-10 Unspecified open wound, left lower leg, initial encounter Non-pressure chronic ulcer of other part of left lower leg with fat layer exposed Type 2 diabetes mellitus with other skin ulcer Essential (primary) hypertension Long term (current) use of anticoagulants Plan Discharge From [facility] Services: Discharge from [place] Skin Barriers/Peri-Wound Care: Moisturizing lotion - to skin daily Additional Orders / Instructions: Other: - long sleeves to protect fragile skin on arms 1. I would recommend  currently that we just continue to monitor the situation again I do not see anything that looks open and in fact I think the biggest issue here  is going to be if he gets snagged and pulled that could cause the area to potentially reopen. For that reason she should protect this. 2. With regard to if they wanted to try to see about doing something to flatten this out my suggestion would be that we need to have her see plastic surgery for such an event though they really do not seem to voice any desire to do that at this point. We will see the patient back for follow-up visit as needed. Electronic Signature(s) Signed: 02/28/2020 6:26:27 PM By: Worthy Keeler PA-C Entered By: Worthy Keeler on 02/28/2020 18:26:27 -------------------------------------------------------------------------------- SuperBill Details Patient Name: Date of Service: [name], [name] 02/28/2020 Medical Record Number: [id number] Patient Account Number: [id number] Date of Birth/Sex: Treating RN: 03/26/1932 (86 y.o. [name] Primary Care Provider: Jan Fireman Other Clinician: Referring Provider: Treating Provider/Extender: Vira Agar, LA URA Weeks in Treatment: 10 Diagnosis Coding ICD-10 Codes Code Description S81.802A Unspecified open wound, left lower leg, initial encounter L97.822 Non-pressure chronic ulcer of other part of left lower leg with fat layer exposed E11.622 Type 2 diabetes mellitus with other skin ulcer I10 Essential (primary) hypertension Z79.01 Long term (current) use of anticoagulants Facility Procedures CPT4 Code: [id number] Description: [phone] - WOUND CARE VISIT-LEV 2 EST PT Modifier: Quantity: 1 Physician Procedures : CPT4 Code Description Modifier [id number] - WC PHYS LEVEL 3 - EST PT ICD-10 Diagnosis Description S81.802A Unspecified open wound, left lower leg, initial encounter L97.822 Non-pressure chronic ulcer of other part of left lower leg with fat layer  exposed  E11.622 Type 2 diabetes mellitus with other skin ulcer I10 Essential (primary) hypertension Quantity: 1 Electronic Signature(s) Signed: 02/28/2020 6:26:40 PM By: Worthy Keeler PA-C Previous Signature: 02/28/2020 6:08:32 PM Version By: Baruch Gouty RN, BSN Entered By: Worthy Keeler on 02/28/2020 18:26:40

## 2020-02-28 NOTE — Progress Notes (Signed)
[name], [name] ([id number]) Visit Report for 02/28/2020 Arrival Information Details Patient Name: Date of Service: [name], [name] 02/28/2020 3:30 PM Medical Record Number: [id number] Patient Account Number: [id number] Date of Birth/Sex: Treating RN: 1932/07/17 (86 y.o. [name] Primary Care [name]: [name] Other Clinician: Referring Birdie Fetty: Treating [name]/Extender: Vira Agar, [place] URA Weeks in Treatment: 10 Visit Information History Since Last Visit Added or deleted any medications: No Patient Arrived: [place] Any new allergies or adverse reactions: No Arrival Time: 16:06 Had a fall or experienced change in No Accompanied By: family member activities of daily living that may affect Transfer Assistance: None risk of falls: Patient Identification Verified: Yes Signs or symptoms of abuse/neglect since last visito No Secondary Verification Process Completed: Yes Hospitalized since last visit: No Patient Requires Transmission-Based Precautions: No Implantable device outside of the clinic excluding No Patient Has Alerts: Yes cellular tissue based products placed in the center Patient Alerts: Patient on Blood Thinner since last visit: Pain Present Now: No Electronic Signature(s) Signed: 02/28/2020 5:57:14 PM By: Deon Pilling Entered By: Deon Pilling on 02/28/2020 16:06:33 -------------------------------------------------------------------------------- Clinic Level of Care Assessment Details Patient Name: Date of Service: [name], [name] 02/28/2020 3:30 PM Medical Record Number: [id number] Patient Account Number: [id number] Date of Birth/Sex: Treating RN: Dec 04, 1931 (86 y.o. [name] Primary Care Khadar Monger: [name] Other Clinician: Referring [name]: Treating [name]/Extender: Vira Agar, [place] URA Weeks in Treatment: 10 Clinic Level of Care Assessment Items TOOL 4 Quantity Score []  - 0 Use when only an EandM is performed on  FOLLOW-UP visit ASSESSMENTS - Nursing Assessment / Reassessment X- 1 10 Reassessment of Co-morbidities (includes updates in patient status) X- 1 5 Reassessment of Adherence to Treatment Plan ASSESSMENTS - Wound and Skin A ssessment / Reassessment []  - 0 Simple Wound Assessment / Reassessment - one wound []  - 0 Complex Wound Assessment / Reassessment - multiple wounds X- 1 10 Dermatologic / Skin Assessment (not related to wound area) ASSESSMENTS - Focused Assessment []  - 0 Circumferential Edema Measurements - multi extremities []  - 0 Nutritional Assessment / Counseling / Intervention []  - 0 Lower Extremity Assessment (monofilament, tuning fork, pulses) []  - 0 Peripheral Arterial Disease Assessment (using hand held doppler) ASSESSMENTS - Ostomy and/or Continence Assessment and Care []  - 0 Incontinence Assessment and Management []  - 0 Ostomy Care Assessment and Management (repouching, etc.) PROCESS - Coordination of Care X - Simple Patient / Family Education for ongoing care 1 15 []  - 0 Complex (extensive) Patient / Family Education for ongoing care X- 1 10 Staff obtains Programmer, systems, Records, T Results / Process Orders est []  - 0 Staff telephones HHA, Nursing Homes / Clarify orders / etc []  - 0 Routine Transfer to another Facility (non-emergent condition) []  - 0 Routine Hospital Admission (non-emergent condition) []  - 0 New Admissions / Biomedical engineer / Ordering NPWT Apligraf, etc. , []  - 0 Emergency Hospital Admission (emergent condition) X- 1 10 Simple Discharge Coordination []  - 0 Complex (extensive) Discharge Coordination PROCESS - Special Needs []  - 0 Pediatric / Minor Patient Management []  - 0 Isolation Patient Management []  - 0 Hearing / Language / Visual special needs []  - 0 Assessment of Community assistance (transportation, D/C planning, etc.) []  - 0 Additional assistance / Altered mentation []  - 0 Support Surface(s) Assessment (bed, cushion,  seat, etc.) INTERVENTIONS - Wound Cleansing / Measurement []  - 0 Simple Wound Cleansing - one wound []  - 0 Complex Wound Cleansing - multiple wounds []  - 0  Wound Imaging (photographs - any number of wounds) []  - 0 Wound Tracing (instead of photographs) []  - 0 Simple Wound Measurement - one wound []  - 0 Complex Wound Measurement - multiple wounds INTERVENTIONS - Wound Dressings []  - 0 Small Wound Dressing one or multiple wounds []  - 0 Medium Wound Dressing one or multiple wounds []  - 0 Large Wound Dressing one or multiple wounds []  - 0 Application of Medications - topical []  - 0 Application of Medications - injection INTERVENTIONS - Miscellaneous []  - 0 External ear exam []  - 0 Specimen Collection (cultures, biopsies, blood, body fluids, etc.) []  - 0 Specimen(s) / Culture(s) sent or taken to Lab for analysis []  - 0 Patient Transfer (multiple staff / Civil Service fast streamer / Similar devices) []  - 0 Simple Staple / Suture removal (25 or less) []  - 0 Complex Staple / Suture removal (26 or more) []  - 0 Hypo / Hyperglycemic Management (close monitor of Blood Glucose) []  - 0 Ankle / Brachial Index (ABI) - do not check if billed separately X- 1 5 Vital Signs Has the patient been seen at the hospital within the last three years: Yes Total Score: 65 Level Of Care: New/Established - Level 2 Electronic Signature(s) Signed: 02/28/2020 6:08:32 PM By: Baruch Gouty RN, BSN Entered By: Baruch Gouty on 02/28/2020 17:36:20 -------------------------------------------------------------------------------- Encounter Discharge Information Details Patient Name: Date of Service: [name], [name] 02/28/2020 3:30 PM Medical Record Number: [id number] Patient Account Number: [id number] Date of Birth/Sex: Treating RN: 1931/10/27 (86 y.o. [name] Primary Care Jasson Siegmann: [name] Other Clinician: Referring Ayaat Jansma: Treating Ronnie Doo/Extender: Vira Agar, [place] URA Weeks in  Treatment: 10 Encounter Discharge Information Items Discharge Condition: Stable Ambulatory Status: Ambulatory Discharge Destination: Home Transportation: Private Auto Accompanied By: Charolett Bumpers Schedule Follow-up Appointment: Yes Clinical Summary of Care: Patient Declined Electronic Signature(s) Signed: 02/28/2020 6:08:32 PM By: Baruch Gouty RN, BSN Entered By: Baruch Gouty on 02/28/2020 17:06:46 -------------------------------------------------------------------------------- Lower Extremity Assessment Details Patient Name: Date of Service: [name], [name] 02/28/2020 3:30 PM Medical Record Number: [id number] Patient Account Number: [id number] Date of Birth/Sex: Treating RN: 06-10-1932 (86 y.o. [name] Primary Care Kanijah Groseclose: [name] Other Clinician: Referring Marquee Fuchs: Treating Caeson Filippi/Extender: Vira Agar, [place] URA Weeks in Treatment: 10 Electronic Signature(s) Signed: 02/28/2020 5:57:14 PM By: Deon Pilling Entered By: Deon Pilling on 02/28/2020 16:11:36 -------------------------------------------------------------------------------- Pain Assessment Details Patient Name: Date of Service: [name], [name] 02/28/2020 3:30 PM Medical Record Number: [id number] Patient Account Number: [id number] Date of Birth/Sex: Treating RN: [birth year] (86 y.o. [name] Primary Care Frankey Botting: Other Clinician: Jan Pitts Referring Malyn Aytes: Treating Vernee Baines/Extender: Vira Agar, [place] URA Weeks in Treatment: 10 Active Problems Location of Pain Severity and Description of Pain Patient Has Paino No Site Locations Rate the pain. Current Pain Level: 0 Pain Management and Medication Current Pain Management: Medication: No Cold Application: No Rest: No Massage: No Activity: No T.E.N.S.: No Heat Application: No Leg drop or elevation: No Is the Current Pain Management Adequate: Adequate How does your wound impact your activities of daily livingo Sleep:  No Bathing: No Appetite: No Relationship With Others: No Bladder Continence: No Emotions: No Bowel Continence: No Work: No Toileting: No Drive: No Dressing: No Hobbies: No Electronic Signature(s) Signed: 02/28/2020 5:57:14 PM By: Deon Pilling Entered By: Deon Pilling on 02/28/2020 16:11:31 -------------------------------------------------------------------------------- Patient/Caregiver Education Details Patient Name: Date of Service: [name] 6/9/2021andnbsp3:30 PM Medical Record Number: [id number] Patient Account Number: [id number] Date of Birth/Gender: Treating RN: 12-27-1931 (86 y.o. F)  Baruch Gouty Primary Care Physician: [name] Other Clinician: Referring Physician: Treating Physician/Extender: Vira Agar, [place] URA Weeks in Treatment: 10 Education Assessment Education Provided To: Patient Education Topics Provided Wound/Skin Impairment: Methods: Explain/Verbal Responses: Reinforcements needed, State content correctly Electronic Signature(s) Signed: 02/28/2020 6:08:32 PM By: Baruch Gouty RN, BSN Entered By: Baruch Gouty on 02/28/2020 17:35:49 -------------------------------------------------------------------------------- [place] Details Patient Name: Date of Service: [name], [name] 02/28/2020 3:30 PM Medical Record Number: [id number] Patient Account Number: [id number] Date of Birth/Sex: Treating RN: [birth year] (86 y.o. Helene Shoe, Meta.Reding Primary Care Cleotha Tsang: [name] Other Clinician: Referring Keerat Denicola: Treating Catarina Huntley/Extender: Vira Agar, [place] URA Weeks in Treatment: 10 Vital Signs Time Taken: 16:05 Temperature (F): 97.8 Height (in): 67 Pulse (bpm): 56 Weight (lbs): 114 Respiratory Rate (breaths/min): 20 Body Mass Index (BMI): 17.9 Blood Pressure (mmHg): 148/84 Reference Range: 80 - 120 mg / dl Electronic Signature(s) Signed: 02/28/2020 5:57:14 PM By: Deon Pilling Entered By: Deon Pilling on 02/28/2020  16:10:32

## 2020-03-05 ENCOUNTER — Other Ambulatory Visit: Payer: Self-pay

## 2020-03-05 ENCOUNTER — Ambulatory Visit (INDEPENDENT_AMBULATORY_CARE_PROVIDER_SITE_OTHER): Payer: Medicare Other | Admitting: Internal Medicine

## 2020-03-05 DIAGNOSIS — Z5329 Procedure and treatment not carried out because of patient's decision for other reasons: Secondary | ICD-10-CM

## 2020-03-05 DIAGNOSIS — J90 Pleural effusion, not elsewhere classified: Secondary | ICD-10-CM

## 2020-03-05 NOTE — Progress Notes (Signed)
03/05/20   No show

## 2020-03-06 ENCOUNTER — Ambulatory Visit (INDEPENDENT_AMBULATORY_CARE_PROVIDER_SITE_OTHER): Payer: Medicare Other

## 2020-03-06 DIAGNOSIS — J90 Pleural effusion, not elsewhere classified: Secondary | ICD-10-CM

## 2020-03-06 IMAGING — DX DG CHEST 2V
2 series · 2 of 2 positions shown · non-contrast
Comparison: [DATE]

CLINICAL DATA: Follow-up pleural effusion

EXAM:
CHEST - 2 VIEW

[chest pa]
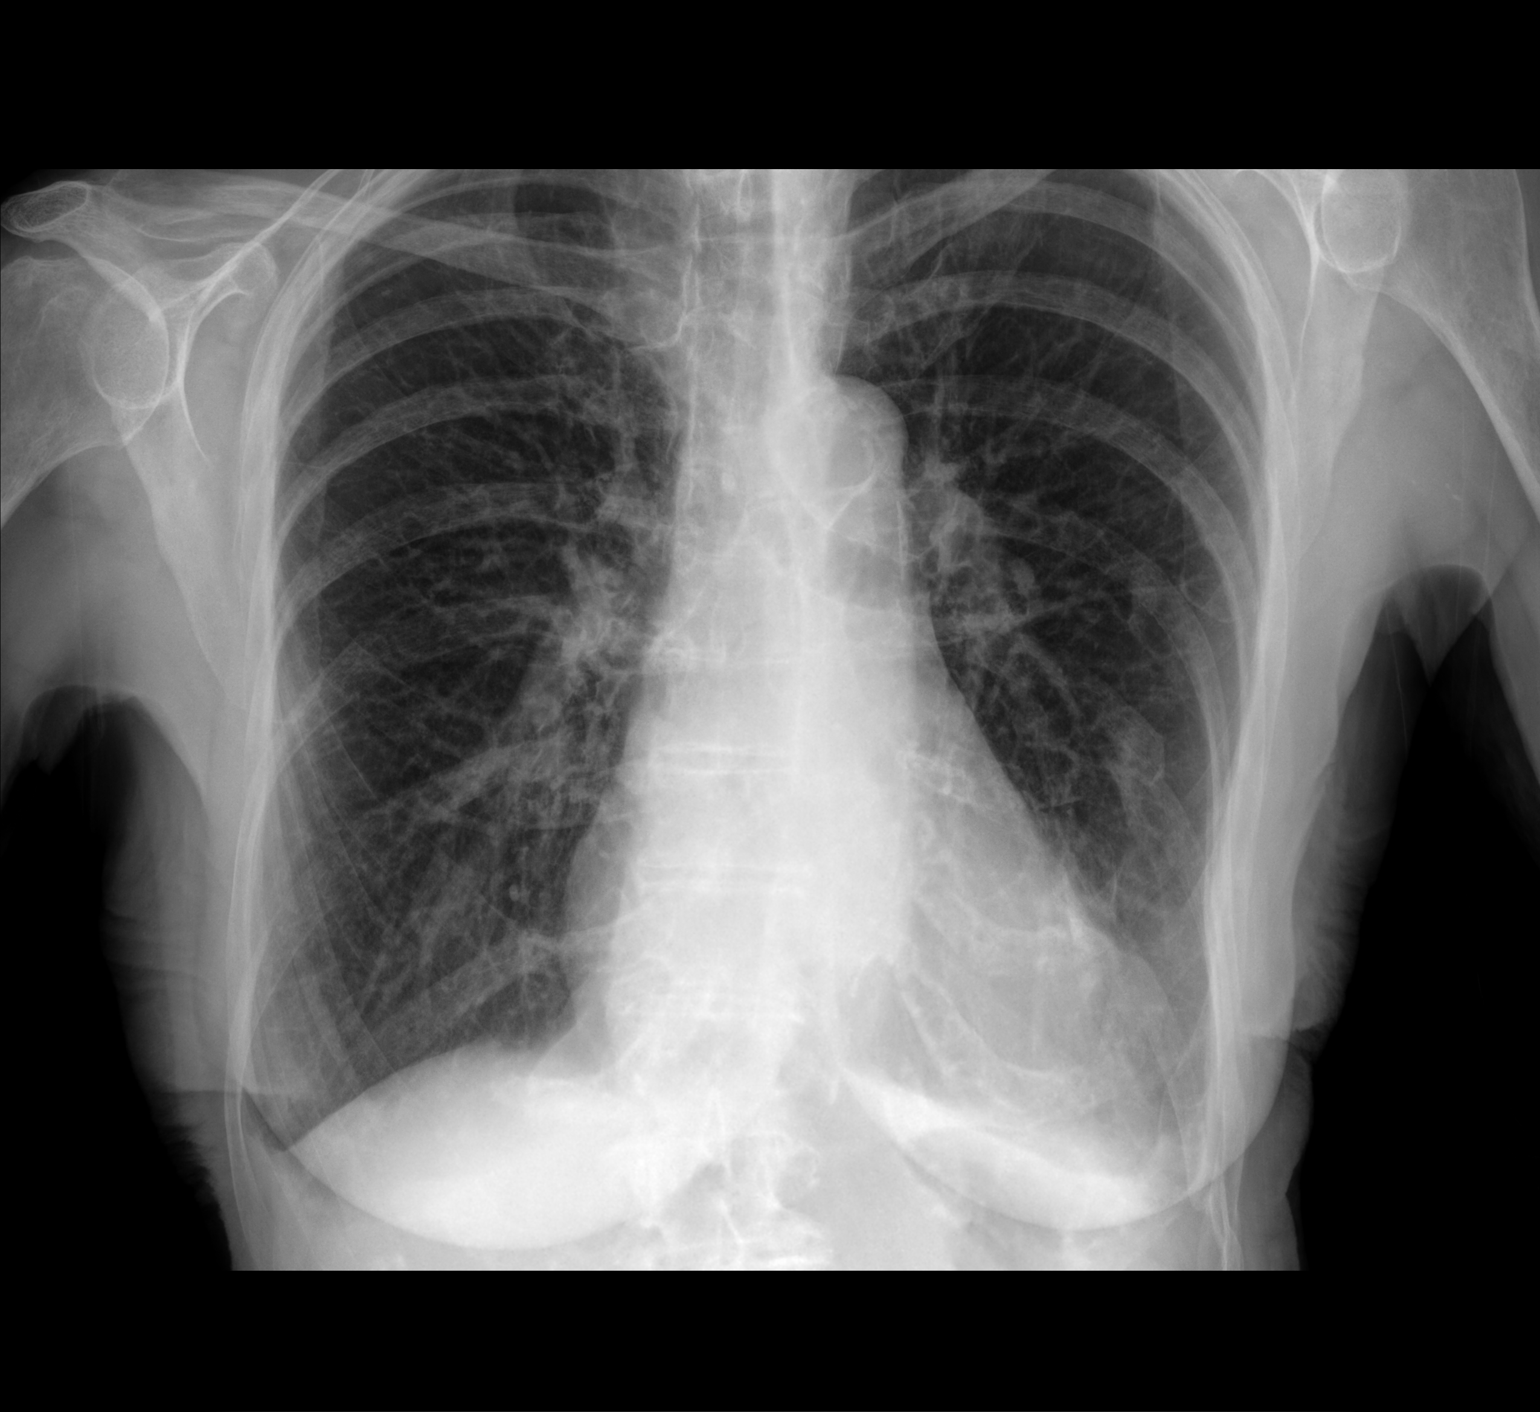

[chest lat]
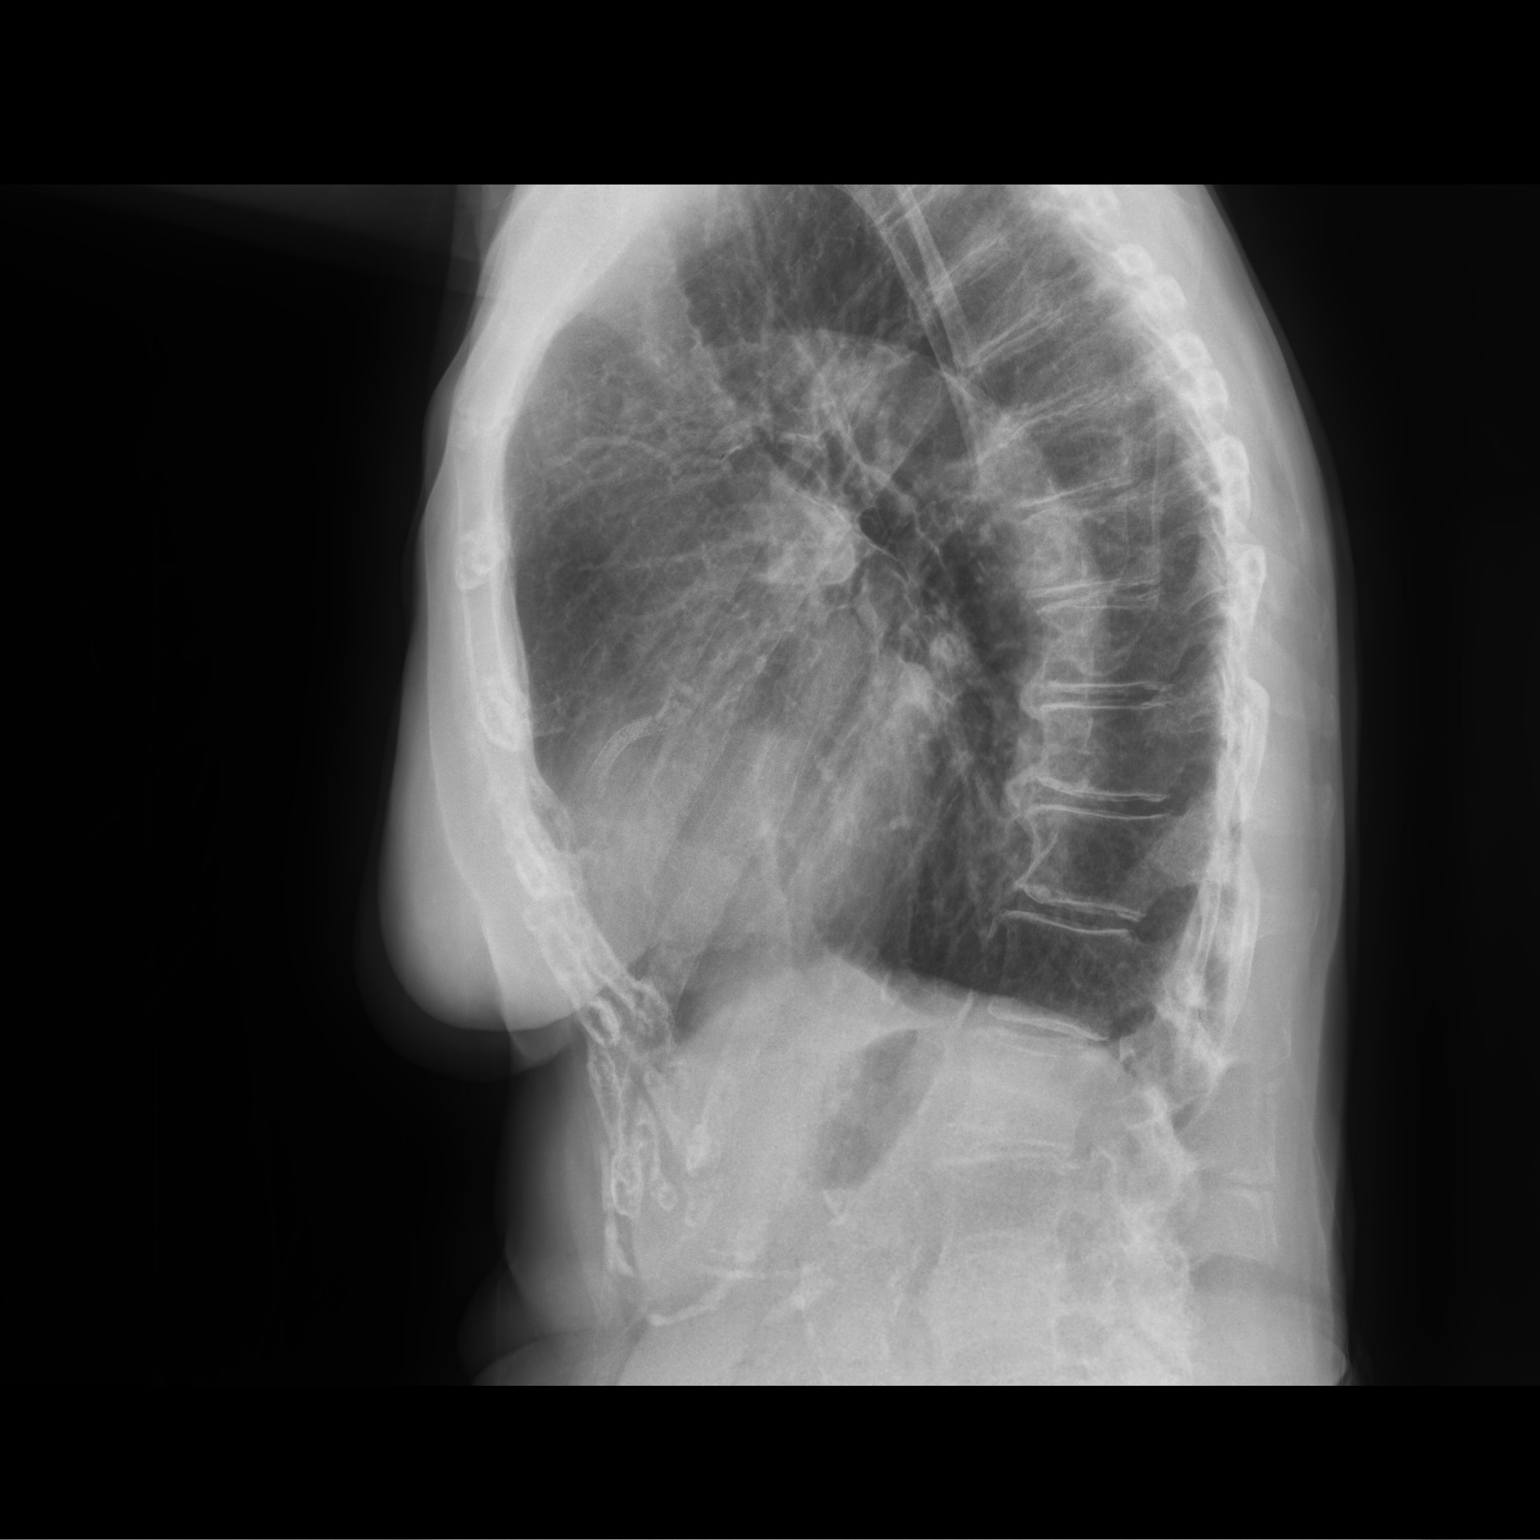

[2 of 2 positions shown; findings below may reference images not displayed]

FINDINGS: Cardiac shadow is stable. Aortic calcifications are again seen. The
lungs are well aerated bilaterally. Minimal chronic blunting of
costophrenic angles is seen. Previously noted PleurX catheter been
removed. No recurrent pleural effusion is seen. No acute bony
abnormality is noted.
IMPRESSION: Interval removal of PleurX catheter. No significant recurrent
pleural effusion is seen.

## 2020-03-08 ENCOUNTER — Encounter (HOSPITAL_BASED_OUTPATIENT_CLINIC_OR_DEPARTMENT_OTHER): Payer: Medicare Other | Admitting: Internal Medicine

## 2020-03-08 ENCOUNTER — Other Ambulatory Visit: Payer: Self-pay

## 2020-03-08 DIAGNOSIS — E11622 Type 2 diabetes mellitus with other skin ulcer: Secondary | ICD-10-CM | POA: Diagnosis not present

## 2020-03-08 NOTE — Progress Notes (Signed)
Pitts, Karina ([id number]) Visit Report for 03/08/2020 HPI Details Patient Name: Date of Service: [name], [name] 03/08/2020 7:30 A M Medical Record Number: [id number] Patient Account Number: [id number] Date of Birth/Sex: Treating RN: 12/19/1931 (86 y.o. Clearnce Sorrel Primary Care Provider: Jan Fireman Other Clinician: Referring Provider: Treating Provider/Extender: Edythe Lynn, Bo Merino Weeks in Treatment: 11 History of Present Illness HPI Description: 12/20/2019 patient presents today for initial evaluation here in our clinic concerning issues that she unfortunately has been having with skin tears over the right lower extremity. This happened secondary to a fall that she had initially in December causing some skin tears that for the most part healed and then she sustained a second fall in February 2021. Subsequently the patient did have skin tears which they have been trying to manage at this point and home health has not really been put anything on it as they are waiting for orders from [place]. As far as the daughter is concerned with the patient she has been using Vaseline over the area at this point to try to keep the area moist and nothing would stick to it. The patient is having discomfort unfortunately. Nonetheless I do not see any signs of active infection which is great news. She does have a history of diabetes mellitus type 2, hypertension, and is on anticoagulant therapy due to history of stroke. 12/27/2019 upon evaluation today patient appears to be doing well with regard to her wounds. They do appear to be somewhat dry however. She complains of the Xeroform having been sticking to the wound bed. Again not exactly sure why this would be the case at this point. Nonetheless there is no signs of infection I believe she may actually benefit from [place] currently. 01/03/20 upon evaluation today patient actually appears to be doing well with regard to her wounds at this time. I  see evidence of improvement at both locations and fortunately there is no evidence of active infection at this time. No fevers, chills, nausea, vomiting, or diarrhea. 01/17/2020 upon evaluation today patient actually appears to be doing excellent in regard to her leg ulcer. This is actually healing quite nicely and overall I am very pleased with the progress that is been made. There is no evidence of active infection at this time. 02/28/2020 upon evaluation today patient actually appears to be doing quite well with regard to her wounds. She shows no signs of actually had anything open on the elbow and her legs are still doing well. Fortunately I am very pleased with the overall appearance today. 03/08/2020; this is a patient I have not seen previously although she has been in the clinic twice before including recently on 6/9. She has chronic hemosiderin deposition likely related to venous insufficiency. Her daughter thinks that she was worked up for this when she lived in [place]. She does not use compression stockings or support stockings although she has them. When she was here 9 days ago she did not have anything open. Unfortunately she had a fall while stepping up from her porch into the house. She suffered skin tears quite extensively on the lateral right upper leg and on the left medial lower leg proximally and distally. These are all extensively Steri-Stripped. Electronic Signature(s) Signed: 03/08/2020 5:24:49 PM By: Linton Ham MD Entered By: Linton Ham on 03/08/2020 08:24:49 -------------------------------------------------------------------------------- Physical Exam Details Patient Name: Date of Service: Karina, Pitts 03/08/2020 7:30 A M Medical Record Number: [id number] Patient Account Number: [id number] Date of Birth/Sex: Treating RN:  [birth year] (86 y.o. Clearnce Sorrel Primary Care Provider: Jan Fireman Other Clinician: Referring Provider: Treating Provider/Extender:  Edythe Lynn, [place] URA Weeks in Treatment: 11 Constitutional Sitting or standing Blood Pressure is within target range for patient.. Pulse regular and within target range for patient.Marland Kitchen Respirations regular, non-labored and within target range.. Temperature is normal and within the target range for the patient.Marland Kitchen Appears in no distress. Cardiovascular Pedal pulses are palpable bilaterally. She does not have anything in the way of edema however she has extensive hemosiderin staining bilaterally in her anterior lower extremity with very fragile skin. Integumentary (Hair, Skin) Hemosiderin's staining that looks like chronic venous insufficiency in the bilateral lower legs. Psychiatric Some degree of cognitive impairment. Notes Wound exam; Upper right lateral calf this is extensively Steri-Stripped. There is very clearly still open wounds under the Steri-Strips. Also worrisome inferiorly is a large area of dark skin which may or may not remain viable I think this was probably folded back into place after the fall Left medial lower leg in 2 spots. These are also Steri-Stripped. No evidence of infection in either area Electronic Signature(s) Signed: 03/08/2020 5:24:49 PM By: Linton Ham MD Entered By: Linton Ham on 03/08/2020 08:26:45 -------------------------------------------------------------------------------- Physician Orders Details Patient Name: Date of Service: FLORIDE, HUTMACHER 03/08/2020 7:30 A M Medical Record Number: [id number] Patient Account Number: [id number] Date of Birth/Sex: Treating RN: Feb 01, 1932 (86 y.o. F) Dwiggins, Shannon Primary Care Provider: Jan Fireman Other Clinician: Referring Provider: Treating Provider/Extender: Edythe Lynn, Bo Merino Weeks in Treatment: 11 Verbal / Phone Orders: No Diagnosis Coding ICD-10 Coding Code Description S81.802A Unspecified open wound, left lower leg, initial encounter L97.822 Non-pressure chronic ulcer of  other part of left lower leg with fat layer exposed E11.622 Type 2 diabetes mellitus with other skin ulcer I10 Essential (primary) hypertension Z79.01 Long term (current) use of anticoagulants Follow-up Appointments ppointment in 1 week. - friday Return A Dressing Change Frequency Do not change entire dressing for one week. - keep steri-strips intact and do not get wet Primary Wound Dressing Other: - steri-strips to remain intact Electronic Signature(s) Signed: 03/08/2020 5:07:15 PM By: Kela Millin Signed: 03/08/2020 5:24:49 PM By: Linton Ham MD Entered By: Kela Millin on 03/08/2020 08:16:44 -------------------------------------------------------------------------------- Problem List Details Patient Name: Date of Service: DONNI, OGLESBY 03/08/2020 7:30 A M Medical Record Number: [id number] Patient Account Number: [id number] Date of Birth/Sex: Treating RN: 1932/04/02 (86 y.o. Clearnce Sorrel Primary Care Provider: Jan Fireman Other Clinician: Referring Provider: Treating Provider/Extender: Edythe Lynn, Bo Merino Weeks in Treatment: 11 Active Problems ICD-10 Encounter Code Description Active Date MDM Diagnosis L97.818 Non-pressure chronic ulcer of other part of right lower leg with other specified 03/08/2020 No Yes severity L97.828 Non-pressure chronic ulcer of other part of left lower leg with other specified 03/08/2020 No Yes severity E11.622 Type 2 diabetes mellitus with other skin ulcer 12/20/2019 No Yes Z79.01 Long term (current) use of anticoagulants 12/20/2019 No Yes S80.11XD Contusion of right lower leg, subsequent encounter 03/08/2020 No Yes Inactive Problems ICD-10 Code Description Active Date Inactive Date L97.822 Non-pressure chronic ulcer of other part of left lower leg with fat layer exposed 12/20/2019 12/20/2019 Resolved Problems ICD-10 Code Description Active Date Resolved Date S81.802A Unspecified open wound, left lower leg, initial  encounter 12/20/2019 12/20/2019 Electronic Signature(s) Signed: 03/08/2020 5:24:49 PM By: Linton Ham MD Entered By: Linton Ham on 03/08/2020 08:23:13 -------------------------------------------------------------------------------- Progress Note Details Patient Name: Date of Service: SHARLET, NOTARO 03/08/2020 7:30 A M Medical Record Number: [id number]  Patient Account Number: [id number] Date of Birth/Sex: Treating RN: 30-Apr-1932 (86 y.o. Clearnce Sorrel Primary Care Provider: Jan Fireman Other Clinician: Referring Provider: Treating Provider/Extender: Edythe Lynn, [place] URA Weeks in Treatment: 11 Subjective History of Present Illness (HPI) 12/20/2019 patient presents today for initial evaluation here in our clinic concerning issues that she unfortunately has been having with skin tears over the right lower extremity. This happened secondary to a fall that she had initially in December causing some skin tears that for the most part healed and then she sustained a second fall in February 2021. Subsequently the patient did have skin tears which they have been trying to manage at this point and home health has not really been put anything on it as they are waiting for orders from [place]. As far as the daughter is concerned with the patient she has been using Vaseline over the area at this point to try to keep the area moist and nothing would stick to it. The patient is having discomfort unfortunately. Nonetheless I do not see any signs of active infection which is great news. She does have a history of diabetes mellitus type 2, hypertension, and is on anticoagulant therapy due to history of stroke. 12/27/2019 upon evaluation today patient appears to be doing well with regard to her wounds. They do appear to be somewhat dry however. She complains of the Xeroform having been sticking to the wound bed. Again not exactly sure why this would be the case at this point. Nonetheless there is  no signs of infection I believe she may actually benefit from [place] currently. 01/03/20 upon evaluation today patient actually appears to be doing well with regard to her wounds at this time. I see evidence of improvement at both locations and fortunately there is no evidence of active infection at this time. No fevers, chills, nausea, vomiting, or diarrhea. 01/17/2020 upon evaluation today patient actually appears to be doing excellent in regard to her leg ulcer. This is actually healing quite nicely and overall I am very pleased with the progress that is been made. There is no evidence of active infection at this time. 02/28/2020 upon evaluation today patient actually appears to be doing quite well with regard to her wounds. She shows no signs of actually had anything open on the elbow and her legs are still doing well. Fortunately I am very pleased with the overall appearance today. 03/08/2020; this is a patient I have not seen previously although she has been in the clinic twice before including recently on 6/9. She has chronic hemosiderin deposition likely related to venous insufficiency. Her daughter thinks that she was worked up for this when she lived in [place]. She does not use compression stockings or support stockings although she has them. When she was here 9 days ago she did not have anything open. Unfortunately she had a fall while stepping up from her porch into the house. She suffered skin tears quite extensively on the lateral right upper leg and on the left medial lower leg proximally and distally. These are all extensively Steri-Stripped. Objective Constitutional Sitting or standing Blood Pressure is within target range for patient.. Pulse regular and within target range for patient.Marland Kitchen Respirations regular, non-labored and within target range.. Temperature is normal and within the target range for the patient.Marland Kitchen Appears in no distress. Vitals Time Taken: 7:48 AM, Height: 67 in,  Weight: 114 lbs, BMI: 17.9, Temperature: 98.8 F, Pulse: 84 bpm, Respiratory Rate: 18 breaths/min, Blood Pressure:  117/67 mmHg. Cardiovascular Pedal pulses are palpable bilaterally. She does not have anything in the way of edema however she has extensive hemosiderin staining bilaterally in her anterior lower extremity with very fragile skin. Psychiatric Some degree of cognitive impairment. General Notes: Wound exam; ooUpper right lateral calf this is extensively Steri-Stripped. There is very clearly still open wounds under the Steri-Strips. Also worrisome inferiorly is a large area of dark skin which may or may not remain viable I think this was probably folded back into place after the fall ooLeft medial lower leg in 2 spots. These are also Steri-Stripped. ooNo evidence of infection in either area Integumentary (Hair, Skin) Hemosiderin's staining that looks like chronic venous insufficiency in the bilateral lower legs. Wound #4 status is Open. Original cause of wound was Trauma. The wound is located on the Right,Lateral Lower Leg. The wound measures 9cm length x 2.5cm width x 0.1cm depth; 17.671cm^2 area and 1.767cm^3 volume. There is no tunneling or undermining noted. There is a none present amount of drainage noted. The wound margin is distinct with the outline attached to the wound base. There is no granulation within the wound bed. There is no necrotic tissue within the wound bed. General Notes: intact with steri-strips Wound #5 status is Open. Original cause of wound was Trauma. The wound is located on the Left,Distal,Medial Lower Leg. The wound measures 1cm length x 1cm width x 0.1cm depth; 0.785cm^2 area and 0.079cm^3 volume. There is no tunneling or undermining noted. There is a none present amount of drainage noted. The wound margin is distinct with the outline attached to the wound base. There is no granulation within the wound bed. There is no necrotic tissue within the wound  bed. General Notes: intact with ster-strips Wound #6 status is Open. Original cause of wound was Trauma. The wound is located on the Left,Proximal,Medial Lower Leg. The wound measures 1.5cm length x 2.5cm width x 0.1cm depth; 2.945cm^2 area and 0.295cm^3 volume. There is no tunneling or undermining noted. There is a none present amount of drainage noted. The wound margin is distinct with the outline attached to the wound base. There is no granulation within the wound bed. There is no necrotic tissue within the wound bed. 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 - friday Dressing Change Frequency: Do not change entire dressing for one week. - keep steri-strips intact and do not get wet Primary Wound Dressing: Other: - steri-strips to remain intact 1. I left the Steri-Strips intact. Is too early to remove these 2. We will see her back next week soak the Steri-Strips off and see what we need to do underneath. Hopefully the inferior area on the right lateral upper calf will remain intact otherwise were going to have a much larger wound area 3. Otherwise I do not think these need to be specifically dressed at this point 4. I did suggest support stockings and we gave her the number of elastic therapy in [place]. 5. This is a frail elderly woman with some degree of dementia likely osteoporosis. Electronic Signature(s) Signed: 03/08/2020 5:24:49 PM By: Linton Ham MD Entered By: Linton Ham on 03/08/2020 08:27:42 -------------------------------------------------------------------------------- SuperBill Details Patient  Name: Date of  Service: ALISHBA, NAPLES 03/08/2020 Medical Record Number: [id number] Patient Account Number: [id number] Date of Birth/Sex: Treating RN: 1932/07/01 (86 y.o. F) Dwiggins, Shannon Primary Care Provider: Jan Fireman Other Clinician: Referring Provider: Treating Provider/Extender: Edythe Lynn, [place] URA Weeks in Treatment: 11 Diagnosis Coding ICD-10 Codes Code Description L97.818 Non-pressure chronic ulcer of other part of right lower leg with other specified severity L97.828 Non-pressure chronic ulcer of other part of left lower leg with other specified severity E11.622 Type 2 diabetes mellitus with other skin ulcer Z79.01 Long term (current) use of anticoagulants S80.11XD Contusion of right lower leg, subsequent encounter Facility Procedures CPT4 Code: 82800349 Description: 99214 - WOUND CARE VISIT-LEV 4 EST PT Modifier: Quantity: 1 Physician Procedures : CPT4 Code Description Modifier [id number] - WC PHYS LEVEL 3 - EST PT ICD-10 Diagnosis Description L97.818 Non-pressure chronic ulcer of other part of right lower leg with other specified severity L97.828 Non-pressure chronic ulcer of other part of  left lower leg with other specified severity E11.622 Type 2 diabetes mellitus with other skin ulcer Quantity: 1 Electronic Signature(s) Signed: 03/08/2020 5:24:49 PM By: Linton Ham MD Entered By: Linton Ham on 03/08/2020 08:28:00

## 2020-03-08 NOTE — Progress Notes (Signed)
ROSELL, KHOURI ([id number]) Visit Report for 03/08/2020 Fall Risk Assessment Details Patient Name: Date of Service: KIM, OKI 03/08/2020 7:30 A M Medical Record Number: [id number] Patient Account Number: [id number] Date of Birth/Sex: Treating RN: 09-15-1932 (86 y.o. Helene Shoe, Meta.Reding Primary Care Ellayna Hilligoss: Jan Fireman Other Clinician: Referring Orian Figueira: Treating Sama Arauz/Extender: Edythe Lynn, Bo Merino Weeks in Treatment: 11 Fall Risk Assessment Items Have you had 2 or more falls in the last 12 monthso 0 Yes Have you had any fall that resulted in injury in the last 12 monthso 0 Yes FALLS RISK SCREEN History of falling - immediate or within 3 months 25 Yes Secondary diagnosis (Do you have 2 or more medical diagnoseso) 0 No Ambulatory aid None/bed rest/wheelchair/nurse 0 Yes Crutches/cane/walker 0 No Furniture 0 No Intravenous therapy Access/Saline/Heparin Lock 0 No Gait/Transferring Normal/ bed rest/ wheelchair 0 Yes Weak (short steps with or without shuffle, stooped but able to lift head while walking, may seek 0 No support from furniture) Impaired (short steps with shuffle, may have difficulty arising from chair, head down, impaired 0 No balance) Mental Status Oriented to own ability 0 Yes Electronic Signature(s) Signed: 03/08/2020 5:30:12 PM By: Deon Pilling Entered By: Deon Pilling on 03/08/2020 07:59:17

## 2020-03-08 NOTE — Progress Notes (Addendum)
[name], [name] ([id number]) Visit Report for 03/08/2020 Arrival Information Details Patient Name: Date of Service: [name] 03/08/2020 7:30 A M Medical Record Number: [id number] Patient Account Number: [id number] Date of Birth/Sex: Treating RN: 19-Feb-1932 (86 y.o. [name], [name]: Jan Fireman Other Clinician: Referring Stasia Somero: Treating Jewelle Whitner/Extender: Edythe Lynn, Bo Merino Weeks in Treatment: 11 Visit Information History Since Last Visit Added or deleted any medications: No Patient Arrived: Ambulatory Any new allergies or adverse reactions: No Arrival Time: 07:45 Had a fall or experienced change in Yes Accompanied By: daughter activities of daily living that may affect Transfer Assistance: None risk of falls: Patient Identification Verified: Yes Signs or symptoms of abuse/neglect since last visito No Secondary Verification Process Completed: Yes Hospitalized since last visit: No Patient Requires Transmission-Based Precautions: No Implantable device outside of the clinic excluding No Patient Has Alerts: Yes cellular tissue based products placed in the center Patient Alerts: Patient on Blood Thinner since last visit: Has Dressing in Place as Prescribed: Yes Pain Present Now: No Notes Patient fell on Monday at home on back porch. Patient has three notable skin tears. Patient went to Urgent Care Monday where steri-strips were placed. The steri- strips are intact. Will notify MD and case manager about the skin tears and steri-strips being intact. Electronic Signature(s) Signed: 03/08/2020 5:30:12 PM By: Deon Pilling Entered By: Deon Pilling on 03/08/2020 07:59:04 -------------------------------------------------------------------------------- Clinic Level of Care Assessment Details Patient Name: Date of Service: [name], [name] 03/08/2020 7:30 A M Medical Record Number: [id number] Patient Account Number: [id number] Date of Birth/Sex:  Treating RN: [birth year] (86 y.o. F) [name], Shannon Primary Care [name]: Jan Fireman Other Clinician: Referring [name]: Treating [name]/Extender: Edythe [place], [place] U[place] Weeks in Treatment: 11 Clinic Level of Care Assessment Items TOOL 4 Quantity Score X- 1 0 Use when only an EandM is performed on FOLLOW-UP visit ASSESSMENTS - Nursing Assessment / Reassessment X- 1 10 Reassessment of Co-morbidities (includes updates in patient status) X- 1 5 Reassessment of Adherence to Treatment Plan ASSESSMENTS - Wound and Skin A ssessment / Reassessment []  - 0 Simple Wound Assessment / Reassessment - one wound X- 3 5 Complex Wound Assessment / Reassessment - multiple wounds []  - 0 Dermatologic / Skin Assessment (not related to wound area) ASSESSMENTS - Focused Assessment X- 2 5 Circumferential Edema Measurements - multi extremities []  - 0 Nutritional Assessment / Counseling / Intervention []  - 0 Lower Extremity Assessment (monofilament, tuning fork, pulses) []  - 0 Peripheral Arterial Disease Assessment (using hand held doppler) ASSESSMENTS - Ostomy and/or Continence Assessment and Care []  - 0 Incontinence Assessment and Management []  - 0 Ostomy Care Assessment and Management (repouching, etc.) PROCESS - Coordination of Care X - Simple Patient / Family Education for ongoing care 1 15 []  - 0 Complex (extensive) Patient / Family Education for ongoing care X- 1 10 Staff obtains Programmer, systems, Records, T Results / Process Orders est []  - 0 Staff telephones HHA, Nursing Homes / Clarify orders / etc []  - 0 Routine Transfer to another Facility (non-emergent condition) []  - 0 Routine Hospital Admission (non-emergent condition) []  - 0 New Admissions / Biomedical engineer / Ordering NPWT Apligraf, etc. , []  - 0 Emergency Hospital Admission (emergent condition) X- 1 10 Simple Discharge Coordination []  - 0 Complex (extensive) Discharge Coordination PROCESS - Special  Needs []  - 0 Pediatric / Minor Patient Management []  - 0 Isolation Patient Management []  - 0 Hearing / Language / Visual special needs []  - 0 Assessment of  Community assistance (transportation, D/C planning, etc.) []  - 0 Additional assistance / Altered mentation []  - 0 Support Surface(s) Assessment (bed, cushion, seat, etc.) INTERVENTIONS - Wound Cleansing / Measurement []  - 0 Simple Wound Cleansing - one wound X- 3 5 Complex Wound Cleansing - multiple wounds X- 1 5 Wound Imaging (photographs - any number of wounds) []  - 0 Wound Tracing (instead of photographs) []  - 0 Simple Wound Measurement - one wound X- 3 5 Complex Wound Measurement - multiple wounds INTERVENTIONS - Wound Dressings X - Small Wound Dressing one or multiple wounds 3 10 []  - 0 Medium Wound Dressing one or multiple wounds []  - 0 Large Wound Dressing one or multiple wounds []  - 0 Application of Medications - topical []  - 0 Application of Medications - injection INTERVENTIONS - Miscellaneous []  - 0 External ear exam []  - 0 Specimen Collection (cultures, biopsies, blood, body fluids, etc.) []  - 0 Specimen(s) / Culture(s) sent or taken to Lab for analysis []  - 0 Patient Transfer (multiple staff / Civil Service fast streamer / Similar devices) []  - 0 Simple Staple / Suture removal (25 or less) []  - 0 Complex Staple / Suture removal (26 or more) []  - 0 Hypo / Hyperglycemic Management (close monitor of Blood Glucose) []  - 0 Ankle / Brachial Index (ABI) - do not check if billed separately X- 1 5 Vital Signs Has the patient been seen at the hospital within the last three years: Yes Total Score: 145 Level Of Care: New/Established - Level 4 Electronic Signature(s) Signed: 03/08/2020 5:07:15 PM By: Kela Millin Entered By: Kela Millin on 03/08/2020 08:23:52 -------------------------------------------------------------------------------- Encounter Discharge Information Details Patient Name: Date of  Service: [name], [name] 03/08/2020 7:30 A M Medical Record Number: [id number] Patient Account Number: [id number] Date of Birth/Sex: Treating RN: 26-Sep-1931 (86 y.o. [name] Primary Care Loralei Radcliffe: Jan Fireman Other Clinician: Referring Blanche Scovell: Treating Tarina Volk/Extender: Lacie Draft Weeks in Treatment: 11 Encounter Discharge Information Items Discharge Condition: Stable Ambulatory Status: Ambulatory Discharge Destination: Home Transportation: Private Auto Accompanied By: daughter Schedule Follow-up Appointment: Yes Clinical Summary of Care: Patient Declined Electronic Signature(s) Signed: 03/08/2020 5:07:15 PM By: Kela Millin Entered By: Kela Millin on 03/08/2020 08:25:04 -------------------------------------------------------------------------------- Lower Extremity Assessment Details Patient Name: Date of Service: [name], [name] 03/08/2020 7:30 A M Medical Record Number: [id number] Patient Account Number: [id number] Date of Birth/Sex: Treating RN: 1932/08/15 (86 y.o. [name] Primary Care Marika Mahaffy: Jan Fireman Other Clinician: Referring Sigismund Cross: Treating Alissia Lory/Extender: Edythe [place], [place] Weeks in Treatment: 11 Edema Assessment Assessed: [Left: Yes] [Right: Yes] Edema: [Left: No] [Right: No] Calf Left: Right: Point of Measurement: cm From Medial Instep 29 cm 29 cm Ankle Left: Right: Point of Measurement: cm From Medial Instep 17.5 cm 18 cm Vascular Assessment Pulses: Dorsalis Pedis Palpable: [Left:Yes] [Right:Yes] Electronic Signature(s) Signed: 03/08/2020 5:30:12 PM By: Deon Pilling Entered By: Deon Pilling on 03/08/2020 07:59:57 -------------------------------------------------------------------------------- Multi Wound Chart Details Patient Name: Date of Service: [name], [name] 03/08/2020 7:30 A M Medical Record Number: [id number] Patient Account Number: [id number] Date of  Birth/Sex: Treating RN: 1932-02-14 (86 y.o. [name] Primary Care Delberta Folts: Jan Fireman Other Clinician: Referring Charley Lafrance: Treating Coal Nearhood/Extender: Edythe [place], [place] Weeks in Treatment: 11 Vital Signs Height(in): 67 Pulse(bpm): 84 Weight(lbs): 114 Blood Pressure(mmHg): 117/67 Body Mass Index(BMI): 18 Temperature(F): 98.8 Respiratory Rate(breaths/min): 18 Photos: [4:No Photos Right, Lateral Lower Leg] [5:No Photos Left, Distal, Medial Lower Leg] [6:No Photos Left, Proximal, Medial Lower Leg] Wound Location: [4:Trauma] [5:Trauma] [  6:Trauma] Wounding Event: [4:Trauma, Other] [5:Trauma, Other] [6:Trauma, Other] Primary Etiology: [4:Venous Leg Ulcer] [5:Venous Leg Ulcer] [6:Venous Leg Ulcer] Secondary Etiology: [4:Coronary Artery Disease,] [5:Coronary Artery Disease,] [6:Coronary Artery Disease,] Comorbid History: [4:Hypertension, Myocardial Infarction, Type II Diabetes, Osteoarthritis, Confinement Anxiety 03/04/2020] [5:Hypertension, Myocardial Infarction, Type II Diabetes, Osteoarthritis, Confinement Anxiety 03/04/2020] [6:Hypertension,  Myocardial Infarction, Type II Diabetes, Osteoarthritis, Confinement Anxiety 03/04/2020] Date Acquired: [4:0] [5:0] [6:0] Weeks of Treatment: [4:Open] [5:Open] [6:Open] Wound Status: [4:9x2.5x0.1] [5:1x1x0.1] [6:1.5x2.5x0.1] Measurements L x W x D (cm) [4:17.671] [5:0.785] [6:2.945] A (cm) : rea [4:1.767] [5:0.079] [6:0.295] Volume (cm) : [4:0.00%] [5:N/A] [6:N/A] % Reduction in Area: [4:0.00%] [5:N/A] [6:N/A] % Reduction in Volume: [4:Full Thickness Without Exposed] [5:Full Thickness Without Exposed] [6:Full Thickness Without Exposed] Classification: [4:Support Structures None Present] [5:Support Structures None Present] [6:Support Structures None Present] Exudate Amount: [4:Distinct, outline attached] [5:Distinct, outline attached] [6:Distinct, outline attached] Wound Margin: [4:None Present (0%)] [5:None Present (0%)]  [6:None Present (0%)] Granulation Amount: [4:None Present (0%)] [5:None Present (0%)] [6:None Present (0%)] Necrotic Amount: [4:Fascia: No] [5:Fascia: No] [6:Fascia: No] Exposed Structures: [4:Fat Layer (Subcutaneous Tissue) Exposed: No Tendon: No Muscle: No Joint: No Bone: No None] [5:Fat Layer (Subcutaneous Tissue) Exposed: No Tendon: No Muscle: No Joint: No Bone: No None] [6:Fat Layer (Subcutaneous Tissue) Exposed: No Tendon: No Muscle:  No Joint: No Bone: No None] Epithelialization: [4:intact with steri-strips] [5:intact with ster-strips] [6:N/A] Treatment Notes Electronic Signature(s) Signed: 03/08/2020 5:07:15 PM By: Kela Millin Signed: 03/08/2020 5:24:49 PM By: Linton Ham MD Signed: 03/08/2020 5:24:49 PM By: Linton Ham MD Entered By: Linton Ham on 03/08/2020 08:23:22 -------------------------------------------------------------------------------- Multi-Disciplinary Care Plan Details Patient Name: Date of Service: GLENDIA, OLSHEFSKI 03/08/2020 7:30 A M Medical Record Number: [id number] Patient Account Number: [id number] Date of Birth/Sex: Treating RN: 1932/06/26 (86 y.o. F) Kela Millin Primary Care Theador Jezewski: Jan Fireman Other Clinician: Referring Cheyan Frees: Treating Justn Quale/Extender: Edythe [place], [place] Weeks in Treatment: 11 Active Inactive Nutrition Nursing Diagnoses: Potential for alteratiion in Nutrition/Potential for imbalanced nutrition Goals: Patient/caregiver agrees to and verbalizes understanding of need to use nutritional supplements and/or vitamins as prescribed Date Initiated: 03/08/2020 Target Resolution Date: 03/29/2020 Goal Status: Active Patient/caregiver verbalizes understanding of need to maintain therapeutic glucose control per primary care physician Date Initiated: 03/08/2020 Target Resolution Date: 03/29/2020 Goal Status: Active Interventions: Provide education on elevated blood sugars and impact on wound  healing Notes: Pain, Acute or Chronic Nursing Diagnoses: Pain, acute or chronic: actual or potential Goals: Patient/caregiver will verbalize adequate pain control between visits Date Initiated: 03/08/2020 Target Resolution Date: 03/29/2020 Goal Status: Active Interventions: Provide education on pain management Notes: Electronic Signature(s) Signed: 03/08/2020 5:07:15 PM By: Kela Millin Entered By: Kela Millin on 03/08/2020 08:22:47 -------------------------------------------------------------------------------- Pain Assessment Details Patient Name: Date of Service: KADASIA, KASSING 03/08/2020 7:30 A M Medical Record Number: [id number] Patient Account Number: [id number] Date of Birth/Sex: Treating RN: 14-Sep-1932 (86 y.o. [name] Primary Care Dawanda Mapel: Jan Fireman Other Clinician: Referring Sharlett Lienemann: Treating Malissa Slay/Extender: Edythe [place], [place] Weeks in Treatment: 11 Active Problems Location of Pain Severity and Description of Pain Patient Has Paino No Site Locations Rate the pain. Current Pain Level: 0 Pain Management and Medication Current Pain Management: Medication: No Cold Application: No Rest: No Massage: No Activity: No T.E.N.S.: No Heat Application: No Leg drop or elevation: No Is the Current Pain Management Adequate: Adequate How does your wound impact your activities of daily livingo Sleep: No Bathing: No Appetite: No Relationship With Others: No Bladder Continence: No Emotions: No Bowel Continence:  No Work: No Toileting: No Drive: No Dressing: No Hobbies: No Engineer, maintenance) Signed: 03/08/2020 5:30:12 PM By: Deon Pilling Entered By: Deon Pilling on 03/08/2020 07:59:40 -------------------------------------------------------------------------------- Patient/Caregiver Education Details Patient Name: Date of Service: [name] 6/18/2021andnbsp7:30 [place] Record Number: [id number] Patient Account  Number: [id number] Date of Birth/Gender: Treating RN: [birth year] (86 y.o. [name] Primary Care Physician: Jan Fireman Other Clinician: Referring Physician: Treating Physician/Extender: Harless Litten in Treatment: 11 Education Assessment Education Provided To: Patient Education Topics Provided Elevated Blood Sugar/ Impact on Healing: Handouts: Elevated Blood Sugars: How Do They Affect Wound Healing Methods: Explain/Verbal Responses: State content correctly Pain: Handouts: A Guide to Pain Control Methods: Explain/Verbal Responses: State content correctly Electronic Signature(s) Signed: 03/08/2020 5:07:15 PM By: Kela Millin Entered By: Kela Millin on 03/08/2020 08:23:10 -------------------------------------------------------------------------------- Wound Assessment Details Patient Name: Date of Service: KIERRE, DEINES 03/08/2020 7:30 A M Medical Record Number: [id number] Patient Account Number: [id number] Date of Birth/Sex: Treating RN: 1932/02/03 (86 y.o. F) [name], Shannon Primary Care Ciani Rutten: Jan Fireman Other Clinician: Referring Tashawn Laswell: Treating Jaidynn Balster/Extender: Edythe [place], [place] Weeks in Treatment: 11 Wound Status Wound Number: 4 Primary Trauma, Other Etiology: Wound Location: Right, Lateral Lower Leg Secondary Venous Leg Ulcer Wounding Event: Trauma Etiology: Date Acquired: 03/04/2020 Wound Open Weeks Of Treatment: 0 Status: Clustered Wound: No Comorbid Coronary Artery Disease, Hypertension, Myocardial Infarction, History: Type II Diabetes, Osteoarthritis, Confinement Anxiety Photos Wound Measurements Length: (cm) 9 Width: (cm) 2.5 Depth: (cm) 0.1 Area: (cm) 17.671 Volume: (cm) 1.767 % Reduction in Area: 0% % Reduction in Volume: 0% Epithelialization: None Tunneling: No Undermining: No Wound Description Classification: Full Thickness Without Exposed Support Structures Wound Margin:  Distinct, outline attached Exudate Amount: None Present Foul Odor After Cleansing: No Slough/Fibrino No Wound Bed Granulation Amount: None Present (0%) Exposed Structure Necrotic Amount: None Present (0%) Fascia Exposed: No Fat Layer (Subcutaneous Tissue) Exposed: No Tendon Exposed: No Muscle Exposed: No Joint Exposed: No Bone Exposed: No Assessment Notes intact with steri-strips Treatment Notes Wound #4 (Right, Lateral Lower Leg) Notes no dressing. steri-strips to remain intact until next week follow-up Electronic Signature(s) Signed: 03/13/2020 5:20:20 PM By: Minerva Fester Signed: 03/14/2020 5:29:15 PM By: Kela Millin Previous Signature: 03/08/2020 5:07:15 PM Version By: Kela Millin Entered By: Minerva Fester on 03/13/2020 09:28:47 -------------------------------------------------------------------------------- Wound Assessment Details Patient Name: Date of Service: [name], [name] 03/08/2020 7:30 A M Medical Record Number: [id number] Patient Account Number: [id number] Date of Birth/Sex: Treating RN: [birth year] (86 y.o. F) [name], Shannon Primary Care Lekeya Rollings: Jan Fireman Other Clinician: Referring Amariah Kierstead: Treating Ragen Laver/Extender: Edythe [place], [place] Weeks in Treatment: 11 Wound Status Wound Number: 5 Primary Trauma, Other Etiology: Wound Location: Left, Distal, Medial Lower Leg Secondary Venous Leg Ulcer Wounding Event: Trauma Etiology: Date Acquired: 03/04/2020 Wound Open Weeks Of Treatment: 0 Status: Clustered Wound: No Comorbid Coronary Artery Disease, Hypertension, Myocardial Infarction, History: Type II Diabetes, Osteoarthritis, Confinement Anxiety Photos Wound Measurements Length: (cm) 1 Width: (cm) 1 Depth: (cm) 0.1 Area: (cm) 0.785 Volume: (cm) 0.079 % Reduction in Area: 0% % Reduction in Volume: 0% Epithelialization: None Tunneling: No Undermining: No Wound Description Classification: Full Thickness Without Exposed  Support Structures Wound Margin: Distinct, outline attached Exudate Amount: None Present Foul Odor After Cleansing: No Slough/Fibrino No Wound Bed Granulation Amount: None Present (0%) Exposed Structure Necrotic Amount: None Present (0%) Fascia Exposed: No Fat Layer (Subcutaneous Tissue) Exposed: No Tendon Exposed: No Muscle Exposed: No Joint Exposed: No Bone Exposed: No Assessment  Notes intact with ster-strips Treatment Notes Wound #5 (Left, Distal, Medial Lower Leg) Notes no dressing. steri-strips to remain intact until next week follow-up Electronic Signature(s) Signed: 03/13/2020 5:20:20 PM By: Minerva Fester Signed: 03/14/2020 5:29:15 PM By: Kela Millin Previous Signature: 03/08/2020 5:07:15 PM Version By: Kela Millin Entered By: Minerva Fester on 03/13/2020 09:28:23 -------------------------------------------------------------------------------- Wound Assessment Details Patient Name: Date of Service: [name], [name] 03/08/2020 7:30 A M Medical Record Number: [id number] Patient Account Number: [id number] Date of Birth/Sex: Treating RN: 09/15/1932 (86 y.o. F) [name], Shannon Primary Care Pearla Mckinny: Jan Fireman Other Clinician: Referring Latron Ribas: Treating Zionah Criswell/Extender: Edythe [place], [place] Weeks in Treatment: 11 Wound Status Wound Number: 6 Primary Trauma, Other Etiology: Wound Location: Left, Proximal, Medial Lower Leg Secondary Venous Leg Ulcer Wounding Event: Trauma Etiology: Date Acquired: 03/04/2020 Wound Open Weeks Of Treatment: 0 Status: Clustered Wound: No Comorbid Coronary Artery Disease, Hypertension, Myocardial Infarction, History: Type II Diabetes, Osteoarthritis, Confinement Anxiety Photos Wound Measurements Length: (cm) 1.5 Width: (cm) 2.5 Depth: (cm) 0.1 Area: (cm) 2.945 Volume: (cm) 0.295 % Reduction in Area: 0% % Reduction in Volume: 0% Epithelialization: None Tunneling: No Undermining: No Wound  Description Classification: Full Thickness Without Exposed Support Structures Wound Margin: Distinct, outline attached Exudate Amount: None Present Foul Odor After Cleansing: No Slough/Fibrino No Wound Bed Granulation Amount: None Present (0%) Exposed Structure Necrotic Amount: None Present (0%) Fascia Exposed: No Fat Layer (Subcutaneous Tissue) Exposed: No Tendon Exposed: No Muscle Exposed: No Joint Exposed: No Bone Exposed: No Treatment Notes Wound #6 (Left, Proximal, Medial Lower Leg) Notes no dressing. steri-strips to remain intact until next week follow-up Electronic Signature(s) Signed: 03/13/2020 5:20:20 PM By: Minerva Fester Signed: 03/14/2020 5:29:15 PM By: Kela Millin Previous Signature: 03/08/2020 5:07:15 PM Version By: Kela Millin Entered By: Minerva Fester on 03/13/2020 09:27:50 -------------------------------------------------------------------------------- Vitals Details Patient Name: Date of Service: [name], [name] 03/08/2020 7:30 A M Medical Record Number: [id number] Patient Account Number: [id number] Date of Birth/Sex: Treating RN: 18-Dec-1931 (86 y.o. [name], Karina Primary Care Jamiere Gulas: Jan Fireman Other Clinician: Referring Tessia Kassin: Treating Devonne Lalani/Extender: Edythe [place], [place] Weeks in Treatment: 11 Vital Signs Time Taken: 07:48 Temperature (F): 98.8 Height (in): 67 Pulse (bpm): 84 Weight (lbs): 114 Respiratory Rate (breaths/min): 18 Body Mass Index (BMI): 17.9 Blood Pressure (mmHg): 117/67 Reference Range: 80 - 120 mg / dl Electronic Signature(s) Signed: 03/08/2020 5:30:12 PM By: Deon Pilling Entered By: Deon Pilling on 03/08/2020 07:59:33

## 2020-03-15 ENCOUNTER — Encounter (HOSPITAL_BASED_OUTPATIENT_CLINIC_OR_DEPARTMENT_OTHER): Payer: Medicare Other | Admitting: Internal Medicine

## 2020-03-15 DIAGNOSIS — E11622 Type 2 diabetes mellitus with other skin ulcer: Secondary | ICD-10-CM | POA: Diagnosis not present

## 2020-03-15 NOTE — Progress Notes (Signed)
[name], HACKER ([id number]) Visit Report for 03/15/2020 HPI Details Patient Name: Date of Service: [name], POLINSKY 03/15/2020 8:00 A M Medical Record Number: [id number] Patient Account Number: [id number] Date of Birth/Sex: Treating RN: [birth year] (86 y.o. Clearnce Sorrel Primary Care Provider: Jan Fireman Other Clinician: Referring Provider: Treating Provider/Extender: Edythe Lynn, Bo Merino Weeks in Treatment: 12 History of Present Illness HPI Description: 12/20/2019 patient presents today for initial evaluation here in our clinic concerning issues that she unfortunately has been having with skin tears over the right lower extremity. This happened secondary to a fall that she had initially in December causing some skin tears that for the most part healed and then she sustained a second fall in February 2021. Subsequently the patient did have skin tears which they have been trying to manage at this point and home health has not really been put anything on it as they are waiting for orders from [place]. As far as the daughter is concerned with the patient she has been using Vaseline over the area at this point to try to keep the area moist and nothing would stick to it. The patient is having discomfort unfortunately. Nonetheless I do not see any signs of active infection which is great news. She does have a history of diabetes mellitus type 2, hypertension, and is on anticoagulant therapy due to history of stroke. 12/27/2019 upon evaluation today patient appears to be doing well with regard to her wounds. They do appear to be somewhat dry however. She complains of the Xeroform having been sticking to the wound bed. Again not exactly sure why this would be the case at this point. Nonetheless there is no signs of infection I believe she may actually benefit from [place] currently. 01/03/20 upon evaluation today patient actually appears to be doing well with regard to her wounds at this time. I  see evidence of improvement at both locations and fortunately there is no evidence of active infection at this time. No fevers, chills, nausea, vomiting, or diarrhea. 01/17/2020 upon evaluation today patient actually appears to be doing excellent in regard to her leg ulcer. This is actually healing quite nicely and overall I am very pleased with the progress that is been made. There is no evidence of active infection at this time. 02/28/2020 upon evaluation today patient actually appears to be doing quite well with regard to her wounds. She shows no signs of actually had anything open on the elbow and her legs are still doing well. Fortunately I am very pleased with the overall appearance today. 03/08/2020; this is a patient I have not seen previously although she has been in the clinic twice before including recently on 6/9. She has chronic hemosiderin deposition likely related to venous insufficiency. Her daughter thinks that she was worked up for this when she lived in [place]. She does not use compression stockings or support stockings although she has them. When she was here 9 days ago she did not have anything open. Unfortunately she had a fall while stepping up from her porch into the house. She suffered skin tears quite extensively on the lateral right upper leg and on the left medial lower leg proximally and distally. These are all extensively Steri-Stripped. 6/25; patient had falls with skin tears on the right lateral upper leg and the left medial lower leg last week. Them and Steri-Stripped in urgent care or the ER. When we looked at this last week I did not remove the Steri-Strips. There was  areas of dark skin that I thought were probably a 50-50 proposition of maintaining viability. Fortunately she has actually done exceptionally well. Both wound areas only have small areas that are open the rest of the skin is viable Electronic Signature(s) Signed: 03/15/2020 5:27:17 PM By: Linton Ham  MD Entered By: Linton Ham on 03/15/2020 08:54:42 -------------------------------------------------------------------------------- Physical Exam Details Patient Name: Date of Service: KINGSLEY, [name] 03/15/2020 8:00 A M Medical Record Number: [id number] Patient Account Number: [id number] Date of Birth/Sex: Treating RN: [birth year] (86 y.o. Clearnce Sorrel Primary Care Provider: Jan Fireman Other Clinician: Referring Provider: Treating Provider/Extender: Edythe Lynn, [place] URA Weeks in Treatment: 12 Constitutional Sitting or standing Blood Pressure is within target range for patient.. Pulse regular and within target range for patient.Marland Kitchen Respirations regular, non-labored and within target range.. Temperature is normal and within the target range for the patient.Marland Kitchen Appears in no distress. Notes Wound exam Upper right lateral calf 3 small open areas that appear clean. The rest of this has healthy epithelialization over the substantial part of the original skin flaps Similarly on the left medial lower leg only very tiny areas open under illumination no debridement is required edema control is excellent Electronic Signature(s) Signed: 03/15/2020 5:27:17 PM By: Linton Ham MD Entered By: Linton Ham on 03/15/2020 08:57:31 -------------------------------------------------------------------------------- Physician Orders Details Patient Name: Date of Service: DONNETTA, [name] 03/15/2020 8:00 A M Medical Record Number: [id number] Patient Account Number: [id number] Date of Birth/Sex: Treating RN: 1931/10/26 (86 y.o. F) Dwiggins, Shannon Primary Care Provider: Jan Fireman Other Clinician: Referring Provider: Treating Provider/Extender: Edythe Lynn, Bo Merino Weeks in Treatment: 12 Verbal / Phone Orders: No Diagnosis Coding ICD-10 Coding Code Description L97.818 Non-pressure chronic ulcer of other part of right lower leg with other specified severity L97.828  Non-pressure chronic ulcer of other part of left lower leg with other specified severity E11.622 Type 2 diabetes mellitus with other skin ulcer Z79.01 Long term (current) use of anticoagulants S80.11XD Contusion of right lower leg, subsequent encounter Follow-up Appointments ppointment in 1 week. - friday Return A Dressing Change Frequency Wound #4 Right,Lateral Lower Leg Change Dressing every other day. Wound #5 Left,Distal,Medial Lower Leg Change Dressing every other day. Wound #6 Left,Proximal,Medial Lower Leg Change Dressing every other day. Primary Wound Dressing Wound #4 Right,Lateral Lower Leg Xeroform Wound #5 Left,Distal,Medial Lower Leg Xeroform Wound #6 Left,Proximal,Medial Lower Leg Xeroform Secondary Dressing Wound #4 Right,Lateral Lower Leg Kerlix/Rolled Gauze Dry Gauze Wound #5 Left,Distal,Medial Lower Leg Kerlix/Rolled Gauze Dry Gauze Wound #6 Left,Proximal,Medial Lower Leg Kerlix/Rolled Gauze Dry Gauze Electronic Signature(s) Signed: 03/15/2020 5:01:22 PM By: Kela Millin Signed: 03/15/2020 5:27:17 PM By: Linton Ham MD Signed: 03/15/2020 5:27:17 PM By: Linton Ham MD Entered By: Kela Millin on 03/15/2020 08:41:47 -------------------------------------------------------------------------------- Problem List Details Patient Name: Date of Service: ELIE, GRAGERT 03/15/2020 8:00 A M Medical Record Number: [id number] Patient Account Number: [id number] Date of Birth/Sex: Treating RN: 08/16/1932 (86 y.o. Clearnce Sorrel Primary Care Provider: Jan Fireman Other Clinician: Referring Provider: Treating Provider/Extender: Edythe Lynn, Bo Merino Weeks in Treatment: 12 Active Problems ICD-10 Encounter Code Description Active Date MDM Diagnosis L97.818 Non-pressure chronic ulcer of other part of right lower leg with other specified 03/08/2020 No Yes severity L97.828 Non-pressure chronic ulcer of other part of left lower leg with  other specified 03/08/2020 No Yes severity E11.622 Type 2 diabetes mellitus with other skin ulcer 12/20/2019 No Yes Z79.01 Long term (current) use of anticoagulants 12/20/2019 No Yes S80.11XD Contusion of right lower leg, subsequent  encounter 03/08/2020 No Yes Inactive Problems ICD-10 Code Description Active Date Inactive Date L97.822 Non-pressure chronic ulcer of other part of left lower leg with fat layer exposed 12/20/2019 12/20/2019 Resolved Problems ICD-10 Code Description Active Date Resolved Date S81.802A Unspecified open wound, left lower leg, initial encounter 12/20/2019 12/20/2019 Electronic Signature(s) Signed: 03/15/2020 5:27:17 PM By: Linton Ham MD Entered By: Linton Ham on 03/15/2020 08:53:31 -------------------------------------------------------------------------------- Progress Note Details Patient Name: Date of Service: ALLAN, [name] 03/15/2020 8:00 A M Medical Record Number: [id number] Patient Account Number: [id number] Date of Birth/Sex: Treating RN: 09/14/1932 (86 y.o. Clearnce Sorrel Primary Care Provider: Jan Fireman Other Clinician: Referring Provider: Treating Provider/Extender: Edythe Lynn, [place] URA Weeks in Treatment: 12 Subjective History of Present Illness (HPI) 12/20/2019 patient presents today for initial evaluation here in our clinic concerning issues that she unfortunately has been having with skin tears over the right lower extremity. This happened secondary to a fall that she had initially in December causing some skin tears that for the most part healed and then she sustained a second fall in February 2021. Subsequently the patient did have skin tears which they have been trying to manage at this point and home health has not really been put anything on it as they are waiting for orders from [place]. As far as the daughter is concerned with the patient she has been using Vaseline over the area at this point to try to keep the area moist  and nothing would stick to it. The patient is having discomfort unfortunately. Nonetheless I do not see any signs of active infection which is great news. She does have a history of diabetes mellitus type 2, hypertension, and is on anticoagulant therapy due to history of stroke. 12/27/2019 upon evaluation today patient appears to be doing well with regard to her wounds. They do appear to be somewhat dry however. She complains of the Xeroform having been sticking to the wound bed. Again not exactly sure why this would be the case at this point. Nonetheless there is no signs of infection I believe she may actually benefit from [place] currently. 01/03/20 upon evaluation today patient actually appears to be doing well with regard to her wounds at this time. I see evidence of improvement at both locations and fortunately there is no evidence of active infection at this time. No fevers, chills, nausea, vomiting, or diarrhea. 01/17/2020 upon evaluation today patient actually appears to be doing excellent in regard to her leg ulcer. This is actually healing quite nicely and overall I am very pleased with the progress that is been made. There is no evidence of active infection at this time. 02/28/2020 upon evaluation today patient actually appears to be doing quite well with regard to her wounds. She shows no signs of actually had anything open on the elbow and her legs are still doing well. Fortunately I am very pleased with the overall appearance today. 03/08/2020; this is a patient I have not seen previously although she has been in the clinic twice before including recently on 6/9. She has chronic hemosiderin deposition likely related to venous insufficiency. Her daughter thinks that she was worked up for this when she lived in [place]. She does not use compression stockings or support stockings although she has them. When she was here 9 days ago she did not have anything open. Unfortunately she had a fall while  stepping up from her porch into the house. She suffered skin tears quite extensively on the lateral  right upper leg and on the left medial lower leg proximally and distally. These are all extensively Steri-Stripped. 6/25; patient had falls with skin tears on the right lateral upper leg and the left medial lower leg last week. Them and Steri-Stripped in urgent care or the ER. When we looked at this last week I did not remove the Steri-Strips. There was areas of dark skin that I thought were probably a 50-50 proposition of maintaining viability. Fortunately she has actually done exceptionally well. Both wound areas only have small areas that are open the rest of the skin is viable Objective Constitutional Sitting or standing Blood Pressure is within target range for patient.. Pulse regular and within target range for patient.Marland Kitchen Respirations regular, non-labored and within target range.. Temperature is normal and within the target range for the patient.Marland Kitchen Appears in no distress. Vitals Time Taken: 8:07 AM, Height: 67 in, Source: Stated, Weight: 114 lbs, Source: Stated, BMI: 17.9, Temperature: 98.8 F, Pulse: 67 bpm, Respiratory Rate: 18 breaths/min, Blood Pressure: 130/58 mmHg. General Notes: Wound exam ooUpper right lateral calf 3 small open areas that appear clean. The rest of this has healthy epithelialization over the substantial part of the original skin flaps ooSimilarly on the left medial lower leg only very tiny areas open under illumination no debridement is required edema control is excellent Integumentary (Hair, Skin) Wound #4 status is Open. Original cause of wound was Trauma. The wound is located on the Right,Lateral Lower Leg. The wound measures 8cm length x 2.5cm width x 0.1cm depth; 15.708cm^2 area and 1.571cm^3 volume. There is Fat Layer (Subcutaneous Tissue) Exposed exposed. There is no tunneling or undermining noted. There is a medium amount of serosanguineous drainage noted. The  wound margin is distinct with the outline attached to the wound base. There is large (67-100%) red granulation within the wound bed. There is no necrotic tissue within the wound bed. Wound #5 status is Open. Original cause of wound was Trauma. The wound is located on the Left,Distal,Medial Lower Leg. The wound measures 0.1cm length x 0.1cm width x 0.1cm depth; 0.008cm^2 area and 0.001cm^3 volume. The wound is limited to skin breakdown. There is no tunneling or undermining noted. There is a small amount of serosanguineous drainage noted. The wound margin is flat and intact. There is no granulation within the wound bed. There is no necrotic tissue within the wound bed. Wound #6 status is Open. Original cause of wound was Trauma. The wound is located on the Left,Proximal,Medial Lower Leg. The wound measures 0.3cm length x 2.5cm width x 0.1cm depth; 0.589cm^2 area and 0.059cm^3 volume. There is Fat Layer (Subcutaneous Tissue) Exposed exposed. There is no tunneling or undermining noted. There is a small amount of serosanguineous drainage noted. The wound margin is flat and intact. There is medium (34-66%) red granulation within the wound bed. There is no necrotic tissue within the wound bed. 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 - friday Dressing Change Frequency: Wound #4 Right,Lateral Lower Leg: Change Dressing every other day. Wound #5 Left,Distal,Medial Lower Leg: Change Dressing every other day. Wound #6 Left,Proximal,Medial Lower Leg: Change Dressing every other day. Primary Wound Dressing: Wound #4 Right,Lateral Lower  Leg: Xeroform  Wound #5 Left,Distal,Medial Lower Leg: Xeroform Wound #6 Left,Proximal,Medial Lower Leg: Xeroform Secondary Dressing: Wound #4 Right,Lateral Lower Leg: Kerlix/Rolled Gauze Dry Gauze Wound #5 Left,Distal,Medial Lower Leg: Kerlix/Rolled Gauze Dry Gauze Wound #6 Left,Proximal,Medial Lower Leg: Kerlix/Rolled Gauze Dry Gauze #1 Xeroform and Kerlix change every second day 2. This should be closed by next week 3. They asked me about falls and physical therapy. I told him to talk to their primary doctor I think in-home physical therapy is important but also her medical falls assessment should be done she has had several falls this year Electronic Signature(s) Signed: 03/15/2020 5:27:17 PM By: Linton Ham MD Entered By: Linton Ham on 03/15/2020 08:59:14 -------------------------------------------------------------------------------- [place] Details Patient Name: Date of Service: MARLENE, [name] 03/15/2020 Medical Record Number: [id number] Patient Account Number: [id number] Date of Birth/Sex: Treating RN: 1931-12-12 (86 y.o. F) Dwiggins, Shannon Primary Care Provider: Jan Fireman Other Clinician: Referring Provider: Treating Provider/Extender: Edythe Lynn, [place] URA Weeks in Treatment: 12 Diagnosis Coding ICD-10 Codes Code Description L97.818 Non-pressure chronic ulcer of other part of right lower leg with other specified severity L97.828 Non-pressure chronic ulcer of other part of left lower leg with other specified severity E11.622 Type 2 diabetes mellitus with other skin ulcer Z79.01 Long term (current) use of anticoagulants S80.11XD Contusion of right lower leg, subsequent encounter Facility Procedures CPT4 Code: [id number] Description: 99214 - WOUND CARE VISIT-LEV 4 EST PT Modifier: Quantity: 1 Physician Procedures : CPT4 Code Description Modifier [id number] - WC PHYS LEVEL 3 - EST PT ICD-10 Diagnosis Description L97.818 Non-pressure chronic  ulcer of other part of right lower leg with other specified severity S80.11XD Contusion of right lower leg, subsequent  encounter L97.828 Non-pressure chronic ulcer of other part of left lower leg with other specified severity E11.622 Type 2 diabetes mellitus with other skin ulcer Quantity: 1 Electronic Signature(s) Signed: 03/15/2020 5:27:17 PM By: Linton Ham MD Entered By: Linton Ham on 03/15/2020 09:00:34

## 2020-03-15 NOTE — Progress Notes (Signed)
ZIVA, NUNZIATA ([id number]) Visit Report for 03/15/2020 Arrival Information Details Patient Name: Date of Service: [name], [name] 03/15/2020 8:00 A M Medical Record Number: [id number] Patient Account Number: [id number] Date of Birth/Sex: Treating RN: March 31, 1932 (86 y.o. [name] Primary Care Zelia Yzaguirre: Jan Fireman Other Clinician: Referring Nataliyah Packham: Treating Dalyce Renne/Extender: Edythe Lynn, Bo Merino Weeks in Treatment: 12 Visit Information History Since Last Visit Added or deleted any medications: No Patient Arrived: Walker Any new allergies or adverse reactions: No Arrival Time: 08:03 Had a fall or experienced change in No Accompanied By: dgt activities of daily living that may affect Transfer Assistance: None risk of falls: Patient Identification Verified: Yes Signs or symptoms of abuse/neglect since last visito No Secondary Verification Process Completed: Yes Hospitalized since last visit: No Patient Requires Transmission-Based Precautions: No Implantable device outside of the clinic excluding No Patient Has Alerts: Yes cellular tissue based products placed in the center Patient Alerts: Patient on Blood Thinner since last visit: Has Dressing in Place as Prescribed: Yes Pain Present Now: No Electronic Signature(s) Signed: 03/15/2020 4:55:10 PM By: Baruch Gouty RN, BSN Entered By: Baruch Gouty on 03/15/2020 08:07:23 -------------------------------------------------------------------------------- Clinic Level of Care Assessment Details Patient Name: Date of Service: [name], [name] 03/15/2020 8:00 A M Medical Record Number: [id number] Patient Account Number: [id number] Date of Birth/Sex: Treating RN: [birth year] (86 y.o. F) Dwiggins, Shannon Primary Care Witten Certain: Jan Fireman Other Clinician: Referring Clenton Esper: Treating Yanet Balliet/Extender: Edythe Lynn, Bo Merino Weeks in Treatment: 12 Clinic Level of Care Assessment Items TOOL 4 Quantity  Score X- 1 0 Use when only an EandM is performed on FOLLOW-UP visit ASSESSMENTS - Nursing Assessment / Reassessment X- 1 10 Reassessment of Co-morbidities (includes updates in patient status) X- 1 5 Reassessment of Adherence to Treatment Plan ASSESSMENTS - Wound and Skin A ssessment / Reassessment []  - 0 Simple Wound Assessment / Reassessment - one wound X- 3 5 Complex Wound Assessment / Reassessment - multiple wounds []  - 0 Dermatologic / Skin Assessment (not related to wound area) ASSESSMENTS - Focused Assessment X- 2 5 Circumferential Edema Measurements - multi extremities []  - 0 Nutritional Assessment / Counseling / Intervention []  - 0 Lower Extremity Assessment (monofilament, tuning fork, pulses) []  - 0 Peripheral Arterial Disease Assessment (using hand held doppler) ASSESSMENTS - Ostomy and/or Continence Assessment and Care []  - 0 Incontinence Assessment and Management []  - 0 Ostomy Care Assessment and Management (repouching, etc.) PROCESS - Coordination of Care X - Simple Patient / Family Education for ongoing care 1 15 []  - 0 Complex (extensive) Patient / Family Education for ongoing care X- 1 10 Staff obtains Programmer, systems, Records, T Results / Process Orders est []  - 0 Staff telephones HHA, Nursing Homes / Clarify orders / etc []  - 0 Routine Transfer to another Facility (non-emergent condition) []  - 0 Routine Hospital Admission (non-emergent condition) []  - 0 New Admissions / Biomedical engineer / Ordering NPWT Apligraf, etc. , []  - 0 Emergency Hospital Admission (emergent condition) X- 1 10 Simple Discharge Coordination []  - 0 Complex (extensive) Discharge Coordination PROCESS - Special Needs []  - 0 Pediatric / Minor Patient Management []  - 0 Isolation Patient Management []  - 0 Hearing / Language / Visual special needs []  - 0 Assessment of Community assistance (transportation, D/C planning, etc.) []  - 0 Additional assistance / Altered  mentation []  - 0 Support Surface(s) Assessment (bed, cushion, seat, etc.) INTERVENTIONS - Wound Cleansing / Measurement []  - 0 Simple Wound Cleansing - one wound X- 3 5 Complex  Wound Cleansing - multiple wounds X- 1 5 Wound Imaging (photographs - any number of wounds) []  - 0 Wound Tracing (instead of photographs) []  - 0 Simple Wound Measurement - one wound X- 3 5 Complex Wound Measurement - multiple wounds INTERVENTIONS - Wound Dressings X - Small Wound Dressing one or multiple wounds 3 10 []  - 0 Medium Wound Dressing one or multiple wounds []  - 0 Large Wound Dressing one or multiple wounds X- 1 5 Application of Medications - topical []  - 0 Application of Medications - injection INTERVENTIONS - Miscellaneous []  - 0 External ear exam []  - 0 Specimen Collection (cultures, biopsies, blood, body fluids, etc.) []  - 0 Specimen(s) / Culture(s) sent or taken to Lab for analysis []  - 0 Patient Transfer (multiple staff / Civil Service fast streamer / Similar devices) []  - 0 Simple Staple / Suture removal (25 or less) []  - 0 Complex Staple / Suture removal (26 or more) []  - 0 Hypo / Hyperglycemic Management (close monitor of Blood Glucose) []  - 0 Ankle / Brachial Index (ABI) - do not check if billed separately X- 1 5 Vital Signs Has the patient been seen at the hospital within the last three years: Yes Total Score: 150 Level Of Care: New/Established - Level 4 Electronic Signature(s) Signed: 03/15/2020 5:01:22 PM By: Kela Millin Entered By: Kela Millin on 03/15/2020 08:43:09 -------------------------------------------------------------------------------- Encounter Discharge Information Details Patient Name: Date of Service: [name], [name] 03/15/2020 8:00 A M Medical Record Number: [id number] Patient Account Number: [id number] Date of Birth/Sex: Treating RN: 05-08-1932 (86 y.o. Nancy Fetter Primary Care Saryna Kneeland: Jan Fireman Other Clinician: Referring Keishawna Carranza: Treating  Valeri Sula/Extender: Edythe Lynn, Bo Merino Weeks in Treatment: 12 Encounter Discharge Information Items Discharge Condition: Stable Ambulatory Status: Walker Discharge Destination: Home Transportation: Private Auto Accompanied By: daughter Schedule Follow-up Appointment: Yes Clinical Summary of Care: Patient Declined Electronic Signature(s) Signed: 03/15/2020 5:45:26 PM By: Levan Hurst RN, BSN Entered By: Levan Hurst on 03/15/2020 09:01:28 -------------------------------------------------------------------------------- Lower Extremity Assessment Details Patient Name: Date of Service: [name], [name] 03/15/2020 8:00 A M Medical Record Number: [id number] Patient Account Number: [id number] Date of Birth/Sex: Treating RN: 1932/02/07 (86 y.o. [name] Primary Care Amand Lemoine: Jan Fireman Other Clinician: Referring Troyce Gieske: Treating Wynne Jury/Extender: Edythe Lynn, [place] URA Weeks in Treatment: 12 Edema Assessment Assessed: [Left: No] [Right: No] Edema: [Left: No] [Right: No] Calf Left: Right: Point of Measurement: cm From Medial Instep 29 cm 29 cm Ankle Left: Right: Point of Measurement: cm From Medial Instep 17.5 cm 18 cm Vascular Assessment Pulses: Dorsalis Pedis Palpable: [Left:Yes] [Right:Yes] Electronic Signature(s) Signed: 03/15/2020 4:55:10 PM By: Baruch Gouty RN, BSN Entered By: Baruch Gouty on 03/15/2020 08:09:07 -------------------------------------------------------------------------------- Multi Wound Chart Details Patient Name: Date of Service: [name], [name] 03/15/2020 8:00 A M Medical Record Number: [id number] Patient Account Number: [id number] Date of Birth/Sex: Treating RN: [birth year] (86 y.o. Clearnce Sorrel Primary Care Anett Ranker: Jan Fireman Other Clinician: Referring Fitzhugh Vizcarrondo: Treating Trentan Trippe/Extender: Edythe Lynn, [place] URA Weeks in Treatment: 12 Vital Signs Height(in): 67 Pulse(bpm): 40 Weight(lbs):  114 Blood Pressure(mmHg): 130/58 Body Mass Index(BMI): 18 Temperature(F): 98.8 Respiratory Rate(breaths/min): 18 Photos: [4:No Photos Right, Lateral Lower Leg] [5:No Photos Left, Distal, Medial Lower Leg] [6:No Photos Left, Proximal, Medial Lower Leg] Wound Location: [4:Trauma] [5:Trauma] [6:Trauma] Wounding Event: [4:Trauma, Other] [5:Trauma, Other] [6:Trauma, Other] Primary Etiology: [4:Venous Leg Ulcer] [5:Venous Leg Ulcer] [6:Venous Leg Ulcer] Secondary Etiology: [4:Coronary Artery Disease,] [5:Coronary Artery Disease,] [6:Coronary Artery Disease,] Comorbid History: [4:Hypertension, Myocardial Infarction, Type II Diabetes,  Osteoarthritis, Confinement Anxiety 03/04/2020] [5:Hypertension, Myocardial Infarction, Type II Diabetes, Osteoarthritis, Confinement Anxiety 03/04/2020] [6:Hypertension,  Myocardial Infarction, Type II Diabetes, Osteoarthritis, Confinement Anxiety 03/04/2020] Date Acquired: [4:1] [5:1] [6:1] Weeks of Treatment: [4:Open] [5:Open] [6:Open] Wound Status: [4:8x2.5x0.1] [5:0.1x0.1x0.1] [6:0.3x2.5x0.1] Measurements L x W x D (cm) [4:15.708] [5:0.008] [6:0.589] A (cm) : rea [4:1.571] [5:0.001] [6:0.059] Volume (cm) : [4:11.10%] [5:99.00%] [6:80.00%] % Reduction in Area: [4:11.10%] [5:98.70%] [6:80.00%] % Reduction in Volume: [4:Full Thickness Without Exposed] [5:Full Thickness Without Exposed] [6:Full Thickness Without Exposed] Classification: [4:Support Structures Medium] [5:Support Structures Small] [6:Support Structures Small] Exudate Amount: [4:Serosanguineous] [5:Serosanguineous] [6:Serosanguineous] Exudate Type: [4:red, brown] [5:red, brown] [6:red, brown] Exudate Color: [4:Distinct, outline attached] [5:Flat and Intact] [6:Flat and Intact] Wound Margin: [4:Large (67-100%)] [5:None Present (0%)] [6:Medium (34-66%)] Granulation Amount: [4:Red] [5:N/A] [6:Red] Granulation Quality: [4:None Present (0%)] [5:None Present (0%)] [6:None Present (0%)] Necrotic Amount:  [4:Fat Layer (Subcutaneous Tissue)] [5:Fascia: No] [6:Fat Layer (Subcutaneous Tissue)] Exposed Structures: [4:Exposed: Yes Fascia: No Tendon: No Muscle: No Joint: No Bone: No Large (67-100%)] [5:Fat Layer (Subcutaneous Tissue) Exposed: No Tendon: No Muscle: No Joint: No Bone: No Limited to Skin Breakdown Large (67-100%)] [6:Exposed: Yes Fascia: No Tendon: No  Muscle: No Joint: No Bone: No Large (67-100%)] Treatment Notes Electronic Signature(s) Signed: 03/15/2020 5:01:22 PM By: Kela Millin Signed: 03/15/2020 5:27:17 PM By: Linton Ham MD Entered By: Linton Ham on 03/15/2020 08:53:41 -------------------------------------------------------------------------------- Multi-Disciplinary Care Plan Details Patient Name: Date of Service: [name], [name] 03/15/2020 8:00 A M Medical Record Number: [id number] Patient Account Number: [id number] Date of Birth/Sex: Treating RN: 1931-12-27 (86 y.o. F) Kela Millin Primary Care Oluwatobi Visser: Jan Fireman Other Clinician: Referring Chantelle Verdi: Treating Selby Slovacek/Extender: Edythe Lynn, [place] URA Weeks in Treatment: 12 Active Inactive Nutrition Nursing Diagnoses: Potential for alteratiion in Nutrition/Potential for imbalanced nutrition Goals: Patient/caregiver agrees to and verbalizes understanding of need to use nutritional supplements and/or vitamins as prescribed Date Initiated: 03/08/2020 Target Resolution Date: 03/29/2020 Goal Status: Active Patient/caregiver verbalizes understanding of need to maintain therapeutic glucose control per primary care physician Date Initiated: 03/08/2020 Target Resolution Date: 03/29/2020 Goal Status: Active Interventions: Provide education on elevated blood sugars and impact on wound healing Notes: Pain, Acute or Chronic Nursing Diagnoses: Pain, acute or chronic: actual or potential Goals: Patient/caregiver will verbalize adequate pain control between visits Date Initiated: 03/08/2020 Target  Resolution Date: 03/29/2020 Goal Status: Active Interventions: Provide education on pain management Notes: Electronic Signature(s) Signed: 03/15/2020 5:01:22 PM By: Kela Millin Entered By: Kela Millin on 03/15/2020 08:25:13 -------------------------------------------------------------------------------- Pain Assessment Details Patient Name: Date of Service: [name], [name] 03/15/2020 8:00 A M Medical Record Number: [id number] Patient Account Number: [id number] Date of Birth/Sex: Treating RN: [birth year] (86 y.o. [name] Primary Care Dany Harten: Jan Fireman Other Clinician: Referring Kalilah Barua: Treating Tanisha Lutes/Extender: Edythe [place], [place] URA Weeks in Treatment: 12 Active Problems Location of Pain Severity and Description of Pain Patient Has Paino No Site Locations Rate the pain. Current Pain Level: 0 Pain Management and Medication Current Pain Management: Electronic Signature(s) Signed: 03/15/2020 4:55:10 PM By: Baruch Gouty RN, BSN Entered By: Baruch Gouty on 03/15/2020 08:08:04 -------------------------------------------------------------------------------- Patient/Caregiver Education Details Patient Name: Date of Service: [name], [name] 6/25/2021andnbsp8:00 [place] Record Number: [id number] Patient Account Number: [id number] Date of Birth/Gender: Treating RN: [birth year] (86 y.o. Clearnce Sorrel Primary Care Physician: Jan Fireman Other Clinician: Referring Physician: Treating Physician/Extender: Harless Litten in Treatment: 12 Education Assessment Education Provided To: Patient Education Topics Provided Elevated Blood Sugar/ Impact on Healing: Handouts: Elevated Blood  Sugars: How Do They Affect Wound Healing Methods: Explain/Verbal Responses: State content correctly Pain: Handouts: A Guide to Pain Control Methods: Explain/Verbal Responses: State content correctly Electronic Signature(s) Signed:  03/15/2020 5:01:22 PM By: Kela Millin Entered By: Kela Millin on 03/15/2020 08:25:31 -------------------------------------------------------------------------------- Wound Assessment Details Patient Name: Date of Service: [name], [name] 03/15/2020 8:00 A M Medical Record Number: [id number] Patient Account Number: [id number] Date of Birth/Sex: Treating RN: 11/20/1931 (86 y.o. [name] Primary Care Maicol Bowland: Jan Fireman Other Clinician: Referring Turon Kilmer: Treating Afrika Brick/Extender: Edythe Lynn, [place] URA Weeks in Treatment: 12 Wound Status Wound Number: 4 Primary Trauma, Other Etiology: Wound Location: Right, Lateral Lower Leg Secondary Venous Leg Ulcer Wounding Event: Trauma Etiology: Date Acquired: 03/04/2020 Wound Open Weeks Of Treatment: 1 Status: Clustered Wound: No Comorbid Coronary Artery Disease, Hypertension, Myocardial Infarction, History: Type II Diabetes, Osteoarthritis, Confinement Anxiety Wound Measurements Length: (cm) 8 Width: (cm) 2.5 Depth: (cm) 0.1 Area: (cm) 15.708 Volume: (cm) 1.571 % Reduction in Area: 11.1% % Reduction in Volume: 11.1% Epithelialization: Large (67-100%) Tunneling: No Undermining: No Wound Description Classification: Full Thickness Without Exposed Support Structures Wound Margin: Distinct, outline attached Exudate Amount: Medium Exudate Type: Serosanguineous Exudate Color: red, brown Foul Odor After Cleansing: No Slough/Fibrino No Wound Bed Granulation Amount: Large (67-100%) Exposed Structure Granulation Quality: Red Fascia Exposed: No Necrotic Amount: None Present (0%) Fat Layer (Subcutaneous Tissue) Exposed: Yes Tendon Exposed: No Muscle Exposed: No Joint Exposed: No Bone Exposed: No Treatment Notes Wound #4 (Right, Lateral Lower Leg) 1. Cleanse With Wound Cleanser 3. Primary Dressing Applied Xeroform Gauze 4. Secondary Dressing Dry Gauze Roll Gauze 5. Secured  With Recruitment consultant) Signed: 03/15/2020 4:55:10 PM By: Baruch Gouty RN, BSN Entered By: Baruch Gouty on 03/15/2020 08:20:30 -------------------------------------------------------------------------------- Wound Assessment Details Patient Name: Date of Service: [name], [name] 03/15/2020 8:00 A M Medical Record Number: [id number] Patient Account Number: [id number] Date of Birth/Sex: Treating RN: 09-29-1931 (86 y.o. [name] Primary Care Floyed Masoud: Jan Fireman Other Clinician: Referring Roseana Rhine: Treating Brocha Gilliam/Extender: Edythe Lynn, [place] URA Weeks in Treatment: 12 Wound Status Wound Number: 5 Primary Trauma, Other Etiology: Wound Location: Left, Distal, Medial Lower Leg Secondary Venous Leg Ulcer Wounding Event: Trauma Etiology: Date Acquired: 03/04/2020 Wound Open Weeks Of Treatment: 1 Status: Clustered Wound: No Comorbid Coronary Artery Disease, Hypertension, Myocardial Infarction, History: Type II Diabetes, Osteoarthritis, Confinement Anxiety Wound Measurements Length: (cm) 0.1 Width: (cm) 0.1 Depth: (cm) 0.1 Area: (cm) 0.008 Volume: (cm) 0.001 % Reduction in Area: 99% % Reduction in Volume: 98.7% Epithelialization: Large (67-100%) Tunneling: No Undermining: No Wound Description Classification: Full Thickness Without Exposed Support Structures Wound Margin: Flat and Intact Exudate Amount: Small Exudate Type: Serosanguineous Exudate Color: red, brown Foul Odor After Cleansing: No Slough/Fibrino No Wound Bed Granulation Amount: None Present (0%) Exposed Structure Necrotic Amount: None Present (0%) Fascia Exposed: No Fat Layer (Subcutaneous Tissue) Exposed: No Tendon Exposed: No Muscle Exposed: No Joint Exposed: No Bone Exposed: No Limited to Skin Breakdown Treatment Notes Wound #5 (Left, Distal, Medial Lower Leg) 1. Cleanse With Wound Cleanser 3. Primary Dressing Applied Xeroform Gauze 4. Secondary Dressing Dry  Gauze Roll Gauze 5. Secured With Recruitment consultant) Signed: 03/15/2020 4:55:10 PM By: Baruch Gouty RN, BSN Entered By: Baruch Gouty on 03/15/2020 08:21:00 -------------------------------------------------------------------------------- Wound Assessment Details Patient Name: Date of Service: [name], [name] 03/15/2020 8:00 A M Medical Record Number: [id number] Patient Account Number: [id number] Date of Birth/Sex: Treating RN: October 03, 1931 (86 y.o. [name] Primary Care Ranjit Ashurst: Other Clinician: Jacalyn Lefevre, Pecola Leisure  URA Referring Aamari West: Treating Prisha Hiley/Extender: Edythe Lynn, [place] URA Weeks in Treatment: 12 Wound Status Wound Number: 6 Primary Trauma, Other Etiology: Wound Location: Left, Proximal, Medial Lower Leg Secondary Venous Leg Ulcer Wounding Event: Trauma Etiology: Date Acquired: 03/04/2020 Wound Open Weeks Of Treatment: 1 Status: Clustered Wound: No Comorbid Coronary Artery Disease, Hypertension, Myocardial Infarction, History: Type II Diabetes, Osteoarthritis, Confinement Anxiety Wound Measurements Length: (cm) 0.3 Width: (cm) 2.5 Depth: (cm) 0.1 Area: (cm) 0.589 Volume: (cm) 0.059 % Reduction in Area: 80% % Reduction in Volume: 80% Epithelialization: Large (67-100%) Tunneling: No Undermining: No Wound Description Classification: Full Thickness Without Exposed Support Structures Wound Margin: Flat and Intact Exudate Amount: Small Exudate Type: Serosanguineous Exudate Color: red, brown Foul Odor After Cleansing: No Slough/Fibrino No Wound Bed Granulation Amount: Medium (34-66%) Exposed Structure Granulation Quality: Red Fascia Exposed: No Necrotic Amount: None Present (0%) Fat Layer (Subcutaneous Tissue) Exposed: Yes Tendon Exposed: No Muscle Exposed: No Joint Exposed: No Bone Exposed: No Treatment Notes Wound #6 (Left, Proximal, Medial Lower Leg) 1. Cleanse With Wound Cleanser 3. Primary Dressing Applied Xeroform  Gauze 4. Secondary Dressing Dry Gauze Roll Gauze 5. Secured With Recruitment consultant) Signed: 03/15/2020 4:55:10 PM By: Baruch Gouty RN, BSN Entered By: Baruch Gouty on 03/15/2020 08:21:25 -------------------------------------------------------------------------------- Register Details Patient Name: Date of Service: [name], AMEDEE 03/15/2020 8:00 A M Medical Record Number: [id number] Patient Account Number: [id number] Date of Birth/Sex: Treating RN: 05-31-1932 (86 y.o. [name] Primary Care Kaleeyah Cuffie: Jan Fireman Other Clinician: Referring Rasheena Talmadge: Treating Magnum Lunde/Extender: Edythe Lynn, [place] URA Weeks in Treatment: 12 Vital Signs Time Taken: 08:07 Temperature (F): 98.8 Height (in): 67 Pulse (bpm): 67 Source: Stated Respiratory Rate (breaths/min): 18 Weight (lbs): 114 Blood Pressure (mmHg): 130/58 Source: Stated Reference Range: 80 - 120 mg / dl Body Mass Index (BMI): 17.9 Electronic Signature(s) Signed: 03/15/2020 4:55:10 PM By: Baruch Gouty RN, BSN Entered By: Baruch Gouty on 03/15/2020 08:07:54

## 2020-03-16 ENCOUNTER — Other Ambulatory Visit: Payer: Self-pay

## 2020-03-16 ENCOUNTER — Emergency Department (HOSPITAL_BASED_OUTPATIENT_CLINIC_OR_DEPARTMENT_OTHER): Payer: No Typology Code available for payment source

## 2020-03-16 ENCOUNTER — Encounter (HOSPITAL_BASED_OUTPATIENT_CLINIC_OR_DEPARTMENT_OTHER): Payer: Self-pay | Admitting: Emergency Medicine

## 2020-03-16 ENCOUNTER — Emergency Department (HOSPITAL_BASED_OUTPATIENT_CLINIC_OR_DEPARTMENT_OTHER)
Admission: EM | Admit: 2020-03-16 | Discharge: 2020-03-16 | Disposition: A | Discharge status: Home / Self Care | Payer: No Typology Code available for payment source | Attending: Emergency Medicine | Admitting: Emergency Medicine

## 2020-03-16 DIAGNOSIS — S20219A Contusion of unspecified front wall of thorax, initial encounter: Secondary | ICD-10-CM | POA: Diagnosis not present

## 2020-03-16 DIAGNOSIS — I2510 Atherosclerotic heart disease of native coronary artery without angina pectoris: Secondary | ICD-10-CM | POA: Diagnosis not present

## 2020-03-16 DIAGNOSIS — Y929 Unspecified place or not applicable: Secondary | ICD-10-CM | POA: Insufficient documentation

## 2020-03-16 DIAGNOSIS — S299XXA Unspecified injury of thorax, initial encounter: Secondary | ICD-10-CM | POA: Diagnosis present

## 2020-03-16 DIAGNOSIS — Z87891 Personal history of nicotine dependence: Secondary | ICD-10-CM | POA: Diagnosis not present

## 2020-03-16 DIAGNOSIS — Y939 Activity, unspecified: Secondary | ICD-10-CM | POA: Insufficient documentation

## 2020-03-16 DIAGNOSIS — E785 Hyperlipidemia, unspecified: Secondary | ICD-10-CM | POA: Insufficient documentation

## 2020-03-16 DIAGNOSIS — I10 Essential (primary) hypertension: Secondary | ICD-10-CM | POA: Insufficient documentation

## 2020-03-16 DIAGNOSIS — Y999 Unspecified external cause status: Secondary | ICD-10-CM | POA: Insufficient documentation

## 2020-03-16 DIAGNOSIS — Z9861 Coronary angioplasty status: Secondary | ICD-10-CM | POA: Insufficient documentation

## 2020-03-16 DIAGNOSIS — E119 Type 2 diabetes mellitus without complications: Secondary | ICD-10-CM | POA: Insufficient documentation

## 2020-03-16 DIAGNOSIS — R079 Chest pain, unspecified: Secondary | ICD-10-CM | POA: Insufficient documentation
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
## 2020-03-16 NOTE — ED Notes (Signed)
Dr. Little ED Provider at bedside  
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
## 2020-03-19 ENCOUNTER — Other Ambulatory Visit: Payer: Self-pay

## 2020-03-19 ENCOUNTER — Encounter: Payer: Self-pay | Admitting: Primary Care

## 2020-03-19 ENCOUNTER — Ambulatory Visit (INDEPENDENT_AMBULATORY_CARE_PROVIDER_SITE_OTHER): Payer: Medicare Other | Admitting: Primary Care

## 2020-03-19 ENCOUNTER — Ambulatory Visit (INDEPENDENT_AMBULATORY_CARE_PROVIDER_SITE_OTHER): Payer: Medicare Other

## 2020-03-19 VITALS — BP 118/62 | HR 57 | Temp 97.3°F | Ht 67.5 in | Wt 119.2 lb

## 2020-03-19 DIAGNOSIS — J90 Pleural effusion, not elsewhere classified: Secondary | ICD-10-CM | POA: Diagnosis not present

## 2020-03-19 IMAGING — DX DG CHEST 2V
2 series · 2 of 2 positions shown · non-contrast
Comparison: [DATE]

CLINICAL DATA: Pleural effusion, follow-up

EXAM:
CHEST - 2 VIEW

[chest pa]
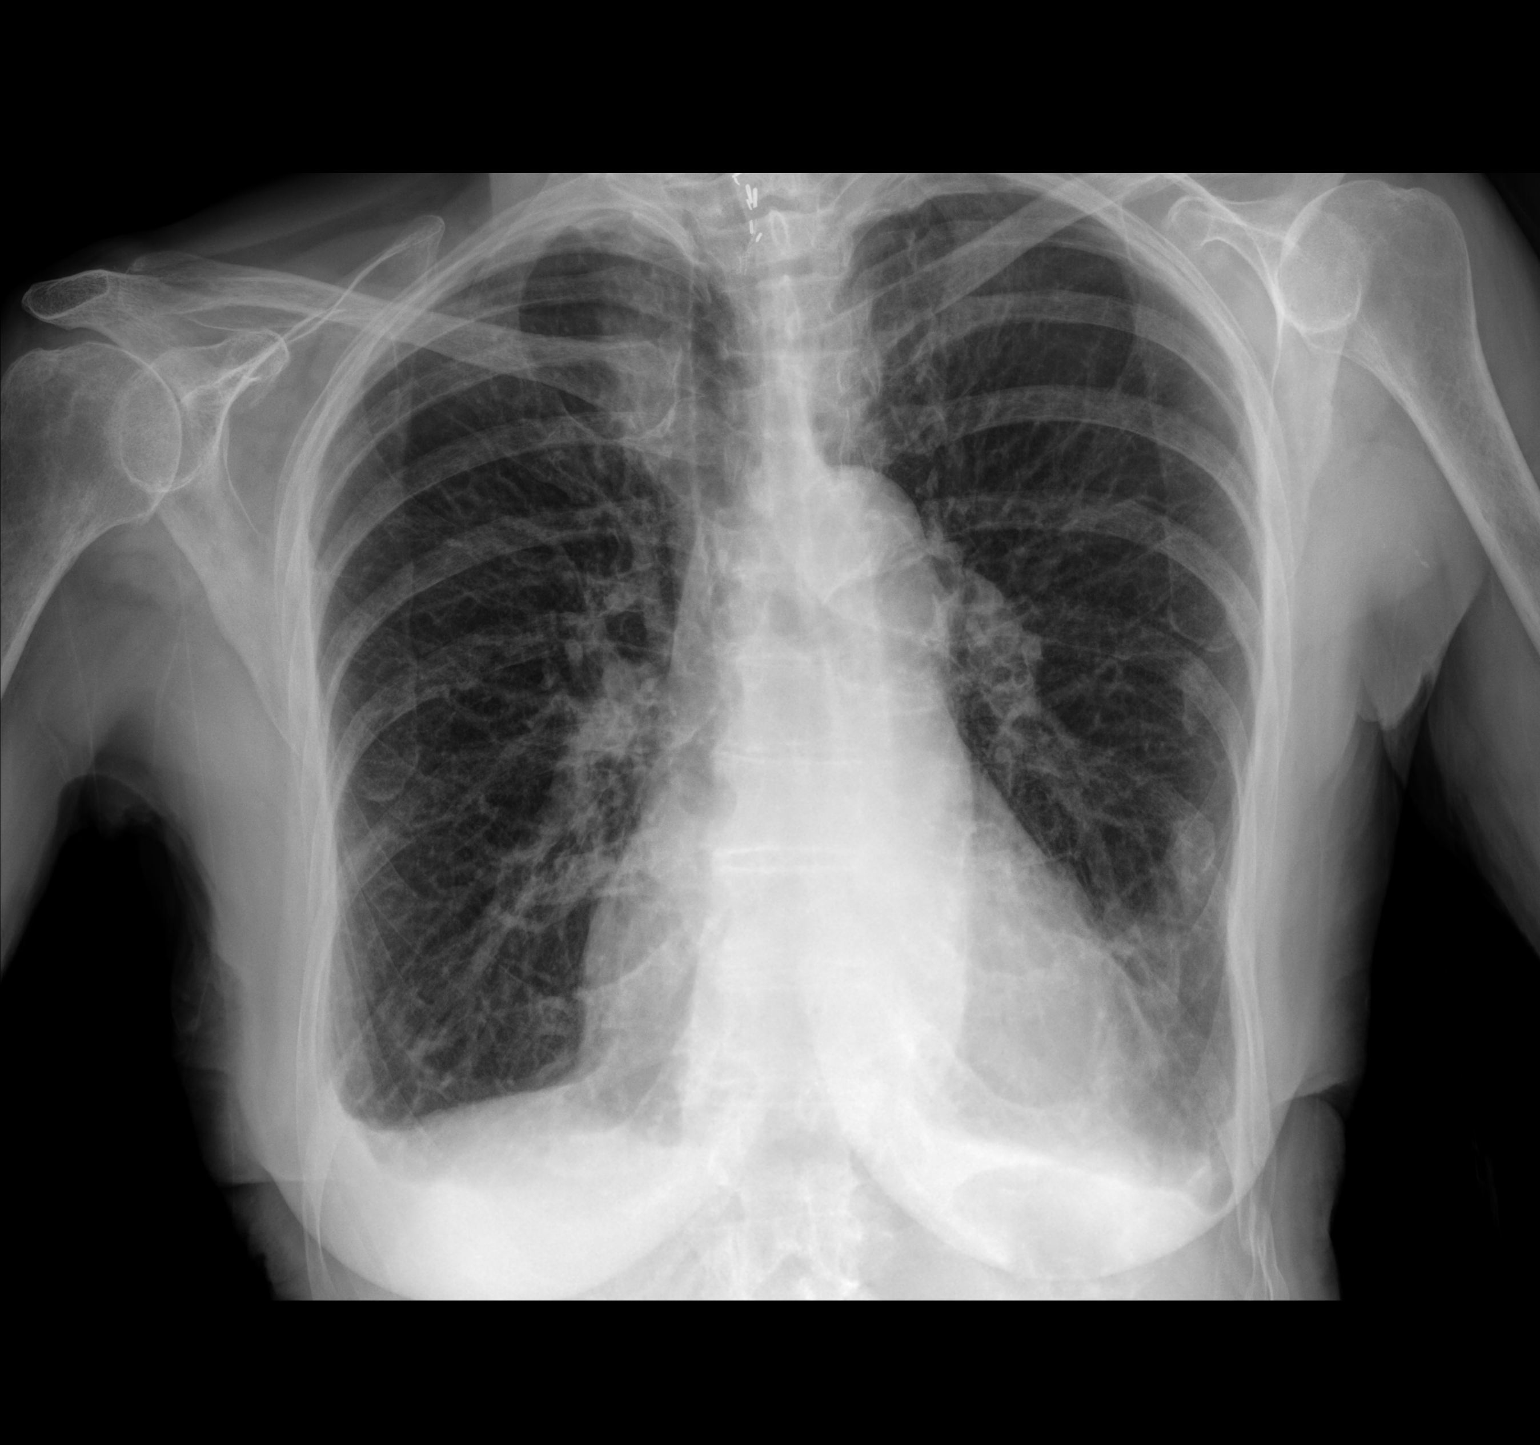

[chest lat]
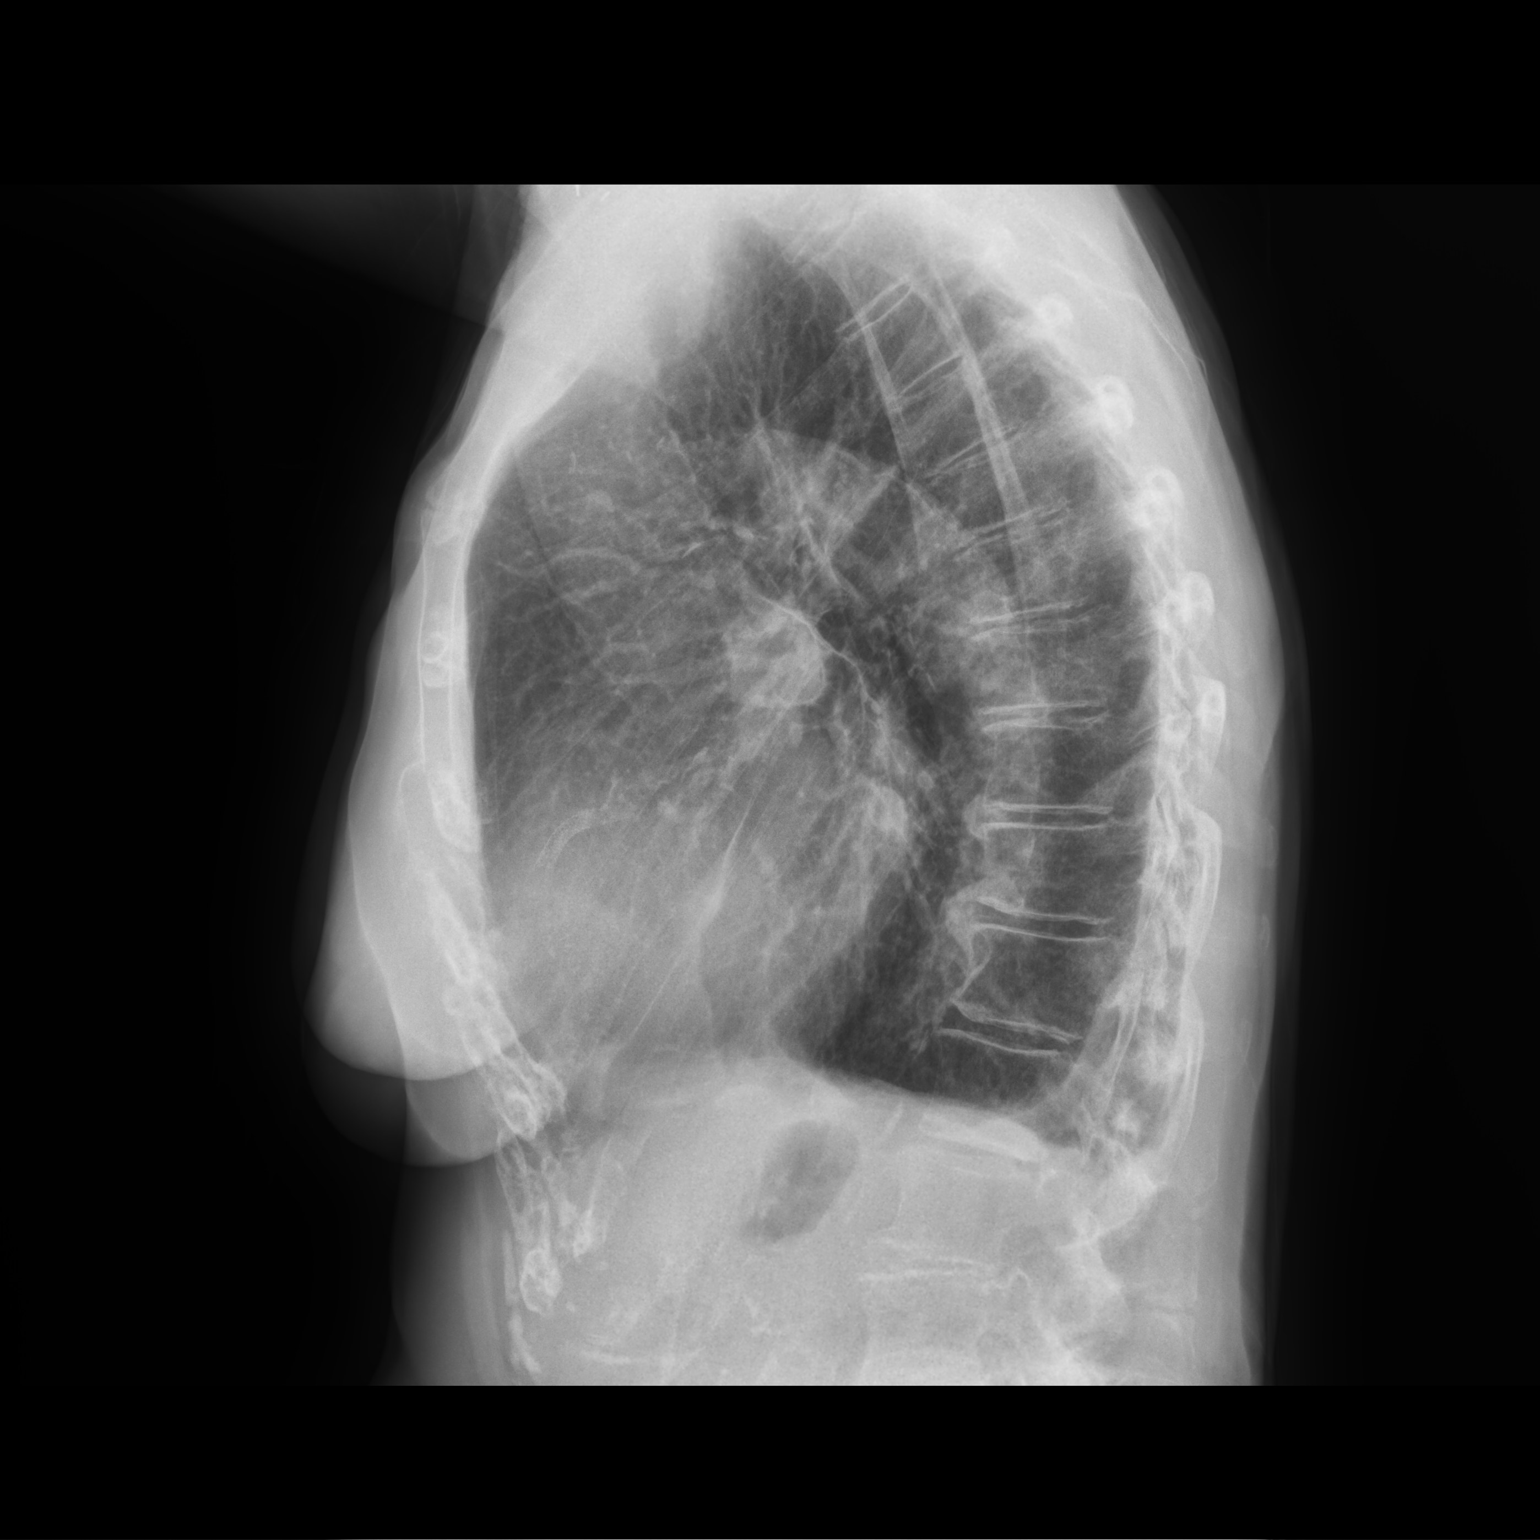

[2 of 2 positions shown; findings below may reference images not displayed]

FINDINGS: There is hyperinflation of the lungs compatible with COPD. Mild
cardiomegaly. Trace bilateral pleural effusions decreased on the
left since prior study, new on the right. No confluent airspace
opacities. No acute bony abnormality.
IMPRESSION: COPD, cardiomegaly. Trace bilateral effusions, decreasing on the
left, new on the right.

## 2020-03-19 NOTE — Patient Instructions (Signed)
Pleasure meeting you today [name]  CXR today showed trace pleural effusion, decreased in size on left Right side likely atelectasis   Encourage you take deep breaths a few times a hour with an incentive spirometer (you can find on on [employer])  I will run by concern about possible new sternal fracture with Dr. Tamala Julian- and get back to you on this if we need to work it up further  You can take Ibuprofen 400mg  every 6 hours (do not exceed 2gms in 24 hours) with food for right chest wall pain; alternate with Tylenol 600mg  every 6 hours (do not exceed 4 grams in 24 hours)  Follow-up: Pulmonary as needed if symptoms worsen; otherwise you can follow up with primary care
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
## 2020-03-22 ENCOUNTER — Encounter (HOSPITAL_BASED_OUTPATIENT_CLINIC_OR_DEPARTMENT_OTHER): Payer: Medicare Other | Attending: Internal Medicine | Admitting: Internal Medicine

## 2020-03-22 DIAGNOSIS — L97821 Non-pressure chronic ulcer of other part of left lower leg limited to breakdown of skin: Secondary | ICD-10-CM | POA: Insufficient documentation

## 2020-03-22 DIAGNOSIS — S8011XA Contusion of right lower leg, initial encounter: Secondary | ICD-10-CM | POA: Diagnosis not present

## 2020-03-22 DIAGNOSIS — I2510 Atherosclerotic heart disease of native coronary artery without angina pectoris: Secondary | ICD-10-CM | POA: Diagnosis not present

## 2020-03-22 DIAGNOSIS — W19XXXA Unspecified fall, initial encounter: Secondary | ICD-10-CM | POA: Diagnosis not present

## 2020-03-22 DIAGNOSIS — E11622 Type 2 diabetes mellitus with other skin ulcer: Secondary | ICD-10-CM | POA: Diagnosis not present

## 2020-03-22 DIAGNOSIS — M1990 Unspecified osteoarthritis, unspecified site: Secondary | ICD-10-CM | POA: Insufficient documentation

## 2020-03-22 DIAGNOSIS — Z8673 Personal history of transient ischemic attack (TIA), and cerebral infarction without residual deficits: Secondary | ICD-10-CM | POA: Diagnosis not present

## 2020-03-22 DIAGNOSIS — I10 Essential (primary) hypertension: Secondary | ICD-10-CM | POA: Diagnosis not present

## 2020-03-22 DIAGNOSIS — L97818 Non-pressure chronic ulcer of other part of right lower leg with other specified severity: Secondary | ICD-10-CM | POA: Diagnosis not present

## 2020-03-22 DIAGNOSIS — Z7901 Long term (current) use of anticoagulants: Secondary | ICD-10-CM | POA: Insufficient documentation

## 2020-03-22 DIAGNOSIS — I252 Old myocardial infarction: Secondary | ICD-10-CM | POA: Insufficient documentation

## 2020-03-22 NOTE — Progress Notes (Signed)
Karina, Pitts ([id number]) Visit Report for 03/22/2020 Debridement Details Patient Name: Date of Service: [name], [name] 03/22/2020 7:30 A M Medical Record Number: [id number] Patient Account Number: [id number] Date of Birth/Sex: Treating RN: 1931/10/30 (86 y.o. Hollie Salk, Shannon Primary Care Provider: Jan Fireman Other Clinician: Referring Provider: Treating Provider/Extender: Edythe Lynn, Bo Merino Weeks in Treatment: 13 Debridement Performed for Assessment: Wound #6 Left,Proximal,Medial Lower Leg Performed By: Physician Ricard Dillon., MD Debridement Type: Debridement Severity of Tissue Pre Debridement: Fat layer exposed Level of Consciousness (Pre-procedure): Awake and Alert Pre-procedure Verification/Time Out Yes - 08:19 Taken: Start Time: 08:19 Pain Control: Other : benzocaine, 20% T Area Debrided (L x W): otal 0.6 (cm) x 0.5 (cm) = 0.3 (cm) Tissue and other material debrided: Viable, Non-Viable, Slough, Skin: Dermis , Fibrin/Exudate, Slough Level: Skin/Dermis Debridement Description: Selective/Open Wound Instrument: Curette Bleeding: None End Time: 08:20 Procedural Pain: 0 Post Procedural Pain: 0 Response to Treatment: Procedure was tolerated well Level of Consciousness (Post- Awake and Alert procedure): Post Debridement Measurements of Total Wound Length: (cm) 0.6 Width: (cm) 0.5 Depth: (cm) 0.1 Volume: (cm) 0.024 Character of Wound/Ulcer Post Debridement: Requires Further Debridement Severity of Tissue Post Debridement: Fat layer exposed Post Procedure Diagnosis Same as Pre-procedure Electronic Signature(s) Signed: 03/22/2020 4:13:30 PM By: Kela Millin Signed: 03/22/2020 5:03:02 PM By: Linton Ham MD Entered By: Linton Ham on 03/22/2020 08:24:02 -------------------------------------------------------------------------------- HPI Details Patient Name: Date of Service: [name], [name] 03/22/2020 7:30 A M Medical Record Number:  [id number] Patient Account Number: [id number] Date of Birth/Sex: Treating RN: 07-15-1932 (86 y.o. Clearnce Sorrel Primary Care Provider: Jan Fireman Other Clinician: Referring Provider: Treating Provider/Extender: Edythe Lynn, Bo Merino Weeks in Treatment: 13 History of Present Illness HPI Description: 12/20/2019 patient presents today for initial evaluation here in our clinic concerning issues that she unfortunately has been having with skin tears over the right lower extremity. This happened secondary to a fall that she had initially in December causing some skin tears that for the most part healed and then she sustained a second fall in February 2021. Subsequently the patient did have skin tears which they have been trying to manage at this point and home health has not really been put anything on it as they are waiting for orders from [place]. As far as the daughter is concerned with the patient she has been using Vaseline over the area at this point to try to keep the area moist and nothing would stick to it. The patient is having discomfort unfortunately. Nonetheless I do not see any signs of active infection which is great news. She does have a history of diabetes mellitus type 2, hypertension, and is on anticoagulant therapy due to history of stroke. 12/27/2019 upon evaluation today patient appears to be doing well with regard to her wounds. They do appear to be somewhat dry however. She complains of the Xeroform having been sticking to the wound bed. Again not exactly sure why this would be the case at this point. Nonetheless there is no signs of infection I believe she may actually benefit from [facility] currently. 01/03/20 upon evaluation today patient actually appears to be doing well with regard to her wounds at this time. I see evidence of improvement at both locations and fortunately there is no evidence of active infection at this time. No fevers, chills, nausea, vomiting, or  diarrhea. 01/17/2020 upon evaluation today patient actually appears to be doing excellent in regard to her leg ulcer. This is actually healing  quite nicely and overall I am very pleased with the progress that is been made. There is no evidence of active infection at this time. 02/28/2020 upon evaluation today patient actually appears to be doing quite well with regard to her wounds. She shows no signs of actually had anything open on the elbow and her legs are still doing well. Fortunately I am very pleased with the overall appearance today. 03/08/2020; this is a patient I have not seen previously although she has been in the clinic twice before including recently on 6/9. She has chronic hemosiderin deposition likely related to venous insufficiency. Her daughter thinks that she was worked up for this when she lived in [place]. She does not use compression stockings or support stockings although she has them. When she was here 9 days ago she did not have anything open. Unfortunately she had a fall while stepping up from her porch into the house. She suffered skin tears quite extensively on the lateral right upper leg and on the left medial lower leg proximally and distally. These are all extensively Steri-Stripped. 6/25; patient had falls with skin tears on the right lateral upper leg and the left medial lower leg last week. Them and Steri-Stripped in urgent care or the ER. When we looked at this last week I did not remove the Steri-Strips. There was areas of dark skin that I thought were probably a 50-50 proposition of maintaining viability. Fortunately she has actually done exceptionally well. Both wound areas only have small areas that are open the rest of the skin is viable 7/2; the substantial area on the right lateral upper calf remains closed. Everything appears viable here. On the left medial lower leg still 2 tiny open areas. Slough covered. We have been using Xeroform and gauze Electronic  Signature(s) Signed: 03/22/2020 5:03:02 PM By: Linton Ham MD Entered By: Linton Ham on 03/22/2020 08:24:44 -------------------------------------------------------------------------------- Physical Exam Details Patient Name: Date of Service: [name], [name] 03/22/2020 7:30 A M Medical Record Number: [id number] Patient Account Number: [id number] Date of Birth/Sex: Treating RN: 1932-01-11 (86 y.o. Clearnce Sorrel Primary Care Provider: Jan Fireman Other Clinician: Referring Provider: Treating Provider/Extender: Edythe Lynn, [place] URA Weeks in Treatment: 13 Constitutional Sitting or standing Blood Pressure is within target range for patient.. Pulse regular and within target range for patient.Marland Kitchen Respirations regular, non-labored and within target range.. Temperature is normal and within the target range for the patient.Marland Kitchen Appears in no distress. Cardiovascular Pedal pulses palpable. Integumentary (Hair, Skin) Skin changes of hemosiderin deposition. Notes Wound exam Upper right lateral calf is totally closed. Left medial has 2 tiny open areas unfortunately they were slough covered with some nonviable skin flaking off. I removed this with a #3 curette to clean surfaces. There was no bleeding Electronic Signature(s) Signed: 03/22/2020 5:03:02 PM By: Linton Ham MD Entered By: Linton Ham on 03/22/2020 08:26:17 -------------------------------------------------------------------------------- Physician Orders Details Patient Name: Date of Service: DELANIA, FERG 03/22/2020 7:30 A M Medical Record Number: [id number] Patient Account Number: [id number] Date of Birth/Sex: Treating RN: December 06, 1931 (86 y.o. F) Dwiggins, Shannon Primary Care Provider: Jan Fireman Other Clinician: Referring Provider: Treating Provider/Extender: Edythe Lynn, Bo Merino Weeks in Treatment: 16 Verbal / Phone Orders: No Diagnosis Coding ICD-10 Coding Code Description L97.818  Non-pressure chronic ulcer of other part of right lower leg with other specified severity L97.828 Non-pressure chronic ulcer of other part of left lower leg with other specified severity E11.622 Type 2 diabetes mellitus with other skin ulcer Z79.01  Long term (current) use of anticoagulants S80.11XD Contusion of right lower leg, subsequent encounter Follow-up Appointments Return Appointment in 2 weeks. Dressing Change Frequency Wound #6 Left,Proximal,Medial Lower Leg Change Dressing every other day. Primary Wound Dressing Wound #6 Left,Proximal,Medial Lower Leg Xeroform Secondary Dressing Wound #6 Left,Proximal,Medial Lower Leg Kerlix/Rolled Gauze Dry Gauze Electronic Signature(s) Signed: 03/22/2020 4:13:30 PM By: Kela Millin Signed: 03/22/2020 5:03:02 PM By: Linton Ham MD Entered By: Kela Millin on 03/22/2020 08:21:38 -------------------------------------------------------------------------------- Problem List Details Patient Name: Date of Service: DARIUS, FILLINGIM 03/22/2020 7:30 A M Medical Record Number: [id number] Patient Account Number: [id number] Date of Birth/Sex: Treating RN: 1932-08-10 (86 y.o. Clearnce Sorrel Primary Care Provider: Jan Fireman Other Clinician: Referring Provider: Treating Provider/Extender: Edythe Lynn, Bo Merino Weeks in Treatment: 13 Active Problems ICD-10 Encounter Code Description Active Date MDM Diagnosis L97.828 Non-pressure chronic ulcer of other part of left lower leg with other specified 03/08/2020 No Yes severity E11.622 Type 2 diabetes mellitus with other skin ulcer 12/20/2019 No Yes Z79.01 Long term (current) use of anticoagulants 12/20/2019 No Yes S80.11XD Contusion of right lower leg, subsequent encounter 03/08/2020 No Yes Inactive Problems ICD-10 Code Description Active Date Inactive Date L97.822 Non-pressure chronic ulcer of other part of left lower leg with fat layer exposed 12/20/2019 12/20/2019 L97.818  Non-pressure chronic ulcer of other part of right lower leg with other specified severity 03/08/2020 03/08/2020 Resolved Problems ICD-10 Code Description Active Date Resolved Date S81.802A Unspecified open wound, left lower leg, initial encounter 12/20/2019 12/20/2019 Electronic Signature(s) Signed: 03/22/2020 5:03:02 PM By: Linton Ham MD Entered By: Linton Ham on 03/22/2020 08:23:45 -------------------------------------------------------------------------------- Progress Note Details Patient Name: Date of Service: [name], [name] 03/22/2020 7:30 A M Medical Record Number: [id number] Patient Account Number: [id number] Date of Birth/Sex: Treating RN: 20-Aug-1932 (86 y.o. Clearnce Sorrel Primary Care Provider: Jan Fireman Other Clinician: Referring Provider: Treating Provider/Extender: Edythe Lynn, [place] URA Weeks in Treatment: 13 Subjective History of Present Illness (HPI) 12/20/2019 patient presents today for initial evaluation here in our clinic concerning issues that she unfortunately has been having with skin tears over the right lower extremity. This happened secondary to a fall that she had initially in December causing some skin tears that for the most part healed and then she sustained a second fall in February 2021. Subsequently the patient did have skin tears which they have been trying to manage at this point and home health has not really been put anything on it as they are waiting for orders from [place]. As far as the daughter is concerned with the patient she has been using Vaseline over the area at this point to try to keep the area moist and nothing would stick to it. The patient is having discomfort unfortunately. Nonetheless I do not see any signs of active infection which is great news. She does have a history of diabetes mellitus type 2, hypertension, and is on anticoagulant therapy due to history of stroke. 12/27/2019 upon evaluation today patient appears to be  doing well with regard to her wounds. They do appear to be somewhat dry however. She complains of the Xeroform having been sticking to the wound bed. Again not exactly sure why this would be the case at this point. Nonetheless there is no signs of infection I believe she may actually benefit from [place] currently. 01/03/20 upon evaluation today patient actually appears to be doing well with regard to her wounds at this time. I see evidence of improvement at both locations and fortunately  there is no evidence of active infection at this time. No fevers, chills, nausea, vomiting, or diarrhea. 01/17/2020 upon evaluation today patient actually appears to be doing excellent in regard to her leg ulcer. This is actually healing quite nicely and overall I am very pleased with the progress that is been made. There is no evidence of active infection at this time. 02/28/2020 upon evaluation today patient actually appears to be doing quite well with regard to her wounds. She shows no signs of actually had anything open on the elbow and her legs are still doing well. Fortunately I am very pleased with the overall appearance today. 03/08/2020; this is a patient I have not seen previously although she has been in the clinic twice before including recently on 6/9. She has chronic hemosiderin deposition likely related to venous insufficiency. Her daughter thinks that she was worked up for this when she lived in [place]. She does not use compression stockings or support stockings although she has them. When she was here 9 days ago she did not have anything open. Unfortunately she had a fall while stepping up from her porch into the house. She suffered skin tears quite extensively on the lateral right upper leg and on the left medial lower leg proximally and distally. These are all extensively Steri-Stripped. 6/25; patient had falls with skin tears on the right lateral upper leg and the left medial lower leg last week. Them  and Steri-Stripped in urgent care or the ER. When we looked at this last week I did not remove the Steri-Strips. There was areas of dark skin that I thought were probably a 50-50 proposition of maintaining viability. Fortunately she has actually done exceptionally well. Both wound areas only have small areas that are open the rest of the skin is viable 7/2; the substantial area on the right lateral upper calf remains closed. Everything appears viable here. On the left medial lower leg still 2 tiny open areas. Slough covered. We have been using Xeroform and gauze Objective Constitutional Sitting or standing Blood Pressure is within target range for patient.. Pulse regular and within target range for patient.Marland Kitchen Respirations regular, non-labored and within target range.. Temperature is normal and within the target range for the patient.Marland Kitchen Appears in no distress. Vitals Time Taken: 7:54 AM, Height: 67 in, Weight: 114 lbs, BMI: 17.9, Temperature: 98.5 F, Pulse: 55 bpm, Respiratory Rate: 18 breaths/min, Blood Pressure: 136/72 mmHg. Cardiovascular Pedal pulses palpable. General Notes: Wound exam ooUpper right lateral calf is totally closed. ooLeft medial has 2 tiny open areas unfortunately they were slough covered with some nonviable skin flaking off. I removed this with a #3 curette to clean surfaces. There was no bleeding Integumentary (Hair, Skin) Skin changes of hemosiderin deposition. Wound #4 status is Healed - Epithelialized. Original cause of wound was Trauma. The wound is located on the Right,Lateral Lower Leg. The wound measures 0cm length x 0cm width x 0cm depth; 0cm^2 area and 0cm^3 volume. There is no tunneling or undermining noted. There is a none present amount of drainage noted. The wound margin is distinct with the outline attached to the wound base. There is no granulation within the wound bed. There is no necrotic tissue within the wound bed. Wound #5 status is Open. Original cause  of wound was Trauma. The wound is located on the Left,Distal,Medial Lower Leg. The wound measures 0cm length x 0cm width x 0cm depth; 0cm^2 area and 0cm^3 volume. The wound is limited to skin breakdown. There  is no tunneling or undermining noted. There is a small amount of serosanguineous drainage noted. The wound margin is flat and intact. There is no granulation within the wound bed. There is no necrotic tissue within the wound bed. Wound #6 status is Open. Original cause of wound was Trauma. The wound is located on the Left,Proximal,Medial Lower Leg. The wound measures 0.6cm length x 0.5cm width x 0.1cm depth; 0.236cm^2 area and 0.024cm^3 volume. There is Fat Layer (Subcutaneous Tissue) Exposed exposed. There is no tunneling or undermining noted. There is a small amount of serosanguineous drainage noted. The wound margin is flat and intact. There is medium (34-66%) red granulation within the wound bed. There is no necrotic tissue within the wound bed. Assessment Active Problems ICD-10 Non-pressure chronic ulcer of other part of left lower leg with other specified severity Type 2 diabetes mellitus with other skin ulcer Long term (current) use of anticoagulants Contusion of right lower leg, subsequent encounter Procedures Wound #6 Pre-procedure diagnosis of Wound #6 is a Trauma, Other located on the Left,Proximal,Medial Lower Leg .Severity of Tissue Pre Debridement is: Fat layer exposed. There was a Selective/Open Wound Skin/Dermis Debridement with a total area of 0.3 sq cm performed by Ricard Dillon., MD. With the following instrument(s): Curette to remove Viable and Non-Viable tissue/material. Material removed includes Slough, Skin: Dermis, and Fibrin/Exudate after achieving pain control using Other (benzocaine, 20%). No specimens were taken. A time out was conducted at 08:19, prior to the start of the procedure. There was no bleeding. The procedure was tolerated well with a pain level of  0 throughout and a pain level of 0 following the procedure. Post Debridement Measurements: 0.6cm length x 0.5cm width x 0.1cm depth; 0.024cm^3 volume. Character of Wound/Ulcer Post Debridement requires further debridement. Severity of Tissue Post Debridement is: Fat layer exposed. Post procedure Diagnosis Wound #6: Same as Pre-Procedure Plan Follow-up Appointments: Return Appointment in 2 weeks. Dressing Change Frequency: Wound #6 Left,Proximal,Medial Lower Leg: Change Dressing every other day. Primary Wound Dressing: Wound #6 Left,Proximal,Medial Lower Leg: Xeroform Secondary Dressing: Wound #6 Left,Proximal,Medial Lower Leg: Kerlix/Rolled Gauze Dry Gauze 1. Continue with Xeroform and gauze 2. Should be closed by the next time we see her in 2 weeks Electronic Signature(s) Signed: 03/22/2020 5:03:02 PM By: Linton Ham MD Entered By: Linton Ham on 03/22/2020 99:37:16 -------------------------------------------------------------------------------- SuperBill Details Patient Name: Date of Service: [name], [name] 03/22/2020 Medical Record Number: [id number] Patient Account Number: [id number] Date of Birth/Sex: Treating RN: 24-May-1932 (86 y.o. F) Dwiggins, Shannon Primary Care Provider: Jan Fireman Other Clinician: Referring Provider: Treating Provider/Extender: Edythe Lynn, [place] URA Weeks in Treatment: 13 Diagnosis Coding ICD-10 Codes Code Description L97.818 Non-pressure chronic ulcer of other part of right lower leg with other specified severity L97.828 Non-pressure chronic ulcer of other part of left lower leg with other specified severity E11.622 Type 2 diabetes mellitus with other skin ulcer Z79.01 Long term (current) use of anticoagulants S80.11XD Contusion of right lower leg, subsequent encounter Facility Procedures CPT4 Code: [id number] Description: [phone] - DEBRIDE WOUND 1ST 20 SQ CM OR < ICD-10 Diagnosis Description L97.828 Non-pressure chronic ulcer of  other part of left lower leg with other specified se Modifier: verity Quantity: 1 Physician Procedures : CPT4 Code Description Modifier [id number] - WC PHYS DEBR WO ANESTH 20 SQ CM ICD-10 Diagnosis Description L97.828 Non-pressure chronic ulcer of other part of left lower leg with other specified severity Quantity: 1 Electronic Signature(s) Signed: 03/22/2020 5:03:02 PM By: Linton Ham MD Entered By: Dellia Nims,  Legrand Como on 03/22/2020 08:26:58

## 2020-03-22 NOTE — Progress Notes (Addendum)
[name], [name] ([id number]) Visit Report for 03/22/2020 Arrival Information Details Patient Name: Date of Service: [name], [name] 03/22/2020 7:30 A M Medical Record Number: [id number] Patient Account Number: [id number] Date of Birth/Sex: Treating RN: [birth year] (86 y.o. Orvan Falconer Primary Care Abhijay Morriss: Jan Fireman Other Clinician: Referring Abbey Veith: Treating Beaumont Austad/Extender: Edythe Lynn, Bo Merino Weeks in Treatment: 13 Visit Information History Since Last Visit All ordered tests and consults were completed: No Patient Arrived: Ambulatory Added or deleted any medications: No Arrival Time: 07:54 Any new allergies or adverse reactions: No Accompanied By: daughter Had a fall or experienced change in No Transfer Assistance: None activities of daily living that may affect Patient Identification Verified: Yes risk of falls: Secondary Verification Process Completed: Yes Signs or symptoms of abuse/neglect since last visito No Patient Requires Transmission-Based Precautions: No Hospitalized since last visit: No Patient Has Alerts: Yes Implantable device outside of the clinic excluding No Patient Alerts: Patient on Blood Thinner cellular tissue based products placed in the center since last visit: Has Dressing in Place as Prescribed: Yes Pain Present Now: No Electronic Signature(s) Signed: 03/22/2020 3:42:07 PM By: Carlene Coria RN Entered By: Carlene Coria on 03/22/2020 07:54:38 -------------------------------------------------------------------------------- Encounter Discharge Information Details Patient Name: Date of Service: [name], [name] 03/22/2020 7:30 A M Medical Record Number: [id number] Patient Account Number: [id number] Date of Birth/Sex: Treating RN: 28-Sep-1931 (86 y.o. Nancy Fetter Primary Care Wanya Bangura: Jan Fireman Other Clinician: Referring Shade Kaley: Treating Carlicia Leavens/Extender: Edythe Lynn, Bo Merino Weeks in Treatment: 13 Encounter Discharge  Information Items Post Procedure Vitals Discharge Condition: Stable Temperature (F): 98.5 Ambulatory Status: Ambulatory Pulse (bpm): 55 Discharge Destination: Home Respiratory Rate (breaths/min): 18 Transportation: Private Auto Blood Pressure (mmHg): 136/72 Accompanied By: daughter Schedule Follow-up Appointment: Yes Clinical Summary of Care: Patient Declined Electronic Signature(s) Signed: 03/22/2020 4:42:40 PM By: Levan Hurst RN, BSN Entered By: Levan Hurst on 03/22/2020 09:07:01 -------------------------------------------------------------------------------- Lower Extremity Assessment Details Patient Name: Date of Service: [name], [name] 03/22/2020 7:30 A M Medical Record Number: [id number] Patient Account Number: [id number] Date of Birth/Sex: Treating RN: 1932/07/05 (86 y.o. Orvan Falconer Primary Care Reneisha Stilley: Jan Fireman Other Clinician: Referring Elvena Oyer: Treating Weaver Tweed/Extender: Edythe Lynn, [place] URA Weeks in Treatment: 13 Edema Assessment Assessed: [Left: No] [Right: No] Edema: [Left: No] [Right: No] Calf Left: Right: Point of Measurement: cm From Medial Instep 29 cm 29 cm Ankle Left: Right: Point of Measurement: cm From Medial Instep 17.5 cm 18 cm Electronic Signature(s) Signed: 03/22/2020 3:42:07 PM By: Carlene Coria RN Entered By: Carlene Coria on 03/22/2020 08:07:23 -------------------------------------------------------------------------------- Multi Wound Chart Details Patient Name: Date of Service: [name], [name] 03/22/2020 7:30 A M Medical Record Number: [id number] Patient Account Number: [id number] Date of Birth/Sex: Treating RN: [birth year] (86 y.o. F) Dwiggins, Shannon Primary Care Laurelin Elson: Jan Fireman Other Clinician: Referring Jeanie Mccard: Treating Lenita Peregrina/Extender: Edythe Lynn, [place] URA Weeks in Treatment: 13 Vital Signs Height(in): 26 Pulse(bpm): 55 Weight(lbs): 114 Blood Pressure(mmHg): 136/72 Body Mass Index(BMI):  18 Temperature(F): 98.5 Respiratory Rate(breaths/min): 18 Photos: [4:No Photos Right, Lateral Lower Leg] [5:No Photos Left, Distal, Medial Lower Leg] [6:No Photos Left, Proximal, Medial Lower Leg] Wound Location: [4:Trauma] [5:Trauma] [6:Trauma] Wounding Event: [4:Trauma, Other] [5:Trauma, Other] [6:Trauma, Other] Primary Etiology: [4:Venous Leg Ulcer] [5:Venous Leg Ulcer] [6:Venous Leg Ulcer] Secondary Etiology: [4:Coronary Artery Disease,] [5:Coronary Artery Disease,] [6:Coronary Artery Disease,] Comorbid History: [4:Hypertension, Myocardial Infarction, Type II Diabetes, Osteoarthritis, Confinement Anxiety 03/04/2020] [5:Hypertension, Myocardial Infarction, Type II Diabetes, Osteoarthritis, Confinement Anxiety 03/04/2020] [6:Hypertension,  Myocardial Infarction, Type II Diabetes, Osteoarthritis,  Confinement Anxiety 03/04/2020] Date Acquired: [4:2] [5:2] [6:2] Weeks of Treatment: [4:Healed - Epithelialized] [5:Open] [6:Open] Wound Status: [4:0x0x0] [5:0x0x0] [6:0.6x0.5x0.1] Measurements L x W x D (cm) [4:0] [5:0] [6:0.236] A (cm) : rea [4:0] [5:0] [6:0.024] Volume (cm) : [4:100.00%] [5:100.00%] [6:92.00%] % Reduction in Area: [4:100.00%] [5:100.00%] [6:91.90%] % Reduction in Volume: [4:Full Thickness Without Exposed] [5:Full Thickness Without Exposed] [6:Full Thickness Without Exposed] Classification: [4:Support Structures None Present] [5:Support Structures Small] [6:Support Structures Small] Exudate Amount: [4:N/A] [5:Serosanguineous] [6:Serosanguineous] Exudate Type: [4:N/A] [5:red, brown] [6:red, brown] Exudate Color: [4:Distinct, outline attached] [5:Flat and Intact] [6:Flat and Intact] Wound Margin: [4:None Present (0%)] [5:None Present (0%)] [6:Medium (34-66%)] Granulation Amount: [4:N/A] [5:N/A] [6:Red] Granulation Quality: [4:None Present (0%)] [5:None Present (0%)] [6:None Present (0%)] Necrotic Amount: [4:Fascia: No] [5:Fascia: No] [6:Fat Layer (Subcutaneous Tissue):  Yes] Exposed Structures: [4:Fat Layer (Subcutaneous Tissue): No Tendon: No Muscle: No Joint: No Bone: No Large (67-100%)] [5:Fat Layer (Subcutaneous Tissue): No Tendon: No Muscle: No Joint: No Bone: No Limited to Skin Breakdown Large (67-100%)] [6:Fascia: No Tendon: No Muscle:  No Joint: No Bone: No Large (67-100%)] Epithelialization: [4:N/A] [5:N/A] [6:Debridement - Selective/Open Wound] Debridement: [4:N/A] [5:N/A] [6:08:19] Pre-procedure Verification/Time Out Taken: [4:N/A] [5:N/A] [6:Other] Pain Control: [4:N/A] [5:N/A] [6:Slough] Tissue Debrided: [4:N/A] [5:N/A] [6:Skin/Dermis] Level: [4:N/A] [5:N/A] [6:0.3] Debridement A (sq cm): [4:rea N/A] [5:N/A] [6:Curette] Instrument: [4:N/A] [5:N/A] [6:None] Bleeding: [4:N/A] [5:N/A] [6:0] Procedural Pain: [4:N/A] [5:N/A] [6:0] Post Procedural Pain: [4:N/A] [5:N/A] [6:Procedure was tolerated well] Debridement Treatment Response: [4:N/A] [5:N/A] [6:0.6x0.5x0.1] Post Debridement Measurements L x W x D (cm) [4:N/A] [5:N/A] [6:0.024] Post Debridement Volume: (cm) [4:N/A] [5:N/A] [6:Debridement] Treatment Notes Electronic Signature(s) Signed: 03/22/2020 4:13:30 PM By: Kela Millin Signed: 03/22/2020 5:03:02 PM By: Linton Ham MD Entered By: Linton Ham on 03/22/2020 08:23:52 -------------------------------------------------------------------------------- Multi-Disciplinary Care Plan Details Patient Name: Date of Service: [name], [name] 03/22/2020 7:30 A M Medical Record Number: [id number] Patient Account Number: [id number] Date of Birth/Sex: Treating RN: 05/02/1932 (86 y.o. Clearnce Sorrel Primary Care Julina Altmann: Jan Fireman Other Clinician: Referring Dotti Busey: Treating Denorris Reust/Extender: Edythe [place], [place] URA Weeks in Treatment: 13 Active Inactive Electronic Signature(s) Signed: 06/10/2020 1:34:18 PM By: Kela Millin Signed: 06/11/2020 7:59:16 AM By: Levan Hurst RN, BSN Previous Signature: 03/22/2020 4:13:30  PM Version By: Kela Millin Entered By: Levan Hurst on 04/08/2020 08:04:13 -------------------------------------------------------------------------------- Pain Assessment Details Patient Name: Date of Service: [name], [name] 03/22/2020 7:30 A M Medical Record Number: [id number] Patient Account Number: [id number] Date of Birth/Sex: Treating RN: 17-Oct-1931 (86 y.o. Orvan Falconer Primary Care Jack Bolio: Other Clinician: Jan Fireman Referring Katana Berthold: Treating Shakedra Beam/Extender: Edythe Lynn, [place] URA Weeks in Treatment: 13 Active Problems Location of Pain Severity and Description of Pain Patient Has Paino No Site Locations Pain Management and Medication Current Pain Management: Electronic Signature(s) Signed: 03/22/2020 3:42:07 PM By: Carlene Coria RN Entered By: Carlene Coria on 03/22/2020 07:55:04 -------------------------------------------------------------------------------- Patient/Caregiver Education Details Patient Name: Date of Service: [name] 7/2/2021andnbsp7:30 [address] Record Number: [id number] Patient Account Number: [id number] Date of Birth/Gender: Treating RN: 07-14-1932 (86 y.o. Clearnce Sorrel Primary Care Physician: Jan Fireman Other Clinician: Referring Physician: Treating Physician/Extender: Harless Litten in Treatment: 13 Education Assessment Education Provided To: Patient Education Topics Provided Elevated Blood Sugar/ Impact on Healing: Handouts: Elevated Blood Sugars: How Do They Affect Wound Healing Methods: Explain/Verbal Responses: State content correctly Pain: Handouts: A Guide to Pain Control Methods: Explain/Verbal Responses: State content correctly Electronic Signature(s) Signed: 03/22/2020 4:13:30 PM By: Kela Millin Signed: 03/22/2020 4:13:30 PM  By: Kela Millin Entered By: Kela Millin on 03/22/2020  07:56:23 -------------------------------------------------------------------------------- Wound Assessment Details Patient Name: Date of Service: [name], [name] 03/22/2020 7:30 A M Medical Record Number: [id number] Patient Account Number: [id number] Date of Birth/Sex: Treating RN: 02-Dec-1931 (86 y.o. F) Dwiggins, Shannon Primary Care Sereena Marando: Jan Fireman Other Clinician: Referring Icey Tello: Treating Dannette Kinkaid/Extender: Edythe [place], [place] URA Weeks in Treatment: 13 Wound Status Wound Number: 4 Primary Trauma, Other Etiology: Wound Location: Right, Lateral Lower Leg Secondary Venous Leg Ulcer Wounding Event: Trauma Etiology: Date Acquired: 03/04/2020 Wound Healed - Epithelialized Weeks Of Treatment: 2 Status: Clustered Wound: No Comorbid Coronary Artery Disease, Hypertension, Myocardial Infarction, History: Type II Diabetes, Osteoarthritis, Confinement Anxiety Photos Photo Uploaded By: Mikeal Hawthorne on 03/26/2020 13:24:19 Wound Measurements Length: (cm) Width: (cm) Depth: (cm) Area: (cm) Volume: (cm) 0 % Reduction in Area: 100% 0 % Reduction in Volume: 100% 0 Epithelialization: Large (67-100%) 0 Tunneling: No 0 Undermining: No Wound Description Classification: Full Thickness Without Exposed Support Structures Wound Margin: Distinct, outline attached Exudate Amount: None Present Foul Odor After Cleansing: No Slough/Fibrino No Wound Bed Granulation Amount: None Present (0%) Exposed Structure Necrotic Amount: None Present (0%) Fascia Exposed: No Fat Layer (Subcutaneous Tissue) Exposed: No Tendon Exposed: No Muscle Exposed: No Joint Exposed: No Bone Exposed: No Electronic Signature(s) Signed: 03/22/2020 4:13:30 PM By: Kela Millin Entered By: Kela Millin on 03/22/2020 08:19:11 -------------------------------------------------------------------------------- Wound Assessment Details Patient Name: Date of Service: [name], [name] 03/22/2020 7:30  A M Medical Record Number: [id number] Patient Account Number: [id number] Date of Birth/Sex: Treating RN: 1932-08-02 (86 y.o. Orvan Falconer Primary Care Rosmary Dionisio: Jan Fireman Other Clinician: Referring Edell Mesenbrink: Treating Jing Howatt/Extender: Edythe [place], [place] URA Weeks in Treatment: 13 Wound Status Wound Number: 5 Primary Trauma, Other Etiology: Wound Location: Left, Distal, Medial Lower Leg Secondary Venous Leg Ulcer Wounding Event: Trauma Etiology: Date Acquired: 03/04/2020 Wound Open Weeks Of Treatment: 2 Status: Clustered Wound: No Comorbid Coronary Artery Disease, Hypertension, Myocardial Infarction, History: Type II Diabetes, Osteoarthritis, Confinement Anxiety Photos Photo Uploaded By: Mikeal Hawthorne on 03/26/2020 13:24:51 Wound Measurements Length: (cm) Width: (cm) Depth: (cm) Area: (cm) Volume: (cm) 0 % Reduction in Area: 100% 0 % Reduction in Volume: 100% 0 Epithelialization: Large (67-100%) 0 Tunneling: No 0 Undermining: No Wound Description Classification: Full Thickness Without Exposed Support Structures Wound Margin: Flat and Intact Exudate Amount: Small Exudate Type: Serosanguineous Exudate Color: red, brown Foul Odor After Cleansing: No Slough/Fibrino No Wound Bed Granulation Amount: None Present (0%) Exposed Structure Necrotic Amount: None Present (0%) Fascia Exposed: No Fat Layer (Subcutaneous Tissue) Exposed: No Tendon Exposed: No Muscle Exposed: No Joint Exposed: No Bone Exposed: No Limited to Skin Breakdown Electronic Signature(s) Signed: 03/22/2020 3:42:07 PM By: Carlene Coria RN Entered By: Carlene Coria on 03/22/2020 08:09:21 -------------------------------------------------------------------------------- Wound Assessment Details Patient Name: Date of Service: [name], [name] 03/22/2020 7:30 A M Medical Record Number: [id number] Patient Account Number: [id number] Date of Birth/Sex: Treating RN: 1931-12-12 (86 y.o. Orvan Falconer Primary Care Math Brazie: Jan Fireman Other Clinician: Referring Symon Norwood: Treating Nalanie Winiecki/Extender: Edythe Lynn, [place] URA Weeks in Treatment: 13 Wound Status Wound Number: 6 Primary Trauma, Other Etiology: Wound Location: Left, Proximal, Medial Lower Leg Secondary Venous Leg Ulcer Wounding Event: Trauma Etiology: Date Acquired: 03/04/2020 Wound Open Weeks Of Treatment: 2 Status: Clustered Wound: No Comorbid Coronary Artery Disease, Hypertension, Myocardial Infarction, History: Type II Diabetes, Osteoarthritis, Confinement Anxiety Photos Photo Uploaded By: Mikeal Hawthorne on 03/26/2020 13:24:20 Wound Measurements Length: (cm) 0.6 Width: (cm) 0.5 Depth: (cm)  0.1 Area: (cm) 0.236 Volume: (cm) 0.024 % Reduction in Area: 92% % Reduction in Volume: 91.9% Epithelialization: Large (67-100%) Tunneling: No Undermining: No Wound Description Classification: Full Thickness Without Exposed Support Structures Wound Margin: Flat and Intact Exudate Amount: Small Exudate Type: Serosanguineous Exudate Color: red, brown Foul Odor After Cleansing: No Slough/Fibrino No Wound Bed Granulation Amount: Medium (34-66%) Exposed Structure Granulation Quality: Red Fascia Exposed: No Necrotic Amount: None Present (0%) Fat Layer (Subcutaneous Tissue) Exposed: Yes Tendon Exposed: No Muscle Exposed: No Joint Exposed: No Bone Exposed: No Electronic Signature(s) Signed: 03/22/2020 3:42:07 PM By: Carlene Coria RN Entered By: Carlene Coria on 03/22/2020 08:09:33 -------------------------------------------------------------------------------- Vitals Details Patient Name: Date of Service: [name], [name] 03/22/2020 7:30 A M Medical Record Number: [id number] Patient Account Number: [id number] Date of Birth/Sex: Treating RN: 24-Nov-1931 (86 y.o. Orvan Falconer Primary Care Jaran Sainz: Jan Fireman Other Clinician: Referring Casia Corti: Treating Markelle Najarian/Extender: Edythe Lynn,  [place] URA Weeks in Treatment: 13 Vital Signs Time Taken: 07:54 Temperature (F): 98.5 Height (in): 67 Pulse (bpm): 55 Weight (lbs): 114 Respiratory Rate (breaths/min): 18 Body Mass Index (BMI): 17.9 Blood Pressure (mmHg): 136/72 Reference Range: 80 - 120 mg / dl Electronic Signature(s) Signed: 03/22/2020 3:42:07 PM By: Carlene Coria RN Entered By: Carlene Coria on 03/22/2020 07:54:58

## 2020-03-24 NOTE — Progress Notes (Signed)
PCCM: thanks for seeing Karina Nash, DO [facility] 03/24/2020 10:45 AM

## 2020-04-08 ENCOUNTER — Encounter (HOSPITAL_BASED_OUTPATIENT_CLINIC_OR_DEPARTMENT_OTHER): Payer: Medicare Other | Admitting: Internal Medicine

## 2020-04-18 ENCOUNTER — Telehealth: Payer: Self-pay | Admitting: *Deleted

## 2020-04-18 NOTE — Telephone Encounter (Signed)
A detailed message was left, re: follow up visit.

## 2020-05-16 ENCOUNTER — Ambulatory Visit (HOSPITAL_COMMUNITY)
Admission: RE | Admit: 2020-05-16 | Discharge: 2020-05-16 | Disposition: A | Discharge status: Home / Self Care | Payer: Medicare Other | Source: Ambulatory Visit | Attending: Internal Medicine | Admitting: Internal Medicine

## 2020-05-16 ENCOUNTER — Other Ambulatory Visit: Payer: Self-pay

## 2020-05-16 ENCOUNTER — Other Ambulatory Visit (HOSPITAL_COMMUNITY): Payer: Self-pay | Admitting: Internal Medicine

## 2020-05-16 DIAGNOSIS — R410 Disorientation, unspecified: Secondary | ICD-10-CM | POA: Diagnosis present

## 2020-05-16 DIAGNOSIS — R413 Other amnesia: Secondary | ICD-10-CM | POA: Diagnosis not present

## 2020-05-16 IMAGING — MR MR HEAD W/O CM
7 of 11 series · 25 of 48 positions shown · non-contrast
Comparison: None.

CLINICAL DATA: Memory loss and confusion

EXAM:
MRI HEAD WITHOUT CONTRAST
TECHNIQUE: Multiplanar, multiecho pulse sequences of the brain and surrounding
structures were obtained without intravenous contrast.

[Series 2: DWI · axial · 3.0mm · 0.94mm/px · z∈[-79,+66]mm · 7 of 100 slices shown (1 of 2)]
[im 1/100]
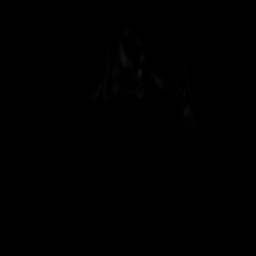
[im 17/100]
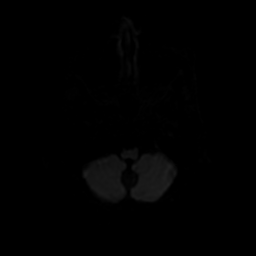
[im 34/100]
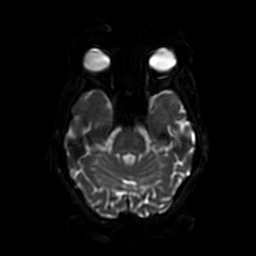
[im 50/100]
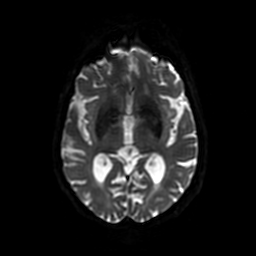
[im 67/100]
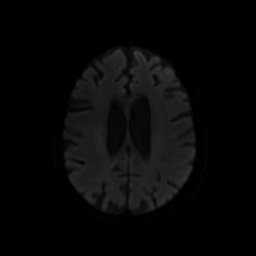
[im 83/100]
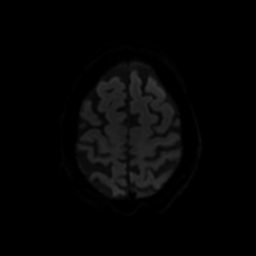
[im 100/100]
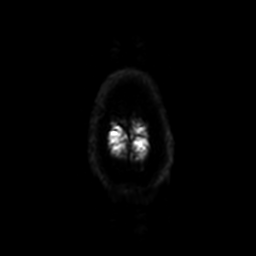

[Series 3: DWI · coronal · 4.0mm · 0.94mm/px · 6 of 74 slices shown (2 of 2)]
[im 1/74]
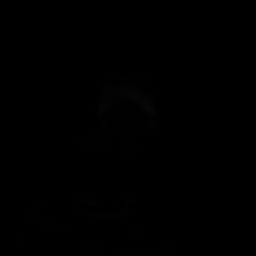
[im 15/74]
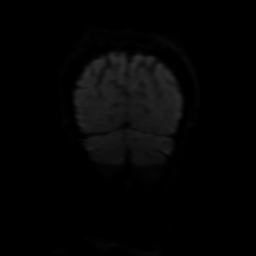
[im 30/74]
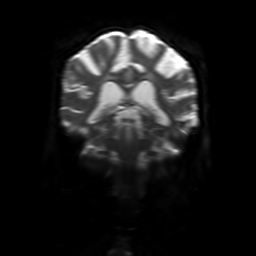
[im 44/74]
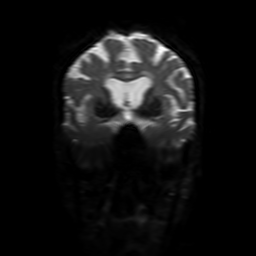
[im 59/74]
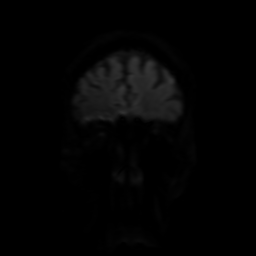
[im 74/74]
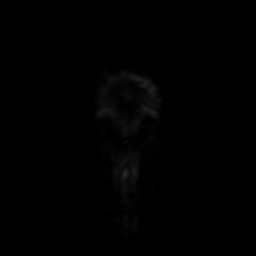

[Series 4: FLAIR · sagittal · 5.0mm · 0.23mm/px · 2 of 23 slices shown (1 of 2)]
[im 1/23]
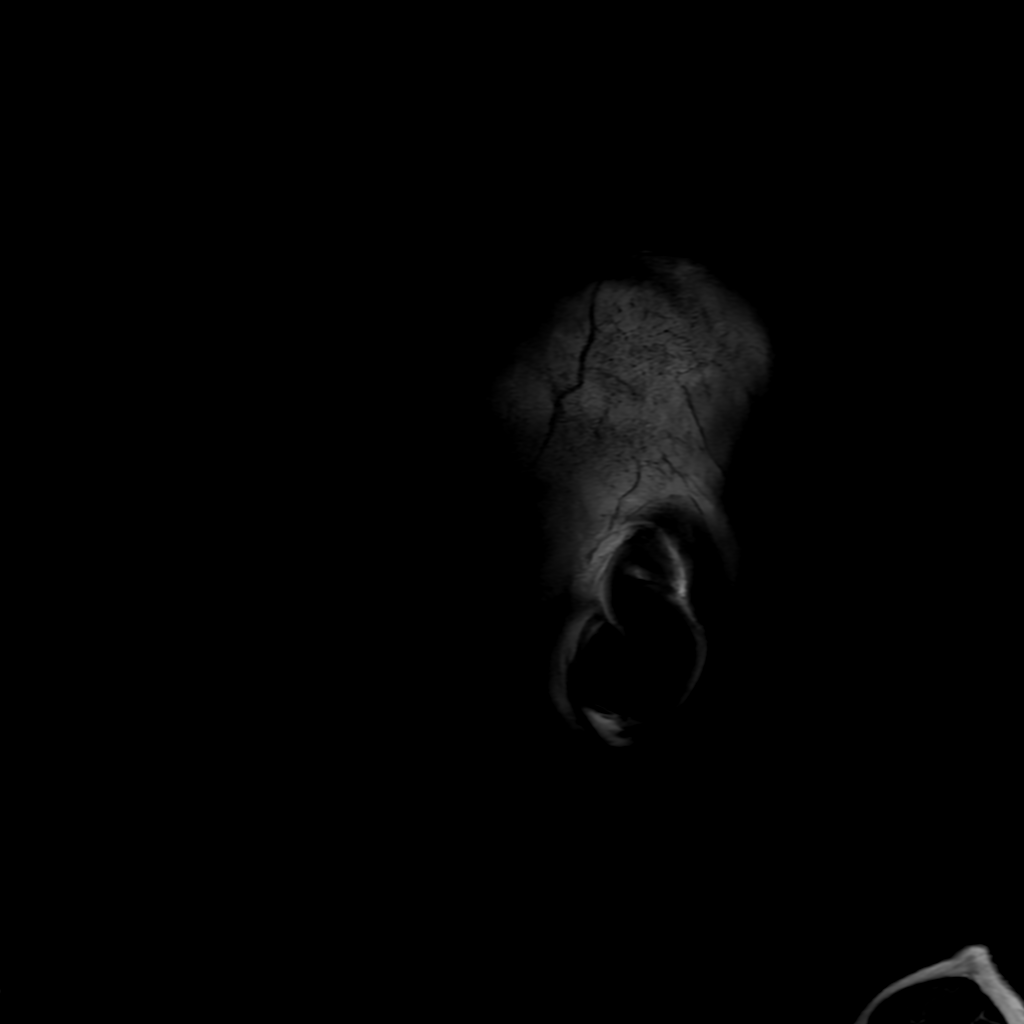
[im 23/23]
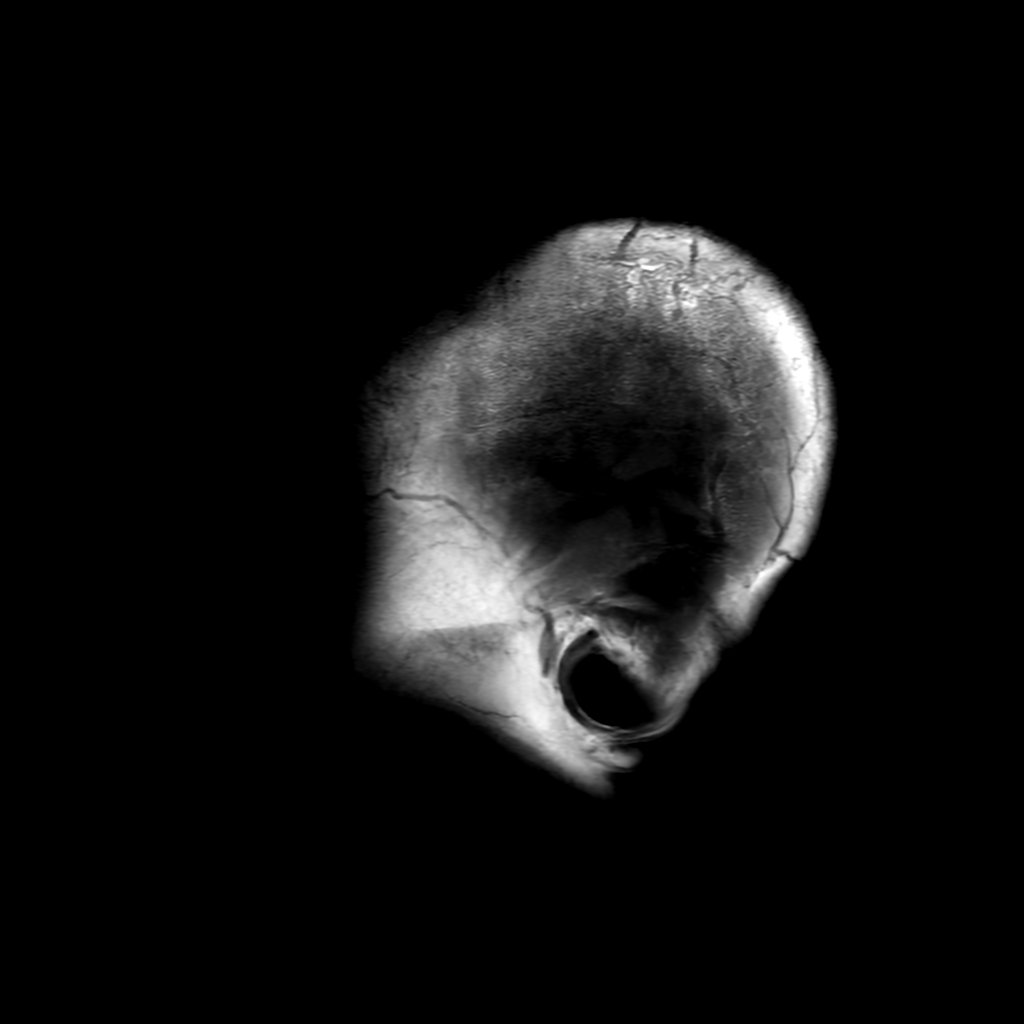

[Series 5: T2 · axial · 5.0mm · 0.23mm/px · 1 of 26 slices shown]
[im 1/26]
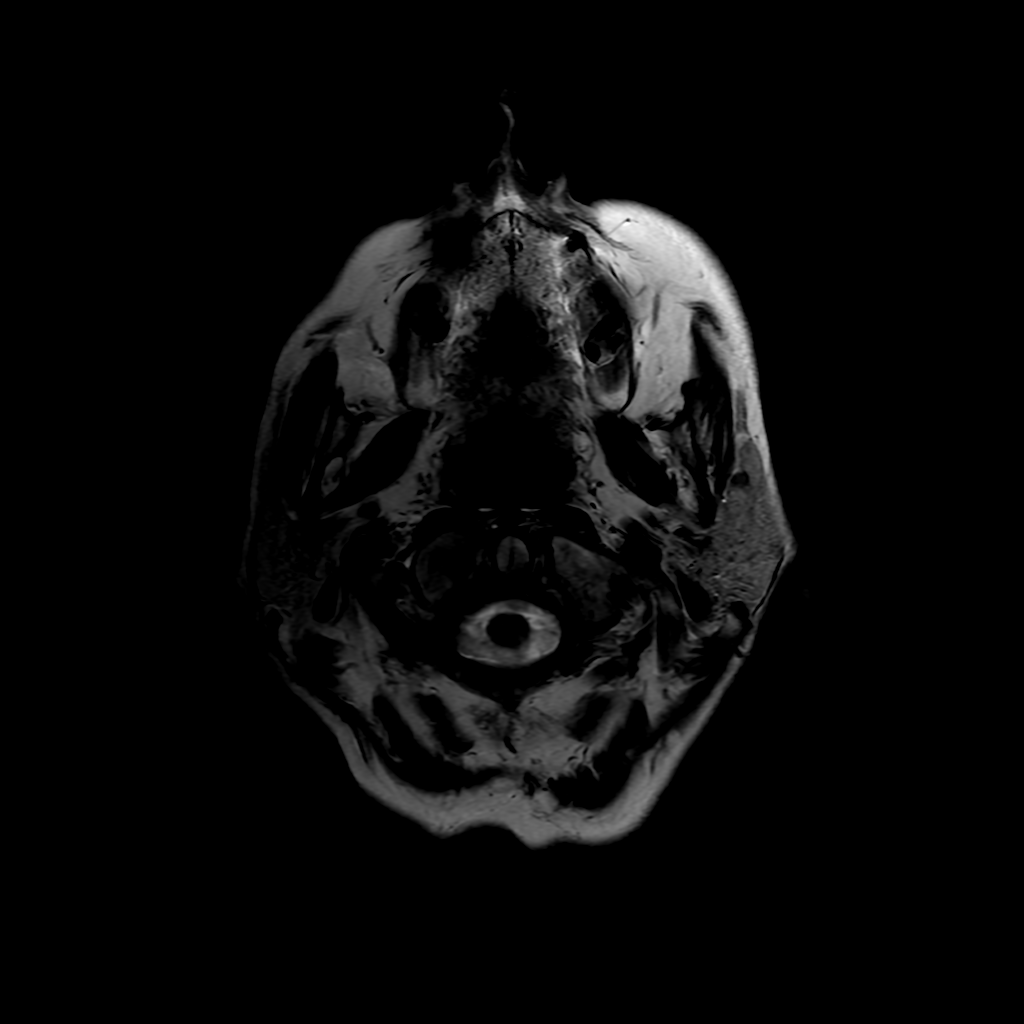

[Series 6: FLAIR · axial · 3.0mm · 0.41mm/px · z∈[-74,+68]mm · 2 of 25 slices shown (2 of 2)]
[im 1/25]
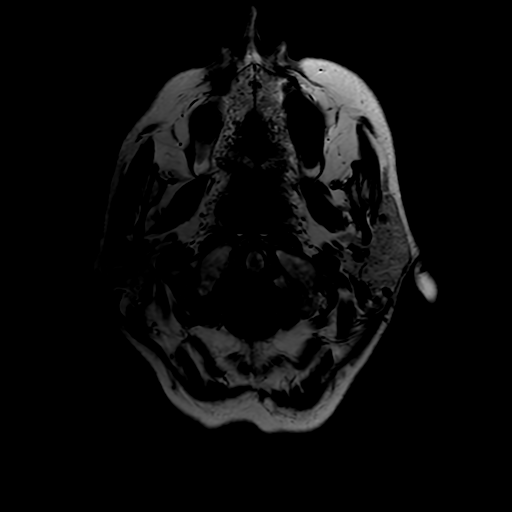
[im 25/25]
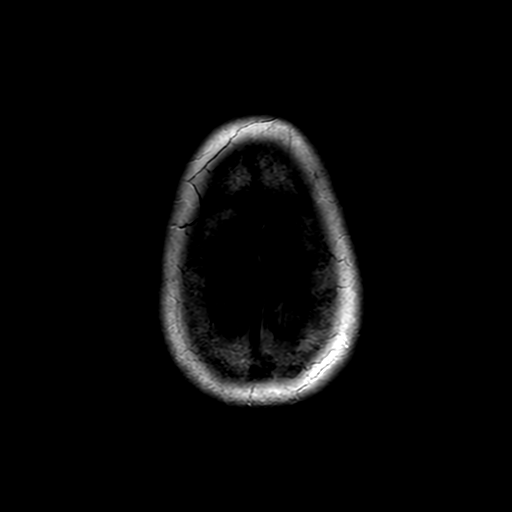

[Series 250: ADC · axial · 3.0mm · 0.94mm/px · z∈[-79,+66]mm · 4 of 50 slices shown (1 of 2)]
[im 1/50]
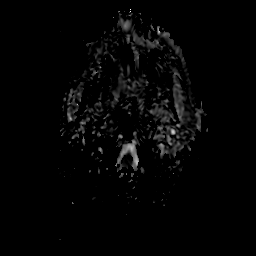
[im 17/50]
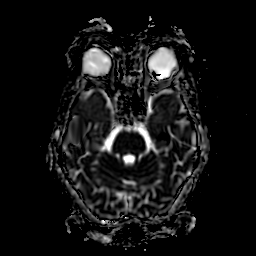
[im 33/50]
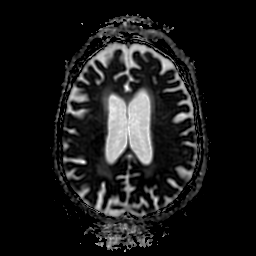
[im 50/50]
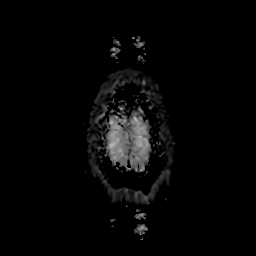

[Series 350: ADC · coronal · 4.0mm · 0.94mm/px · 3 of 36 slices shown (2 of 2)]
[im 1/36]
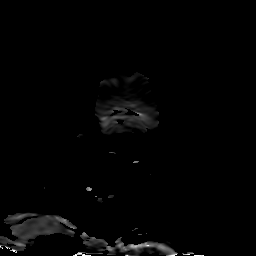
[im 18/36]
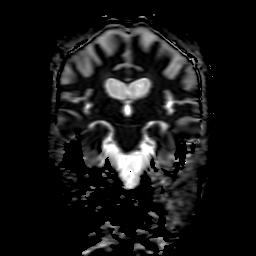
[im 36/36]
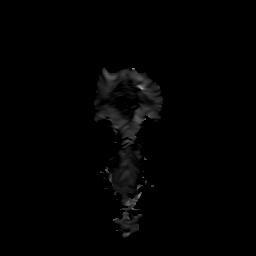

[25 of 48 positions shown; findings below may reference images not displayed]

FINDINGS: Brain: There is no acute infarction or intracranial hemorrhage.
There is no intracranial mass, mass effect, or edema. There is no
hydrocephalus or extra-axial fluid collection.

Prominence of the ventricles and sulci reflects generalized
parenchymal volume loss. Patchy and confluent areas of T2
hyperintensity in the supratentorial and pontine white matter are
nonspecific but may reflect mild to moderate chronic microvascular
ischemic changes.

Vascular: Major vessel flow voids at the skull base are preserved.

Skull and upper cervical spine: Normal marrow signal is preserved.

Sinuses/Orbits: Minor mucosal thickening. No significant orbital
finding.

Other: Sella is unremarkable.  Mastoid air cells are clear.
IMPRESSION: No evidence of recent infarction, hemorrhage, or mass. Parenchymal
volume loss and chronic microvascular ischemic changes.

## 2020-06-24 ENCOUNTER — Telehealth: Payer: Self-pay | Admitting: *Deleted

## 2020-06-24 ENCOUNTER — Encounter: Payer: Self-pay | Admitting: Neurology

## 2020-06-24 ENCOUNTER — Other Ambulatory Visit: Payer: Self-pay

## 2020-06-24 ENCOUNTER — Ambulatory Visit (INDEPENDENT_AMBULATORY_CARE_PROVIDER_SITE_OTHER): Payer: Medicare Other | Admitting: Neurology

## 2020-06-24 VITALS — BP 104/51 | HR 52 | Ht 67.5 in | Wt 115.5 lb

## 2020-06-24 DIAGNOSIS — R410 Disorientation, unspecified: Secondary | ICD-10-CM | POA: Diagnosis not present

## 2020-06-24 DIAGNOSIS — Z818 Family history of other mental and behavioral disorders: Secondary | ICD-10-CM | POA: Diagnosis not present

## 2020-06-24 DIAGNOSIS — F0390 Unspecified dementia without behavioral disturbance: Secondary | ICD-10-CM

## 2020-06-24 DIAGNOSIS — I6523 Occlusion and stenosis of bilateral carotid arteries: Secondary | ICD-10-CM | POA: Diagnosis not present

## 2020-06-24 MED ORDER — DONEPEZIL HCL 10 MG PO TABS: 5.0000 mg | ORAL_TABLET | Freq: Every day | ORAL | 3 refills | Status: DC

## 2020-06-24 NOTE — Telephone Encounter (Signed)
Called and LVM for office of Liam Graham (PCP) requesting lab results from 05/15/20 be faxed to our office at [phone] attn Dr. Rexene Alberts.

## 2020-06-24 NOTE — Patient Instructions (Addendum)
You have complaints of memory loss: memory loss or changes in cognitive function can have many reasons and does not always mean you have dementia. Conditions that can contribute to subjective or objective memory loss include: depression, stress, poor sleep from insomnia or sleep apnea, dehydration, fluctuation in blood sugar values, thyroid or electrolyte dysfunction and certain vitamin deficiencies. Dementia can be caused by stroke, brain atherosclerosis or brain vascular disease due to vascular risk factors (smoking, high blood pressure, high cholesterol, obesity and uncontrolled diabetes), certain degenerative brain disorders (including Parkinson's disease and Multiple sclerosis) and by Alzheimer's disease or other, more rare and sometimes hereditary causes.  You have had recent blood work through Dr. Jacalyn Lefevre, but I would like to add a few more tests today.  You also recently had a brain scan.   We will start a new medication called Aricept (generic name: donepezil) 10 mg: take 1/2 pill each evening. Common side effects may include dry eyes, dry mouth, confusion, low pulse, low blood pressure, also GI related side effects (nausea, vomiting, diarrhea, constipation), headaches; rare side effects may include hallucinations and seizures.   Please follow-up to see one of our nurse practitioners in 3 months.  We will try to increase your Aricept at the time to 1 pill daily if tolerated.

## 2020-06-24 NOTE — Progress Notes (Signed)
Subjective:    Patient ID: [name] is a 86 y.o. female.  HPI     Karina Age, MD, PhD [facility] [address], [address] [place], [place] [address]  Dear Dr. Jacalyn Pitts,   I saw your patient, [name], upon your kind request, in my Neurologic clinic today for initial consultation of memory loss.  The patient is accompanied by her daughter [name] today.  As you know, [name] is a 86 year old left-handed woman with an underlying medical history of diabetes, coronary artery disease with status post stent placements, history of MI, hypertension, hyperlipidemia, hypothyroidism, depression, osteoarthritis, carotid artery disease with status post carotid endarterectomy, history of pleural effusions, followed by pulmonology, who reports forgetfulness.  Symptoms have been ongoing for perhaps about a year as far as confusion goes.  Her daughter has noticed a more significant degree of confusion and forgetfulness in the past month or month and a half.  Patient is widowed and lived alone in [place] since 01/04/10.  In February 2021 she moved in with her daughter.  She has had several falls.  She injured her hip and required hip replacement.  She has a family history of memory loss.  Her mother had dementia.  Her younger sister had dementia and passed away.  The patient had a total of 2 brothers and 2 sisters, currently only 1 sister is alive.  She has 3 daughters and 1 son, she has a daughter, [name] in [place], she has a daughter, [name] in [place] and [name], her son lives in [place].  Her weight has been stable, appetite fairly good.  She has had some trouble sleeping and tried melatonin but is not currently taking it.  She has been using a wheelchair for longer distances.  She had no recent falls.  Her husband passed away in 01-04-10. I reviewed your note from 05/15/2020.  She had lab work at the time including BMP, vitamin B12, and urinalysis.  You also  ordered a brain MRI with and without contrast.  We requested blood test results.  I was able to review her results, in part on the portal that the daughter was able to pull up on her cell phone.  Her urinalysis was benign, vitamin B12 level 419, glucose 128, BUN 26, creatinine 1.5, numbers have improved since earlier in August.  TSH on 05/01/2020 was 0.79.  A1c in July 2021 was 6.5.  Lipid panel earlier in the year in 05-Jan-2023 showed a total cholesterol of 323, triglycerides 161, HDL 59, LDL 232.   She had a brain MRI without contrast on 05/16/2020 and I reviewed the results: IMPRESSION: No evidence of recent infarction, hemorrhage, or mass. Parenchymal volume loss and chronic microvascular ischemic changes.  Her Past Medical History Is Significant For: Past Medical History:  Diagnosis Date  . Coronary artery disease   . DM (diabetes mellitus) ([place])   . HTN (hypertension)   . Hyperlipidemia   . Myocardial infarct [facility]) 01/05/11    Her Past Surgical History Is Significant For: Past Surgical History:  Procedure Laterality Date  . CARDIAC CATHETERIZATION    . CAROTID ENDARTERECTOMY    . CORONARY ANGIOPLASTY    . CORONARY STENT PLACEMENT      Her Family History Is Significant For: Family History  Problem Relation Pitts of Onset  . Alzheimer's disease Mother   . Heart attack Father   . Heart disease Father   . Lymphoma Sister   . Heart  failure Brother     Her Social History Is Significant For: Social History   Socioeconomic History  . Marital status: Widowed    Spouse name: Not on file  . Number of children: Not on file  . Years of education: Not on file  . Highest education level: Not on file  Occupational History  . Not on file  Tobacco Use  . Smoking status: Former Smoker    Packs/day: 1.00    Years: 2.00    Pack years: 2.00    Types: Cigarettes    Quit date: 11/15/1969    Years since quitting: 50.6  . Smokeless tobacco: Never Used  Substance and Sexual Activity  . Alcohol  use: Never  . Drug use: Never  . Sexual activity: Not on file  Other Topics Concern  . Not on file  Social History Narrative  . Not on file   Social Determinants of Health   Financial Resource Strain:   . Difficulty of Paying Living Expenses: Not on file  Food Insecurity:   . Worried About Charity fundraiser in the Last Year: Not on file  . Ran Out of Food in the Last Year: Not on file  Transportation Needs:   . Lack of Transportation (Medical): Not on file  . Lack of Transportation (Non-Medical): Not on file  Physical Activity:   . Days of Exercise per Week: Not on file  . Minutes of Exercise per Session: Not on file  Stress:   . Feeling of Stress : Not on file  Social Connections:   . Frequency of Communication with Friends and Family: Not on file  . Frequency of Social Gatherings with Friends and Family: Not on file  . Attends Religious Services: Not on file  . Active Member of Clubs or Organizations: Not on file  . Attends Archivist Meetings: Not on file  . Marital Status: Not on file    Her Allergies Are:  Allergies  Allergen Reactions  . Lisinopril     cough  :   Her Current Medications Are:  Outpatient Encounter Medications as of 06/24/2020  Medication Sig  . Biotin 5000 MCG TABS Take 5,000 mcg by mouth daily.  . Cholecalciferol (VITAMIN D3 SUPER STRENGTH) 50 MCG (2000 UT) TABS Take 2,000 Units by mouth daily.  . clopidogrel (PLAVIX) 75 MG tablet Take 75 mg by mouth 3 (three) times a week. Take Mon, Wed, Fri  . levothyroxine (SYNTHROID) 25 MCG tablet Take 25 mcg by mouth daily before breakfast.  . losartan (COZAAR) 100 MG tablet Take 100 mg by mouth every evening.  . Metoprolol Tartrate 75 MG TABS Take 75 mg by mouth daily.   . pioglitazone (ACTOS) 15 MG tablet Take 15 mg by mouth daily.  . rosuvastatin (CRESTOR) 40 MG tablet Take 40 mg by mouth daily.  . sertraline (ZOLOFT) 100 MG tablet Take 100 mg by mouth at bedtime.   No facility-administered  encounter medications on file as of 06/24/2020.  :   Review of Systems:  Out of a complete 14 point review of systems, all are reviewed and negative with the exception of these symptoms as listed below:         Review of Systems  Neurological:       RM 1 with daughter, Julianne Rice. Paper referral from Beryl Meager (PCP) for acute memory loss. MMSE today: 15/30. (12 years of education). AFT: 6    Objective:  Neurological Exam  Physical Exam Physical Examination:  Vitals:   06/24/20 1418  BP: (!) 104/51  Pulse: (!) 52    General Examination: The patient is a very pleasant 86 y.o. female in no acute distress. She appears frail.  She is minimally verbal, but answers questions appropriately, sometimes with some delay.  She follows commands.  HEENT: Normocephalic, atraumatic, pupils are equal, round and reactive to light, extraocular tracking is fairly well preserved, no nuchal rigidity.  Hearing is intact are mildly impaired, face is symmetric, no facial masking noted.  No dysarthria, no voice tremor.  Airway examination reveals mild to moderate mouth dryness.  Tongue protrudes centrally and palate elevates symmetrically. There are no carotid bruits on auscultation.   Chest: Clear to auscultation without wheezing, rhonchi or crackles noted.  Heart: S1+S2+0, regular and normal without murmurs, rubs or gallops noted.   Abdomen: Soft, non-tender and non-distended with normal bowel sounds appreciated on auscultation.  Extremities: There is no pitting edema in the distal lower extremities bilaterally. Pedal pulses are intact.  Skin: Warm and dry without trophic changes noted.  Chronic appearing bruises in the distal upper and lower extremities.  Musculoskeletal: exam reveals no obvious joint deformities, but she has tenderness around the right ankle, especially of the lateral aspect which is mildly red.  Daughter reports that patient had a eschar spot and the eschar fell off but she  is still tender in that skin area.  Neurologically:  Mental status: The patient is awake, alert pays attention but she is not fully oriented.  She is oriented to state, county, city, doctor's office, and self.   Mood is normal and affect is constricted.  On 06/24/2020: MMSE: 15/30, CDT: 1/4, AFT: 6/min.  Cranial nerves II - XII are as described above under HEENT exam. In addition: shoulder shrug is normal with equal shoulder height noted. Motor exam: Thin bulk, global strength of 4 out of 5, no obvious drift or tremor.  Fine motor skills are mildly impaired globally without lateralization noted.    Cerebellar testing: No dysmetria or intention tremor. There is no truncal or gait ataxia.  Sensory exam: intact to light touch in the upper and lower extremities.  Gait, station and balance: She stands with difficulty and requires some assistance.  She is in a wheelchair.  She did not bring her walker.  She does have a cane with her today.  She walks with mild difficulty, also pain in the left hand.  She turns slowly, no shuffling.  Assessment and Plan:  In summary, Shirlyn Savin is a very pleasant 86 y.o.-year old female with an underlying medical history of diabetes, coronary artery disease with status post stent placements, history of MI, hypertension, hyperlipidemia, hypothyroidism, depression, osteoarthritis, carotid artery disease with status post carotid endarterectomy, history of pleural effusions, followed by pulmonology, who presents for evaluation of her memory loss and confusion of several months duration, most noticeable in the past 1 or 2 months.  Her family history of dementia does suggest increased risk for Alzheimer's disease, medical history suggests increased risk for vascular complications including vascular dementia.  She may have an overlap, memory scores are abnormal in the moderate range.  She had recent blood work through your office as well as a recent brain MRI.  I would like to  add some more blood tests today and we talked about utilizing medication for dementia.  I suggested a trial of low-dose generic Aricept, 10 mg strength half a pill daily.  We talked about expectations and potential side effects.  The patient is willing to proceed.  We talked about the importance of healthy lifestyle, good nutrition, good hydration, trying to stay active physically and mentally and having a good psychosocial support system.  The patient has been staying with her daughter for the past nearly 8 months.  She has not had any recent falls thankfully.  She is advised to stay better hydrated with water as she has had some increase in creatinine level lately.  Blood pressure is on the lower end of the spectrum and we have to be mindful especially with starting donepezil.  She also has a lower pulse rate currently.  We may or may not be able to have her increase the donepezil but we will start on a low dose and monitor.  She is advised to follow-up in about 3 months to see one of our nurse practitioners.  If well-tolerated, we can try to increase the donepezil at the time to 10 mg daily.  They are advised to call or email through [place] with any questions or concerns that may arise in the interim.  I answered all the questions today and the patient and her daughter were in agreement.   Thank you very much for allowing me to participate in the care of this nice patient. If I can be of any further assistance to you please do not hesitate to call me at [phone].  Sincerely,   Karina Age, MD, PhD

## 2020-06-25 LAB — COMPREHENSIVE METABOLIC PANEL
ALT: 21 IU/L (ref 0–32)
AST: 20 IU/L (ref 0–40)
Albumin/Globulin Ratio: 1.9 (ref 1.2–2.2)
Albumin: 4 g/dL (ref 3.6–4.6)
Alkaline Phosphatase: 80 IU/L (ref 44–121)
BUN/Creatinine Ratio: 19 (ref 12–28)
BUN: 32 mg/dL — ABNORMAL HIGH (ref 8–27)
Bilirubin Total: 0.3 mg/dL (ref 0.0–1.2)
CO2: 24 mmol/L (ref 20–29)
Calcium: 9.4 mg/dL (ref 8.7–10.3)
Chloride: 104 mmol/L (ref 96–106)
Creatinine, Ser: 1.72 mg/dL — ABNORMAL HIGH (ref 0.57–1.00)
GFR calc Af Amer: 31 mL/min/{1.73_m2} — ABNORMAL LOW (ref >59)
GFR calc non Af Amer: 27 mL/min/{1.73_m2} — ABNORMAL LOW (ref >59)
Globulin, Total: 2.1 g/dL (ref 1.5–4.5)
Glucose: 152 mg/dL — ABNORMAL HIGH (ref 65–99)
Potassium: 4.6 mmol/L (ref 3.5–5.2)
Sodium: 139 mmol/L (ref 134–144)
Total Protein: 6.1 g/dL (ref 6.0–8.5)

## 2020-06-25 LAB — AMMONIA: Ammonia: <17 ug/dL — ABNORMAL LOW (ref 28–135)

## 2020-06-25 LAB — HGB A1C W/O EAG: Hgb A1c MFr Bld: 7.4 % — ABNORMAL HIGH (ref 4.8–5.6)

## 2020-06-25 LAB — RPR: RPR Ser Ql: NONREACTIVE

## 2020-06-25 NOTE — Telephone Encounter (Signed)
Late entry: this was received yesterday and given to Dr. Rexene Alberts for review.

## 2020-06-26 NOTE — Progress Notes (Signed)
Please call patient or her daughter about her blood test results.  A1c was elevated at 7.4 which indicates that diabetes control could be improved.  Her blood sugar value was also elevated at 152, even though this was not a fasting blood sugar test.  Kidney function is impaired and numbers are worse from before.  I do believe she will need close follow-up with the primary care on these issues.

## 2020-06-27 ENCOUNTER — Telehealth: Payer: Self-pay

## 2020-06-27 NOTE — Telephone Encounter (Signed)
-----   Message from Star Age, MD sent at 06/26/2020  4:51 PM EDT ----- Please call patient or her daughter about her blood test results.  A1c was elevated at 7.4 which indicates that diabetes control could be improved.  Her blood sugar value was also elevated at 152, even though this was not a fasting blood sugar test.  Kidney function is impaired and numbers are worse from before.  I do believe she will need close follow-up with the primary care on these issues.

## 2020-06-27 NOTE — Telephone Encounter (Signed)
I called pt's daughter and left a detailed vm concerning lab results ( ok per dpr). I advised daughter to call back if she had any questions and advised we would send to report to PCP on file Dr. Jacalyn Lefevre.

## 2020-07-10 ENCOUNTER — Telehealth: Payer: Self-pay | Admitting: Cardiovascular Disease

## 2020-07-10 NOTE — Telephone Encounter (Signed)
Spoke with patients daughter(Ok per DPR) who states that patient recently had blood work done at PCP on 10/14. Per patients daughter patient had Lipid panel, BMET, and CBC done at PCP. Patients daughter would like to know if Dr. Audie Box needs anything other blood work or if patient will need to fast prior to appointment. Advised patients daughter to have blood work faxed over to office so that Dr. Audie Box will be able to review it prior to appointment. Advised patients daughter that I would forward message to Dr. Audie Box and if he requests any other blood I will return call and let her know. Patients daughter verbalized understanding.

## 2020-07-10 NOTE — Telephone Encounter (Signed)
Nothing needed.   [place] T. Audie Box, [place]  [address], [place], [place] [address] [phone]  9:47 PM

## 2020-07-10 NOTE — Telephone Encounter (Signed)
Patient's daughter, Jena Gauss, calling in to check and see if patient needs to fast prior to 10/25 appointment. Jena Gauss says that patient just completed lab work at her PCP last week and would like to know if Dr. Audie Box can use these lab results. Please call/advise.   Thank you!

## 2020-07-14 NOTE — Progress Notes (Signed)
Cardiology Office Note:   Date:  07/15/2020  NAME:  [name]    MRN: [id number] DOB:  11/29/1931   PCP:  Michael Boston, MD  Cardiologist:  Evalina Field, MD  Electrophysiologist:  None   Referring MD: Michael Boston, MD   Chief Complaint  Patient presents with  . Follow-up   History of Present Illness:   [name] is a 86 y.o. female with a hx of CAD, carotid artery disease s/p CEA, recurrent L pleural effusion, DM who presents for follow-up.  She reports she is doing well.  She describes no chest pain or shortness of breath.  She is not that active and lives with her daughter in [place].  She recently relocated from [facility].  She is walking periodically.  Mainly up and down the street.  No chest pain or shortness of breath reported.  No lower extremity edema reported.  Blood pressure is 125/62.  I did review her most recent blood work from her primary care physician which demonstrates an LDL cholesterol 84.  She also is anemic with a hemoglobin around 10.  Creatinine is also increased up to 1.8.  She does have some follow-up planned around that.  Overall, no major changes in symptoms.  Her daughter does report that there are some memory issues.  She was started on Aricept.  He had expressed a desire for less aggressive measures due to her advanced age.  Problem List 1. Recurrent L pleural effusion s/p pleurex catheter 2.  CAD status post stent in 2007/2012  3.  Hypertension 4.  Hyperlipidemia -Total cholesterol 159, HDL 57, LDL 84, triglycerides 92 5.  Arthritis 6.  Esophageal dilation 7.  Carotid artery stenosis status post right CEA 8. DM -A1c 6.9  Past Medical History: Past Medical History:  Diagnosis Date  . Coronary artery disease   . DM (diabetes mellitus) ([place])   . HTN (hypertension)   . Hyperlipidemia   . Myocardial infarct [facility]) 2012    Past Surgical History: Past Surgical History:  Procedure Laterality Date  . CARDIAC  CATHETERIZATION    . CAROTID ENDARTERECTOMY    . CORONARY ANGIOPLASTY    . CORONARY STENT PLACEMENT      Current Medications: Current Meds  Medication Sig  . Biotin 5000 MCG TABS Take 5,000 mcg by mouth daily.  . Cholecalciferol (VITAMIN D3 SUPER STRENGTH) 50 MCG (2000 UT) TABS Take 2,000 Units by mouth daily.  . clopidogrel (PLAVIX) 75 MG tablet Take 75 mg by mouth 3 (three) times a week. Take Mon, Wed, Fri  . donepezil (ARICEPT) 10 MG tablet Take 0.5 tablets (5 mg total) by mouth at bedtime.  [place] Kitchen levothyroxine (SYNTHROID) 25 MCG tablet Take 25 mcg by mouth daily before breakfast.  . losartan (COZAAR) 100 MG tablet Take 100 mg by mouth every evening.  . Metoprolol Tartrate 75 MG TABS Take 75 mg by mouth daily.   . pioglitazone (ACTOS) 15 MG tablet Take 15 mg by mouth daily.  . rosuvastatin (CRESTOR) 40 MG tablet Take 40 mg by mouth daily.  . sertraline (ZOLOFT) 100 MG tablet Take 100 mg by mouth at bedtime.     Allergies:    Lisinopril   Social History: Social History   Socioeconomic History  . Marital status: Widowed    Spouse name: Not on file  . Number of children: Not on file  . Years of education: Not on file  . Highest education level: Not on file  Occupational History  . Not on file  Tobacco Use  . Smoking status: Former Smoker    Packs/day: 1.00    Years: 2.00    Pack years: 2.00    Types: Cigarettes    Quit date: 11/15/1969    Years since quitting: 50.6  . Smokeless tobacco: Never Used  Substance and Sexual Activity  . Alcohol use: Never  . Drug use: Never  . Sexual activity: Not on file  Other Topics Concern  . Not on file  Social History Narrative  . Not on file   Social Determinants of Health   Financial Resource Strain:   . Difficulty of Paying Living Expenses: Not on file  Food Insecurity:   . Worried About Charity fundraiser in the Last Year: Not on file  . Ran Out of Food in the Last Year: Not on file  Transportation Needs:   . Lack of  Transportation (Medical): Not on file  . Lack of Transportation (Non-Medical): Not on file  Physical Activity:   . Days of Exercise per Week: Not on file  . Minutes of Exercise per Session: Not on file  Stress:   . Feeling of Stress : Not on file  Social Connections:   . Frequency of Communication with Friends and Family: Not on file  . Frequency of Social Gatherings with Friends and Family: Not on file  . Attends Religious Services: Not on file  . Active Member of Clubs or Organizations: Not on file  . Attends Archivist Meetings: Not on file  . Marital Status: Not on file     Family History: The patient's family history includes Alzheimer's disease in her mother; Heart attack in her father; Heart disease in her father; Heart failure in her brother; Lymphoma in her sister.  ROS:   All other ROS reviewed and negative. Pertinent positives noted in the HPI.     EKGs/Labs/Other Studies Reviewed:   The following studies were personally reviewed by me today:  Recent Labs: 06/24/2020: ALT 21; BUN 32; Creatinine, Ser 1.72; Potassium 4.6; Sodium 139   Recent Lipid Panel No results found for: CHOL, TRIG, HDL, CHOLHDL, VLDL, LDLCALC, LDLDIRECT  Physical Exam:   VS:  BP 125/62   Pulse 81   Temp (!) 97 F (36.1 C)   Ht 5\' 7"  (1.702 m)   Wt 119 lb 12.8 oz (54.3 kg)   SpO2 95%   BMI 18.76 kg/m    Wt Readings from Last 3 Encounters:  07/15/20 119 lb 12.8 oz (54.3 kg)  06/24/20 115 lb 8 oz (52.4 kg)  03/19/20 119 lb 3.2 oz (54.1 kg)    General: Well nourished, well developed, in no acute distress Heart: Atraumatic, normal size  Eyes: PEERLA, EOMI  Neck: Supple, no JVD Endocrine: No thryomegaly Cardiac: Normal S1, S2; RRR; no murmurs, rubs, or gallops Lungs: Clear to auscultation bilaterally, no wheezing, rhonchi or rales  Abd: Soft, nontender, no hepatomegaly  Ext: No edema, pulses 2+ Musculoskeletal: No deformities, BUE and BLE strength normal and equal Skin: Warm  and dry, no rashes   Neuro: Alert and oriented to person, place, time, and situation, CNII-XII grossly intact, no focal deficits  Psych: Normal mood and affect   ASSESSMENT:   [name] is a 86 y.o. female who presents for the following: 1. Coronary artery disease involving native coronary artery of native heart without angina pectoris   2. Bilateral carotid artery stenosis   3. Essential hypertension   4.  Mixed hyperlipidemia     PLAN:   1. Coronary artery disease involving native coronary artery of native heart without angina pectoris -Status post PCI in 2007 in 2012 in [place].  No further symptoms of angina.  She remains on Plavix 75 mg 3 days/week.  She was on this regimen in the past.  She is on Crestor.  Most recent LDL cholesterol is 84.  I think this is an acceptable goal for her given her advanced age.  There is also some dementia.  We did discuss more aggressive cholesterol management however we have all decided that her cholesterol is acceptable. -No symptoms of angina.  She is on a beta-blocker.  2. Bilateral carotid artery stenosis -Status post right carotid endarterectomy.  No bruit on exam.  We will obtain vascular ultrasound studies before I see her back in 1 year.  Given her advanced age we may no longer monitor this.  We will have more of a focus on comfort and quality versus more aggressive care.  3. Essential hypertension -Blood pressure quite acceptable today.  No change in medications.  4. Mixed hyperlipidemia -Most recent LDL 84.  Goal would be less than 70.  However given her advanced age and concerns for possible dementia I think we will keep her on her current dose of medication.  Disposition: Return in about 1 year (around 07/15/2021).  Medication Adjustments/Labs and Tests Ordered: Current medicines are reviewed at length with the patient today.  Concerns regarding medicines are outlined above.  Orders Placed This Encounter   Procedures  . VAS US CAROTID   No orders of the defined types were placed in this encounter.   Patient Instructions  Medication Instructions:  NO CHANGES *If you need a refill on your cardiac medications before your next appointment, please call your pharmacy*   Lab Work: NOT NEEDED   Testing/Procedures: WILL BE SCHEDULE 12 MONTHS - OCT 2022 AT [facility] Your physician has requested that you have a carotid duplex. This test is an ultrasound of the carotid arteries in your neck. It looks at blood flow through these arteries that supply the brain with blood. Allow one hour for this exam. There are no restrictions or special instructions.    Follow-Up: At [facility] [place], you and your health needs are our priority.  As part of our continuing mission to provide you with exceptional heart care, we have created designated Provider Care Teams.  These Care Teams include your primary Cardiologist (physician) and Advanced Practice Providers (APPs -  Physician Assistants and Nurse Practitioners) who all work together to provide you with the care you need, when you need it.    Your next appointment:   12 month(s)  The format for your next appointment:   In Person  Provider:   Eleonore Chiquito, MD       Time Spent with Patient: I have spent a total of 25 minutes with patient reviewing hospital notes, telemetry, EKGs, labs and examining the patient as well as establishing an assessment and plan that was discussed with the patient.  > 50% of time was spent in direct patient care.  Signed, Addison Naegeli. Audie Box, [place]  [address] [place], [place] [address] [phone]  07/15/2020 9:06 AM

## 2020-07-15 ENCOUNTER — Encounter: Payer: Self-pay | Admitting: Cardiovascular Disease

## 2020-07-15 ENCOUNTER — Other Ambulatory Visit: Payer: Self-pay

## 2020-07-15 ENCOUNTER — Ambulatory Visit (INDEPENDENT_AMBULATORY_CARE_PROVIDER_SITE_OTHER): Payer: Medicare Other | Admitting: Cardiovascular Disease

## 2020-07-15 VITALS — BP 125/62 | HR 81 | Temp 97.0°F | Ht 67.0 in | Wt 119.8 lb

## 2020-07-15 DIAGNOSIS — I2510 Atherosclerotic heart disease of native coronary artery without angina pectoris: Secondary | ICD-10-CM | POA: Diagnosis not present

## 2020-07-15 DIAGNOSIS — I10 Essential (primary) hypertension: Secondary | ICD-10-CM | POA: Diagnosis not present

## 2020-07-15 DIAGNOSIS — E782 Mixed hyperlipidemia: Secondary | ICD-10-CM

## 2020-07-15 DIAGNOSIS — I6523 Occlusion and stenosis of bilateral carotid arteries: Secondary | ICD-10-CM | POA: Diagnosis not present

## 2020-07-15 NOTE — Patient Instructions (Addendum)
Medication Instructions:  NO CHANGES *If you need a refill on your cardiac medications before your next appointment, please call your pharmacy*   Lab Work: NOT NEEDED   Testing/Procedures: WILL BE SCHEDULE 12 MONTHS - OCT 2022 AT [place] Your physician has requested that you have a carotid duplex. This test is an ultrasound of the carotid arteries in your neck. It looks at blood flow through these arteries that supply the brain with blood. Allow one hour for this exam. There are no restrictions or special instructions.    Follow-Up: At [facility], you and your health needs are our priority.  As part of our continuing mission to provide you with exceptional heart care, we have created designated Provider Care Teams.  These Care Teams include your primary Cardiologist (physician) and Advanced Practice Providers (APPs -  Physician Assistants and Nurse Practitioners) who all work together to provide you with the care you need, when you need it.    Your next appointment:   12 month(s)  The format for your next appointment:   In Person  Provider:   Eleonore Chiquito, MD

## 2020-07-21 ENCOUNTER — Encounter: Payer: Self-pay | Admitting: Neurology

## 2020-07-22 ENCOUNTER — Telehealth: Payer: Self-pay | Admitting: Neurology

## 2020-07-22 DIAGNOSIS — R410 Disorientation, unspecified: Secondary | ICD-10-CM

## 2020-07-22 DIAGNOSIS — Z818 Family history of other mental and behavioral disorders: Secondary | ICD-10-CM

## 2020-07-22 DIAGNOSIS — F0390 Unspecified dementia without behavioral disturbance: Secondary | ICD-10-CM

## 2020-07-22 MED ORDER — DONEPEZIL HCL 10 MG PO TABS: 10.0000 mg | ORAL_TABLET | Freq: Every day | ORAL | 3 refills | Status: DC

## 2020-07-22 NOTE — Telephone Encounter (Signed)
Mychart message sent.

## 2020-07-22 NOTE — Telephone Encounter (Signed)
Please advise patient's daughter that I received her MyChart message and am glad to hear that patient is able to tolerate the donepezil at 5 mg strength.  I will change the prescription to 10 mg daily. I  provided a 90-day prescription with refills.

## 2020-08-11 ENCOUNTER — Encounter (HOSPITAL_BASED_OUTPATIENT_CLINIC_OR_DEPARTMENT_OTHER): Payer: Self-pay | Admitting: Emergency Medicine

## 2020-08-11 ENCOUNTER — Emergency Department (HOSPITAL_BASED_OUTPATIENT_CLINIC_OR_DEPARTMENT_OTHER): Payer: Medicare Other

## 2020-08-11 ENCOUNTER — Other Ambulatory Visit: Payer: Self-pay

## 2020-08-11 ENCOUNTER — Inpatient Hospital Stay (HOSPITAL_BASED_OUTPATIENT_CLINIC_OR_DEPARTMENT_OTHER)
Admission: EM | Admit: 2020-08-11 | Discharge: 2020-08-14 | DRG: 291 | Disposition: A | Discharge status: Home Health | Payer: Medicare Other | Attending: Internal Medicine | Admitting: Internal Medicine

## 2020-08-11 DIAGNOSIS — X58XXXA Exposure to other specified factors, initial encounter: Secondary | ICD-10-CM | POA: Diagnosis present

## 2020-08-11 DIAGNOSIS — R0600 Dyspnea, unspecified: Secondary | ICD-10-CM | POA: Diagnosis not present

## 2020-08-11 DIAGNOSIS — R413 Other amnesia: Secondary | ICD-10-CM | POA: Diagnosis not present

## 2020-08-11 DIAGNOSIS — Z8249 Family history of ischemic heart disease and other diseases of the circulatory system: Secondary | ICD-10-CM

## 2020-08-11 DIAGNOSIS — J90 Pleural effusion, not elsewhere classified: Secondary | ICD-10-CM | POA: Diagnosis present

## 2020-08-11 DIAGNOSIS — I509 Heart failure, unspecified: Secondary | ICD-10-CM | POA: Diagnosis present

## 2020-08-11 DIAGNOSIS — S2239XA Fracture of one rib, unspecified side, initial encounter for closed fracture: Secondary | ICD-10-CM | POA: Diagnosis present

## 2020-08-11 DIAGNOSIS — I340 Nonrheumatic mitral (valve) insufficiency: Secondary | ICD-10-CM | POA: Diagnosis not present

## 2020-08-11 DIAGNOSIS — E785 Hyperlipidemia, unspecified: Secondary | ICD-10-CM

## 2020-08-11 DIAGNOSIS — I2510 Atherosclerotic heart disease of native coronary artery without angina pectoris: Secondary | ICD-10-CM

## 2020-08-11 DIAGNOSIS — Z955 Presence of coronary angioplasty implant and graft: Secondary | ICD-10-CM

## 2020-08-11 DIAGNOSIS — Z820 Family history of epilepsy and other diseases of the nervous system: Secondary | ICD-10-CM

## 2020-08-11 DIAGNOSIS — I130 Hypertensive heart and chronic kidney disease with heart failure and stage 1 through stage 4 chronic kidney disease, or unspecified chronic kidney disease: Secondary | ICD-10-CM | POA: Diagnosis present

## 2020-08-11 DIAGNOSIS — E1122 Type 2 diabetes mellitus with diabetic chronic kidney disease: Secondary | ICD-10-CM | POA: Diagnosis present

## 2020-08-11 DIAGNOSIS — I10 Essential (primary) hypertension: Secondary | ICD-10-CM | POA: Diagnosis not present

## 2020-08-11 DIAGNOSIS — E7849 Other hyperlipidemia: Secondary | ICD-10-CM | POA: Diagnosis not present

## 2020-08-11 DIAGNOSIS — Z79899 Other long term (current) drug therapy: Secondary | ICD-10-CM | POA: Diagnosis not present

## 2020-08-11 DIAGNOSIS — N1832 Chronic kidney disease, stage 3b: Secondary | ICD-10-CM | POA: Diagnosis present

## 2020-08-11 DIAGNOSIS — N183 Chronic kidney disease, stage 3 unspecified: Secondary | ICD-10-CM

## 2020-08-11 DIAGNOSIS — Z807 Family history of other malignant neoplasms of lymphoid, hematopoietic and related tissues: Secondary | ICD-10-CM

## 2020-08-11 DIAGNOSIS — Z20822 Contact with and (suspected) exposure to covid-19: Secondary | ICD-10-CM | POA: Diagnosis present

## 2020-08-11 DIAGNOSIS — R001 Bradycardia, unspecified: Secondary | ICD-10-CM | POA: Diagnosis present

## 2020-08-11 DIAGNOSIS — Z87891 Personal history of nicotine dependence: Secondary | ICD-10-CM

## 2020-08-11 DIAGNOSIS — I5030 Unspecified diastolic (congestive) heart failure: Secondary | ICD-10-CM | POA: Diagnosis present

## 2020-08-11 DIAGNOSIS — Z7902 Long term (current) use of antithrombotics/antiplatelets: Secondary | ICD-10-CM

## 2020-08-11 DIAGNOSIS — Z888 Allergy status to other drugs, medicaments and biological substances status: Secondary | ICD-10-CM | POA: Diagnosis not present

## 2020-08-11 DIAGNOSIS — E876 Hypokalemia: Secondary | ICD-10-CM | POA: Diagnosis present

## 2020-08-11 DIAGNOSIS — J9601 Acute respiratory failure with hypoxia: Secondary | ICD-10-CM | POA: Diagnosis present

## 2020-08-11 DIAGNOSIS — N1831 Chronic kidney disease, stage 3a: Secondary | ICD-10-CM | POA: Diagnosis not present

## 2020-08-11 DIAGNOSIS — E119 Type 2 diabetes mellitus without complications: Secondary | ICD-10-CM | POA: Diagnosis not present

## 2020-08-11 DIAGNOSIS — I361 Nonrheumatic tricuspid (valve) insufficiency: Secondary | ICD-10-CM | POA: Diagnosis not present

## 2020-08-11 DIAGNOSIS — Z79890 Hormone replacement therapy (postmenopausal): Secondary | ICD-10-CM | POA: Diagnosis not present

## 2020-08-11 DIAGNOSIS — I252 Old myocardial infarction: Secondary | ICD-10-CM

## 2020-08-11 DIAGNOSIS — R0902 Hypoxemia: Secondary | ICD-10-CM

## 2020-08-11 DIAGNOSIS — Z9889 Other specified postprocedural states: Secondary | ICD-10-CM

## 2020-08-11 HISTORY — DX: Essential (primary) hypertension: I10

## 2020-08-11 HISTORY — DX: Disorder of kidney and ureter, unspecified: N28.9

## 2020-08-11 LAB — I-STAT VENOUS BLOOD GAS, ED
Acid-base deficit: 2 mmol/L (ref 0.0–2.0)
Bicarbonate: 24.2 mmol/L (ref 20.0–28.0)
Calcium, Ion: 1.24 mmol/L (ref 1.15–1.40)
HCT: 31 % — ABNORMAL LOW (ref 36.0–46.0)
Hemoglobin: 10.5 g/dL — ABNORMAL LOW (ref 12.0–15.0)
O2 Saturation: 59 %
Patient temperature: 98.2
Potassium: 3.9 mmol/L (ref 3.5–5.1)
Sodium: 141 mmol/L (ref 135–145)
TCO2: 26 mmol/L (ref 22–32)
pCO2, Ven: 44 mmHg (ref 44.0–60.0)
pH, Ven: 7.346 (ref 7.250–7.430)
pO2, Ven: 32 mmHg (ref 32.0–45.0)

## 2020-08-11 LAB — CBC WITH DIFFERENTIAL/PLATELET
Abs Immature Granulocytes: 0.05 10*3/uL (ref 0.00–0.07)
Basophils Absolute: 0 10*3/uL (ref 0.0–0.1)
Basophils Relative: 1 %
Eosinophils Absolute: 0.1 10*3/uL (ref 0.0–0.5)
Eosinophils Relative: 2 %
HCT: 33.8 % — ABNORMAL LOW (ref 36.0–46.0)
Hemoglobin: 10.8 g/dL — ABNORMAL LOW (ref 12.0–15.0)
Immature Granulocytes: 1 %
Lymphocytes Relative: 9 %
Lymphs Abs: 0.7 10*3/uL (ref 0.7–4.0)
MCH: 32.8 pg (ref 26.0–34.0)
MCHC: 32 g/dL (ref 30.0–36.0)
MCV: 102.7 fL — ABNORMAL HIGH (ref 80.0–100.0)
Monocytes Absolute: 0.7 10*3/uL (ref 0.1–1.0)
Monocytes Relative: 9 %
Neutro Abs: 6 10*3/uL (ref 1.7–7.7)
Neutrophils Relative %: 78 %
Platelets: 375 10*3/uL (ref 150–400)
RBC: 3.29 MIL/uL — ABNORMAL LOW (ref 3.87–5.11)
RDW: 15.5 % (ref 11.5–15.5)
WBC: 7.6 10*3/uL (ref 4.0–10.5)
nRBC: 0 % (ref 0.0–0.2)

## 2020-08-11 LAB — PROTIME-INR
INR: 1.1 (ref 0.8–1.2)
Prothrombin Time: 13.3 s (ref 11.4–15.2)

## 2020-08-11 LAB — COMPREHENSIVE METABOLIC PANEL
ALT: 29 U/L (ref 0–44)
AST: 22 U/L (ref 15–41)
Albumin: 3.7 g/dL (ref 3.5–5.0)
Alkaline Phosphatase: 70 U/L (ref 38–126)
Anion gap: 10 (ref 5–15)
BUN: 23 mg/dL (ref 8–23)
CO2: 23 mmol/L (ref 22–32)
Calcium: 9.2 mg/dL (ref 8.9–10.3)
Chloride: 105 mmol/L (ref 98–111)
Creatinine, Ser: 1.6 mg/dL — ABNORMAL HIGH (ref 0.44–1.00)
GFR, Estimated: 31 mL/min — ABNORMAL LOW (ref >60)
Glucose, Bld: 187 mg/dL — ABNORMAL HIGH (ref 70–99)
Potassium: 3.7 mmol/L (ref 3.5–5.1)
Sodium: 138 mmol/L (ref 135–145)
Total Bilirubin: 0.8 mg/dL (ref 0.3–1.2)
Total Protein: 7.2 g/dL (ref 6.5–8.1)

## 2020-08-11 LAB — RESP PANEL BY RT-PCR (FLU A&B, COVID) ARPGX2
Influenza A by PCR: NEGATIVE
Influenza B by PCR: NEGATIVE
SARS Coronavirus 2 by RT PCR: NEGATIVE

## 2020-08-11 LAB — TROPONIN I (HIGH SENSITIVITY)
Troponin I (High Sensitivity): 18 ng/L — ABNORMAL HIGH
Troponin I (High Sensitivity): 19 ng/L — ABNORMAL HIGH

## 2020-08-11 LAB — GLUCOSE, CAPILLARY: Glucose-Capillary: 177 mg/dL — ABNORMAL HIGH (ref 70–99)

## 2020-08-11 LAB — LACTIC ACID, PLASMA: Lactic Acid, Venous: 1.3 mmol/L (ref 0.5–1.9)

## 2020-08-11 LAB — LIPASE, BLOOD: Lipase: 51 U/L (ref 11–51)

## 2020-08-11 LAB — BRAIN NATRIURETIC PEPTIDE: B Natriuretic Peptide: 1055 pg/mL — ABNORMAL HIGH (ref 0.0–100.0)

## 2020-08-11 IMAGING — DX DG CHEST 1V PORT
1 series · 1 of 1 positions shown · non-contrast
Comparison: [DATE]

CLINICAL DATA: Fatigue, hypoxia, wheezing, cough

EXAM:
PORTABLE CHEST 1 VIEW

[chest ap]
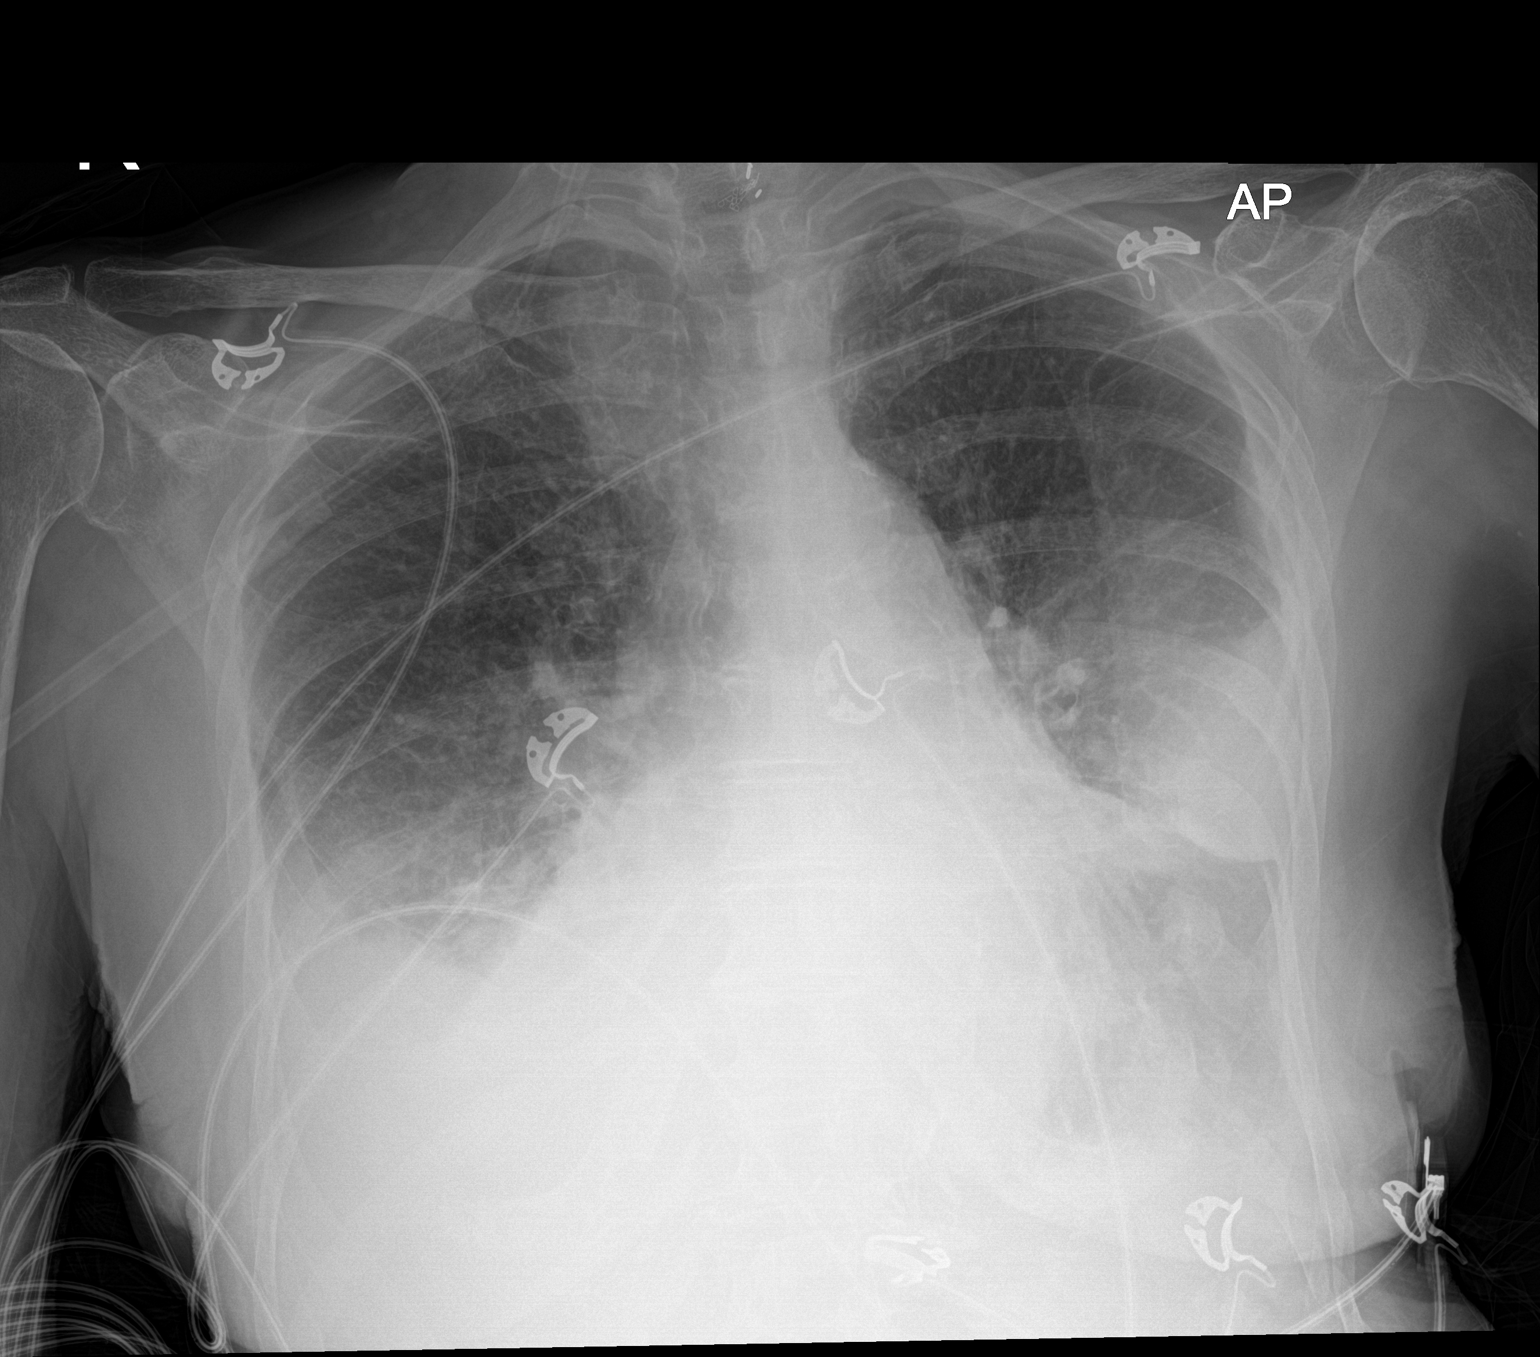

[1 of 1 positions shown; findings below may reference images not displayed]

FINDINGS: There are moderate bilateral layering pleural effusions and
associated atelectasis or consolidation. Cardiomegaly.
IMPRESSION: 1. There are moderate bilateral layering pleural effusions and
associated atelectasis or consolidation.
2. Cardiomegaly.

## 2020-08-11 IMAGING — CT CT CHEST W/O CM
2 of 4 series · 15 of 36 positions shown, 18 images · non-contrast
Comparison: Chest x-ray [DATE].  Chest CT [DATE].

CLINICAL DATA: Cardiomegaly. Follow-up. Patient felt tired today.
Wheezing and cough.

EXAM:
CT CHEST WITHOUT CONTRAST
TECHNIQUE: Multidetector CT imaging of the chest was performed following the
standard protocol without IV contrast.

[Series 2: thorax · axial · 0.72mm/px · z∈[+891,+1165]mm · 12 of 159 slices shown, 15 images]
[im 11/159  mediastinal]
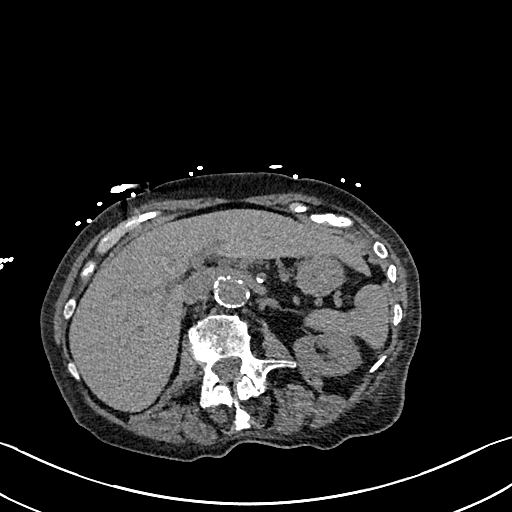
[im 11/159  lung]
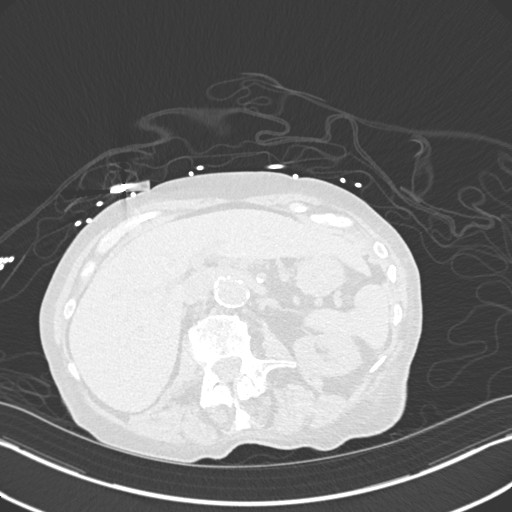
[im 22/159  lung]
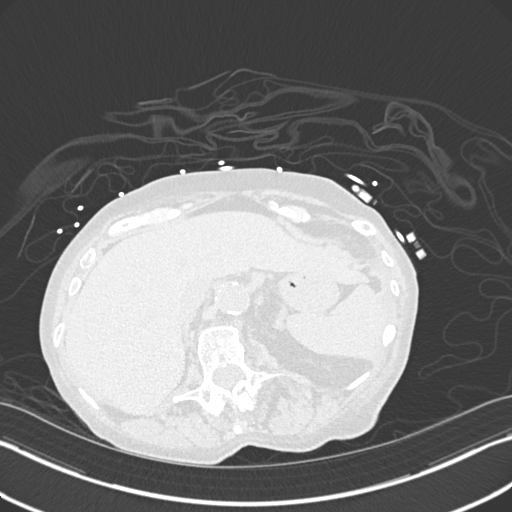
[im 32/159  lung]
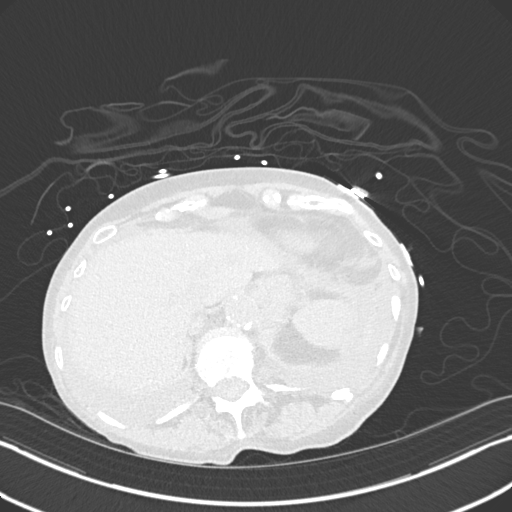
[im 53/159  lung]
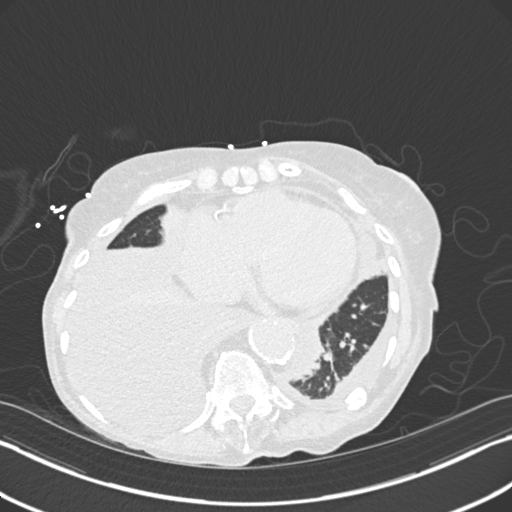
[im 64/159  mediastinal]
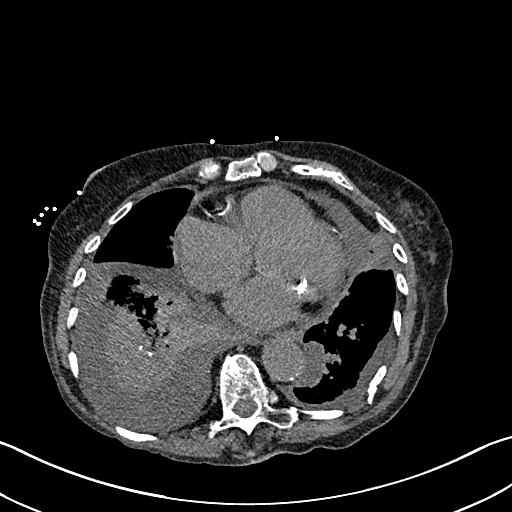
[im 64/159  lung]
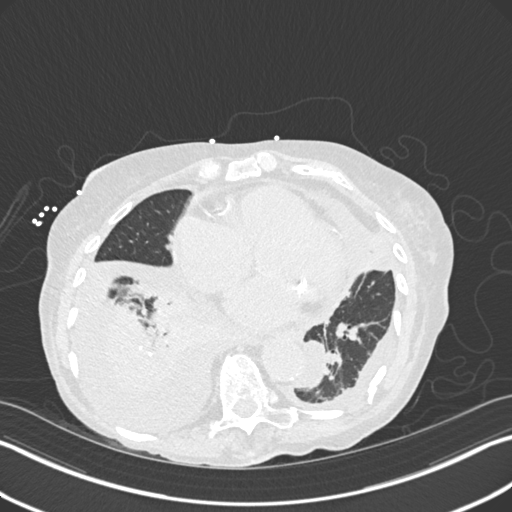
[im 74/159  lung]
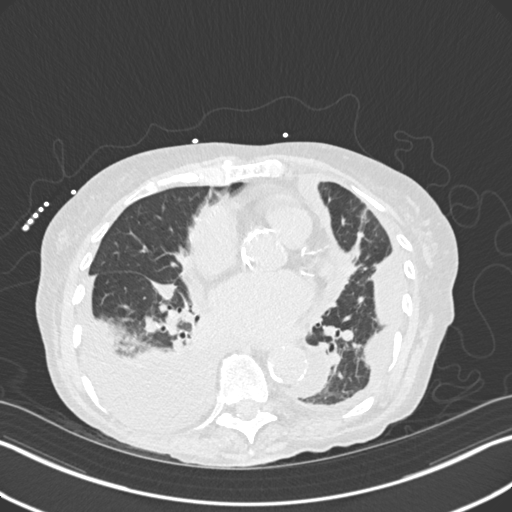
[im 85/159  lung]
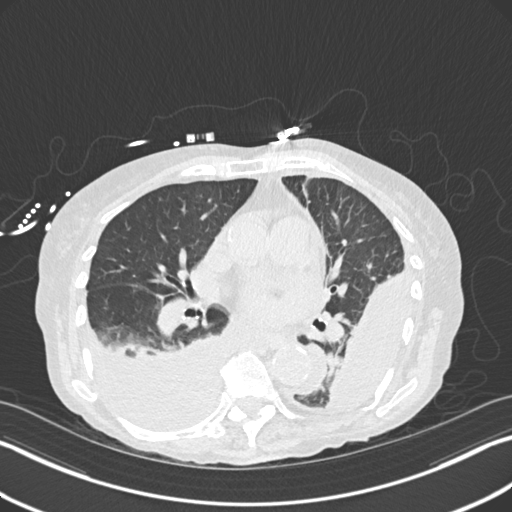
[im 95/159  lung]
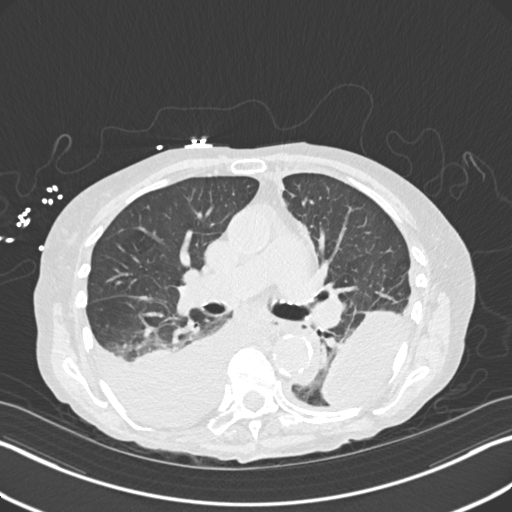
[im 106/159  mediastinal]
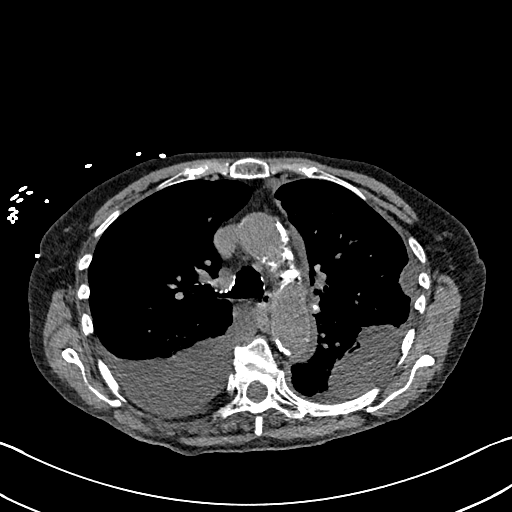
[im 106/159  lung]
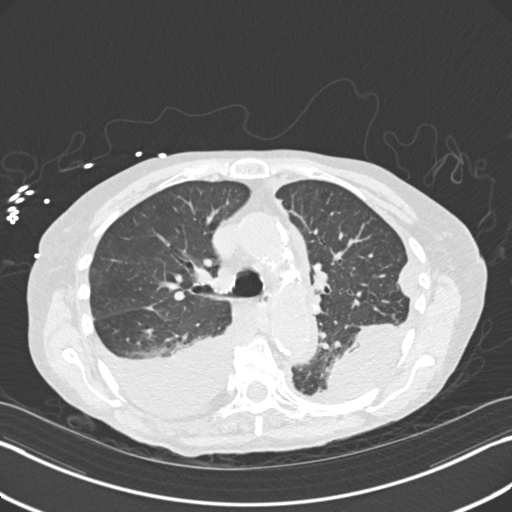
[im 127/159  lung]
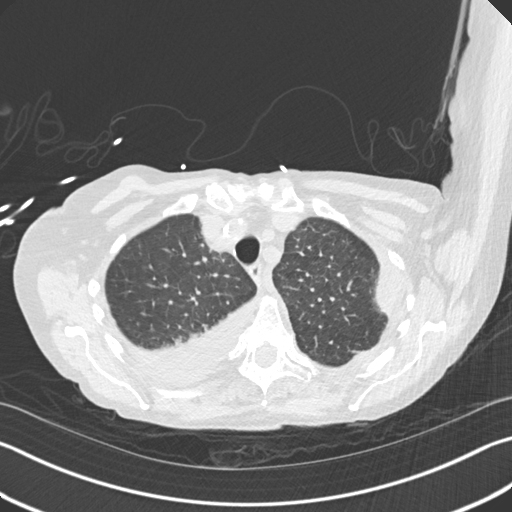
[im 137/159  lung]
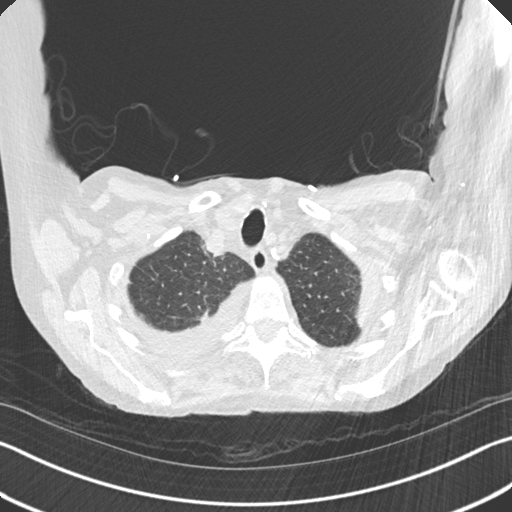
[im 148/159  lung]
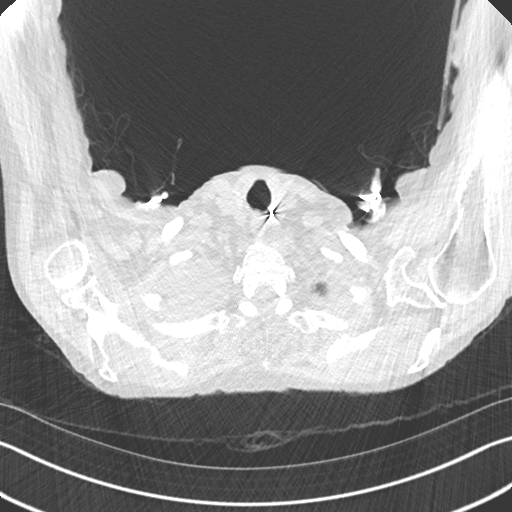

[Series 5: coronal · coronal · 0.63mm/px · 3 of 126 slices shown]
[im 26/126  lung]
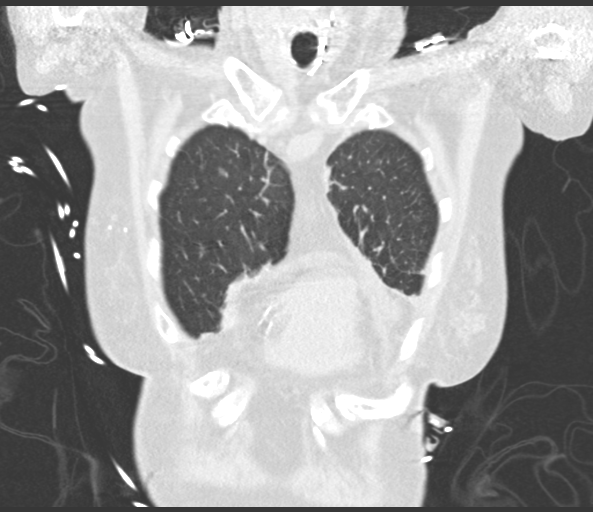
[im 51/126  lung]
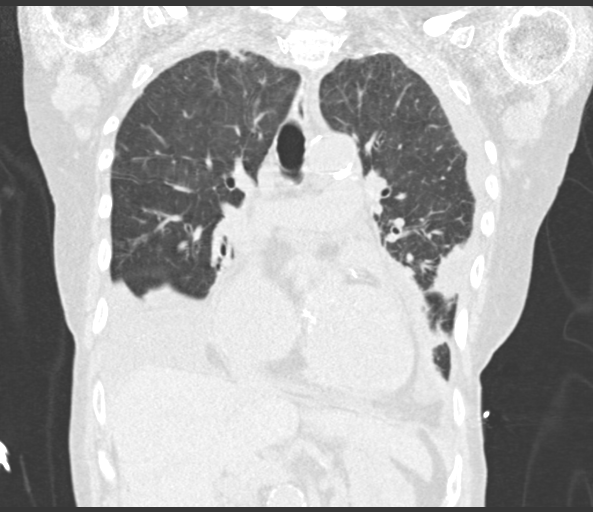
[im 76/126  lung]
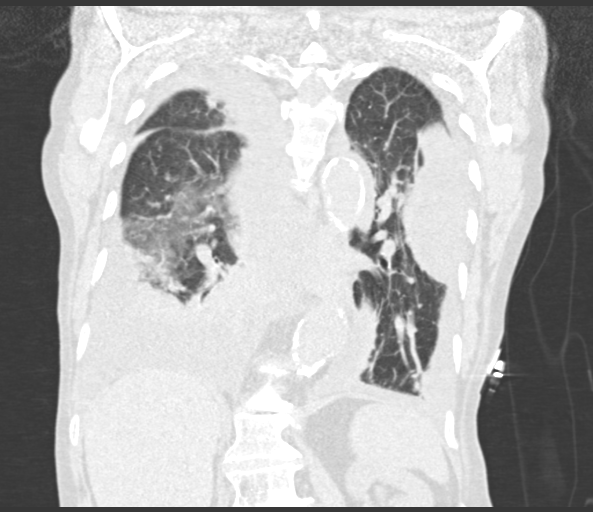

[15 of 36 positions shown; findings below may reference images not displayed]

FINDINGS: Cardiovascular: Cardiomegaly. Three-vessel coronary artery disease.
The thoracic aorta demonstrates moderate atherosclerotic change
without aneurysm. Central pulmonary arteries are unremarkable.

Mediastinum/Nodes: There is a moderate right-sided pleural effusion
which appears to be free-flowing. There is a small to moderate
left-sided pleural effusion with loculated components. There is a
tiny pericardial effusion. The patient appears to be status post
resection of the left thyroid lobe. The remaining right thyroid lobe
is grossly unremarkable. The esophagus is normal. No adenopathy. The
chest wall is normal in appearance.

Lungs/Pleura: There is atelectasis associated with the bilateral
effusions. Scarring in the apices is stable. Minimal interlobular
septal thickening suggests pulmonary venous congestion. No evidence
of pneumonia. No nodule or mass.

Upper Abdomen: No acute abnormality.

Musculoskeletal: No chest wall mass or suspicious bone lesions
identified.
IMPRESSION: 1. There is a moderate free-flowing right-sided pleural effusion.
2. There is a small to moderate left-sided pleural effusion with
loculated components.
3. Cardiomegaly and mild pulmonary venous congestion.
4. Thoracic aortic atherosclerosis.  Coronary artery disease.
5. No other acute abnormalities.

Aortic Atherosclerosis ([SA]-[SA]).

## 2020-08-11 MED ORDER — SERTRALINE HCL 100 MG PO TABS
100.0000 mg | ORAL_TABLET | Freq: Every day | ORAL | Status: DC
  Administered 2020-08-11 – 2020-08-13 (×3): 100 mg via ORAL
  Filled 2020-08-11 (×3): qty 1

## 2020-08-11 MED ORDER — ENOXAPARIN SODIUM 30 MG/0.3ML ~~LOC~~ SOLN
30.0000 mg | SUBCUTANEOUS | Status: DC
  Administered 2020-08-11 – 2020-08-13 (×3): 30 mg via SUBCUTANEOUS
  Filled 2020-08-11 (×3): qty 0.3

## 2020-08-11 MED ORDER — ACETAMINOPHEN 650 MG RE SUPP: 650.0000 mg | Freq: Four times a day (QID) | RECTAL | Status: DC | PRN

## 2020-08-11 MED ORDER — LOSARTAN POTASSIUM 50 MG PO TABS: 100.0000 mg | ORAL_TABLET | Freq: Every evening | ORAL | Status: DC

## 2020-08-11 MED ORDER — LEVOTHYROXINE SODIUM 25 MCG PO TABS
25.0000 ug | ORAL_TABLET | Freq: Every day | ORAL | Status: DC
  Administered 2020-08-12 – 2020-08-14 (×3): 25 ug via ORAL
  Filled 2020-08-11 (×3): qty 1

## 2020-08-11 MED ORDER — DONEPEZIL HCL 10 MG PO TABS
10.0000 mg | ORAL_TABLET | Freq: Every day | ORAL | Status: DC
  Administered 2020-08-11 – 2020-08-13 (×3): 10 mg via ORAL
  Filled 2020-08-11 (×3): qty 1

## 2020-08-11 MED ORDER — LOSARTAN POTASSIUM 50 MG PO TABS
50.0000 mg | ORAL_TABLET | Freq: Every evening | ORAL | Status: DC
  Administered 2020-08-11 – 2020-08-13 (×3): 50 mg via ORAL
  Filled 2020-08-11 (×3): qty 1

## 2020-08-11 MED ORDER — ROSUVASTATIN CALCIUM 20 MG PO TABS
40.0000 mg | ORAL_TABLET | Freq: Every day | ORAL | Status: DC
  Administered 2020-08-11 – 2020-08-13 (×3): 40 mg via ORAL
  Filled 2020-08-11 (×4): qty 2

## 2020-08-11 MED ORDER — FUROSEMIDE 10 MG/ML IJ SOLN
40.0000 mg | Freq: Once | INTRAMUSCULAR | Status: AC
  Administered 2020-08-11: 40 mg via INTRAVENOUS
  Filled 2020-08-11: qty 4

## 2020-08-11 MED ORDER — POLYETHYLENE GLYCOL 3350 17 G PO PACK: 17.0000 g | PACK | Freq: Every day | ORAL | Status: DC | PRN

## 2020-08-11 MED ORDER — CLOPIDOGREL BISULFATE 75 MG PO TABS
75.0000 mg | ORAL_TABLET | ORAL | Status: DC
  Administered 2020-08-12 – 2020-08-14 (×2): 75 mg via ORAL
  Filled 2020-08-11 (×2): qty 1

## 2020-08-11 MED ORDER — METOPROLOL TARTRATE 25 MG PO TABS: 75.0000 mg | ORAL_TABLET | Freq: Every day | ORAL | Status: DC

## 2020-08-11 MED ORDER — INSULIN ASPART 100 UNIT/ML ~~LOC~~ SOLN
0.0000 [IU] | Freq: Three times a day (TID) | SUBCUTANEOUS | Status: DC
  Administered 2020-08-12: 1 [IU] via SUBCUTANEOUS
  Administered 2020-08-13: 2 [IU] via SUBCUTANEOUS
  Administered 2020-08-13 – 2020-08-14 (×2): 1 [IU] via SUBCUTANEOUS

## 2020-08-11 MED ORDER — SODIUM CHLORIDE 0.9% FLUSH
3.0000 mL | Freq: Two times a day (BID) | INTRAVENOUS | Status: DC
  Administered 2020-08-11 – 2020-08-14 (×6): 3 mL via INTRAVENOUS

## 2020-08-11 MED ORDER — ACETAMINOPHEN 325 MG PO TABS: 650.0000 mg | ORAL_TABLET | Freq: Four times a day (QID) | ORAL | Status: DC | PRN

## 2020-08-11 MED ORDER — FUROSEMIDE 10 MG/ML IJ SOLN
40.0000 mg | Freq: Two times a day (BID) | INTRAMUSCULAR | Status: DC
  Administered 2020-08-12: 40 mg via INTRAVENOUS
  Filled 2020-08-11: qty 4

## 2020-08-11 NOTE — ED Notes (Signed)
Bedside commode moved to room s/p lasix, patient and daughter encouraged to call for assistance to bedside commode.Daughter states patient sometimes needs to go immediately with no warning.  Spoke with Financial controller about possible need to get assistance into room quickly if call light is used.

## 2020-08-11 NOTE — ED Triage Notes (Addendum)
Felt tired yesterday and today presents with low o2 levels and family hears a wheeze and pt has a cough 79-82 % ra. Pt placed on 4 l   In room pt states  She is voiding alot

## 2020-08-11 NOTE — H&P (Signed)
History and Physical   [name] UXN:[id number] DOB: March 15, 1932 DOA: 08/11/2020  PCP: Michael Boston, MD   Patient coming from: Home  Chief Complaint: Dyspnea, edema  HPI: [name] is a 86 y.o. female with medical history significant of CAD status post stent x2, diabetes, hypertension, hyperlipidemia, CKD, memory impairment who presents with worsening shortness of breath and edema.  History obtained with the assistance of patient and her daughter who was at bedside.  Patient has had several days of increasing edema.  She had has noted some mild wooziness and just not feeling herself yesterday along with fatigue when doing her usual activities.  She noted that she was short of breath today her daughter checked her pulse ox and her saturations were in the 70s to 80s on room air.  This prompted the daughter to call EMS to bring patient to [place] for further evaluation.  They deny any changes in her diet or intake.  Patient denies orthopnea but states that she has always slept on 2 pillows.  Patient does not use tobacco or alcohol.  She denies chest pain, abdominal pain, constipation, diarrhea, fever.  ED Course: Vital signs notable for heart rate in the 40s to 50s, blood pressure in the 254Y to 706C systolic.  CMP showed creatinine of 1.6 which is stable, glucose of 180.  CBC showed hemoglobin of 10.8 which is stable per family report.  Normal PT and INR.  Lipase normal, lactic acid normal.  BNP elevated at 1000, troponin trend flat at 18 and 19.  VBG within normal limits.  Respiratory panel for flu and Covid negative.  Chest x-ray showed bilateral pleural effusions and chest CT showed moderate right effusion and small to moderate left effusion with pulmonary congestion and cardiomegaly.  Patient received a dose of 40 mg IV Lasix in ED.  Review of Systems: As per HPI otherwise all other systems reviewed and are negative.  Past Medical History:  Diagnosis Date  . Coronary  artery disease   . DM (diabetes mellitus) ([place])   . HTN (hypertension)   . Hyperlipidemia   . Myocardial infarct ([place]) 2012  . Renal disorder     Past Surgical History:  Procedure Laterality Date  . CARDIAC CATHETERIZATION    . CAROTID ENDARTERECTOMY    . CORONARY ANGIOPLASTY    . CORONARY STENT PLACEMENT      Social History  reports that she quit smoking about 50 years ago. Her smoking use included cigarettes. She has a 2.00 pack-year smoking history. She has never used smokeless tobacco. She reports that she does not drink alcohol and does not use drugs.  Allergies  Allergen Reactions  . Lisinopril     cough    Family History  Problem Relation Age of Onset  . Alzheimer's disease Mother   . Heart attack Father   . Heart disease Father   . Lymphoma Sister   . Heart failure Brother   Reviewed on admission  Prior to Admission medications   Medication Sig Start Date End Date Taking? Authorizing Provider  Biotin 5000 MCG TABS Take 5,000 mcg by mouth daily.   Yes [provider]  Cholecalciferol (VITAMIN D3 SUPER STRENGTH) 50 MCG (2000 UT) TABS Take 2,000 Units by mouth daily.   Yes [provider]  clopidogrel (PLAVIX) 75 MG tablet Take 75 mg by mouth 3 (three) times a week. Take Mon, Wed, Fri   Yes [provider]  donepezil (ARICEPT) 10 MG tablet Take  1 tablet (10 mg total) by mouth at bedtime. 07/22/20  Yes Star Age, MD  levothyroxine (SYNTHROID) 25 MCG tablet Take 25 mcg by mouth daily before breakfast.   Yes [provider]  losartan (COZAAR) 100 MG tablet Take 100 mg by mouth every evening.   Yes [provider]  Metoprolol Tartrate 75 MG TABS Take 75 mg by mouth daily.    Yes [provider]  pioglitazone (ACTOS) 15 MG tablet Take 15 mg by mouth daily.   Yes [provider]  rosuvastatin (CRESTOR) 40 MG tablet Take 40 mg by mouth daily.   Yes [provider]  sertraline (ZOLOFT) 100 MG tablet  Take 100 mg by mouth at bedtime.   Yes [provider]    Physical Exam: Vitals:   08/11/20 1530 08/11/20 1615 08/11/20 1745 08/11/20 1843  BP: (!) 169/65 (!) 167/92 (!) 150/63 (!) 194/63  Pulse: (!) 48 (!) 52 (!) 49 (!) 52  Resp: 16 (!) 26 18 20   Temp:    98.7 F (37.1 C)  TempSrc:    Oral  SpO2: 96% 92% 94% 99%  Weight:    53.8 kg  Height:    5\' 7"  (1.702 m)   Physical Exam Constitutional:      General: She is not in acute distress.    Appearance: Normal appearance.     Comments: Pleasant elderly female On 2L [place]  HENT:     Head: Normocephalic and atraumatic.     Mouth/Throat:     Mouth: Mucous membranes are moist.     Pharynx: Oropharynx is clear.  Eyes:     Extraocular Movements: Extraocular movements intact.     Pupils: Pupils are equal, round, and reactive to light.  Cardiovascular:     Rate and Rhythm: Normal rate and regular rhythm.     Pulses: Normal pulses.     Heart sounds: Normal heart sounds.  Pulmonary:     Effort: Pulmonary effort is normal. No respiratory distress.     Comments: Absent breath sounds at bilateral bases Rales bilaterally to mid back Abdominal:     General: Bowel sounds are normal. There is no distension.     Palpations: Abdomen is soft.     Tenderness: There is no abdominal tenderness.  Musculoskeletal:        General: No swelling or deformity.     Right lower leg: No edema.     Left lower leg: No edema.  Skin:    General: Skin is warm and dry.  Neurological:     General: No focal deficit present.     Mental Status: Mental status is at baseline.    Labs on Admission: I have personally reviewed following labs and imaging studies  CBC: Recent Labs  Lab 08/11/20 1359 08/11/20 1410  WBC 7.6  --   NEUTROABS 6.0  --   HGB 10.8* 10.5*  HCT 33.8* 31.0*  MCV 102.7*  --   PLT 375  --     Basic Metabolic Panel: Recent Labs  Lab 08/11/20 1359 08/11/20 1410  NA 138 141  K 3.7 3.9  CL 105  --   CO2 23  --   GLUCOSE  187*  --   BUN 23  --   CREATININE 1.60*  --   CALCIUM 9.2  --     GFR: Estimated Creatinine Clearance: 21.4 mL/min (A) (by C-G formula based on SCr of 1.6 mg/dL (H)).  Liver Function Tests: Recent Labs  Lab  08/11/20 1359  AST 22  ALT 29  ALKPHOS 70  BILITOT 0.8  PROT 7.2  ALBUMIN 3.7    Urine analysis: No results found for: COLORURINE, APPEARANCEUR, LABSPEC, PHURINE, GLUCOSEU, HGBUR, BILIRUBINUR, KETONESUR, PROTEINUR, UROBILINOGEN, NITRITE, LEUKOCYTESUR  Radiological Exams on Admission: CT CHEST WO CONTRAST  Result Date: 08/11/2020 CLINICAL DATA:  Cardiomegaly. Follow-up. Patient felt tired today. Wheezing and cough. EXAM: CT CHEST WITHOUT CONTRAST TECHNIQUE: Multidetector CT imaging of the chest was performed following the standard protocol without IV contrast. COMPARISON:  Chest x-ray August 11, 2020.  Chest CT March 16, 2020. FINDINGS: Cardiovascular: Cardiomegaly. Three-vessel coronary artery disease. The thoracic aorta demonstrates moderate atherosclerotic change without aneurysm. Central pulmonary arteries are unremarkable. Mediastinum/Nodes: There is a moderate right-sided pleural effusion which appears to be free-flowing. There is a small to moderate left-sided pleural effusion with loculated components. There is a tiny pericardial effusion. The patient appears to be status post resection of the left thyroid lobe. The remaining right thyroid lobe is grossly unremarkable. The esophagus is normal. No adenopathy. The chest wall is normal in appearance. Lungs/Pleura: There is atelectasis associated with the bilateral effusions. Scarring in the apices is stable. Minimal interlobular septal thickening suggests pulmonary venous congestion. No evidence of pneumonia. No nodule or mass. Upper Abdomen: No acute abnormality. Musculoskeletal: No chest wall mass or suspicious bone lesions identified. IMPRESSION: 1. There is a moderate free-flowing right-sided pleural effusion. 2. There is a  small to moderate left-sided pleural effusion with loculated components. 3. Cardiomegaly and mild pulmonary venous congestion. 4. Thoracic aortic atherosclerosis.  Coronary artery disease. 5. No other acute abnormalities. Aortic Atherosclerosis (ICD10-I70.0). Electronically Signed   By: Dorise Bullion III M.D   On: 08/11/2020 17:19   DG Chest Port 1 View  Result Date: 08/11/2020 CLINICAL DATA:  Fatigue, hypoxia, wheezing, cough EXAM: PORTABLE CHEST 1 VIEW COMPARISON:  03/19/2020 FINDINGS: There are moderate bilateral layering pleural effusions and associated atelectasis or consolidation. Cardiomegaly. IMPRESSION: 1. There are moderate bilateral layering pleural effusions and associated atelectasis or consolidation. 2. Cardiomegaly. Electronically Signed   By: Eddie Candle M.D.   On: 08/11/2020 14:56    EKG: Independently reviewed.  Sinus bradycardia  Assessment/Plan Principal Problem:   CHF (congestive heart failure) (HCC) Active Problems:   Pleural effusion   CKD (chronic kidney disease), stage III (HCC)   Essential hypertension   HLD (hyperlipidemia)   Type 2 diabetes mellitus without complication (HCC)   Memory impairment   CAD (coronary artery disease)   History of heart artery stent  New onset CHF in exacerbation > No prior history of CHF, though cardiomegaly was noted on chest x-rays for effusion earlier this year. > Increased edema, DOE as per HPI.  BNP 1000, pulmonary edema, bilateral pleural effusions. > Good output with 4 mg IV Lasix in ED, no further edema on repeat exam - Continue Lasix 40 mg twice daily - Echocardiogram - Supplemental O2 as needed - Will need repeat evaluation of effusions to see if improving - Daily weights - Strict I/Os - Follow BMP  Bilateral pleural effusions > History of recent left-sided effusion status post Pleurx which was secondary to chest wall contusion last chest x-ray June 29 showed only trace bilateral effusions. > Current effusion  suspected to be from CHF - Monitor response to diuresis, may need thoracentesis if not responding -Contiune supplemental oxygen as needed  Hypertension Hyperlipidemia CAD status post stent x2 > No chest pain, troponin flat at 18 and 19 - Continue home losartan, rosuvastatin,  Plavix - Holding metoprolol in the setting of bradycardia   CKD III > Creatinine stable at 1.6 - Avoid nephrotoxic agents - Trend renal function  Diabetes - Hold p.o. meds - SSI   Memory impairment  - Continue home Aricept - Delirium precautions  DVT prophylaxis: Lovenox  Code Status:   Full  Family Communication:  Daughter, Sherrie Mustache, updated at bedside  Disposition Plan:   Patient is from:  Home  Anticipated DC to:  Home  Anticipated DC date:  Pending clinical course  Anticipated DC barriers: None  Consults called:  None  Admission status:  Inpatient, telemetry   Severity of Illness: The appropriate patient status for this patient is INPATIENT. Inpatient status is judged to be reasonable and necessary in order to provide the required intensity of service to ensure the patient's safety. The patient's presenting symptoms, physical exam findings, and initial radiographic and laboratory data in the context of their chronic comorbidities is felt to place them at high risk for further clinical deterioration. Furthermore, it is not anticipated that the patient will be medically stable for discharge from the hospital within 2 midnights of admission. The following factors support the patient status of inpatient.   " The patient's presenting symptoms include dyspnea, edema, hypoxia. " The worrisome physical exam findings include rales, decreased breath sounds. " The initial radiographic and laboratory data are worrisome because of BNP of 1000. " The chronic co-morbidities include CAD, diabetes, hypertension, hyperlipidemia, CKD.   * I certify that at the point of admission it is my clinical judgment that the  patient will require inpatient hospital care spanning beyond 2 midnights from the point of admission due to high intensity of service, high risk for further deterioration and high frequency of surveillance required.Marcelyn Bruins MD [facility]  How to contact the [facility] Attending or Consulting provider [place] or covering provider during after hours [place], for this patient?   1. Check the care team in [facility] and look for a) attending/consulting [facility] provider listed and b) the [facility] team listed 2. Log into [email or web address] and use [place]'s universal password to access. If you do not have the password, please contact the hospital operator. 3. Locate the [facility] provider you are looking for under [facility] and page to a number that you can be directly reached. 4. If you still have difficulty reaching the provider, please page the [facility] (Director on Call) for the Hospitalists listed on amion for assistance.  08/11/2020, 8:07 PM

## 2020-08-11 NOTE — ED Provider Notes (Signed)
[facility] EMERGENCY DEPARTMENT Provider Note   CSN: [id number] Arrival date & time: 08/11/20  1332     History Chief Complaint  Patient presents with  . Shortness of Breath    [name] is a 86 y.o. female.  HPI Patient complained of some mild "wooziness" yesterday.  She was up doing usual activities.  She had some slight additional fatigue.  Today she had some complaints of shortness of breath.  Patient's daughter was checking her pulse oximetry and identified that her oxygen level was from 78 to mid 80s.  Patient has not had fever, myalgias, productive cough.  She has been immunized for Covid and had her booster several weeks ago.  She does get swelling in her legs.  Patient has noted some additional swelling in her ankles over the past several days.  Patient's Cozaar dose was decreased by half a few days ago due to low blood pressure.  No other recent medication changes.  Patient does not take a diuretic.    Past Medical History:  Diagnosis Date  . Coronary artery disease   . DM (diabetes mellitus) ([place])   . HTN (hypertension)   . Hyperlipidemia   . Myocardial infarct ([place]) 2012  . Renal disorder     Patient Active Problem List   Diagnosis Date Noted  . Pleural effusion, left 03/19/2020    Past Surgical History:  Procedure Laterality Date  . CARDIAC CATHETERIZATION    . CAROTID ENDARTERECTOMY    . CORONARY ANGIOPLASTY    . CORONARY STENT PLACEMENT       OB History   No obstetric history on file.     Family History  Problem Relation Age of Onset  . Alzheimer's disease Mother   . Heart attack Father   . Heart disease Father   . Lymphoma Sister   . Heart failure Brother     Social History   Tobacco Use  . Smoking status: Former Smoker    Packs/day: 1.00    Years: 2.00    Pack years: 2.00    Types: Cigarettes    Quit date: 11/15/1969    Years since quitting: 50.7  . Smokeless tobacco: Never Used  Substance Use Topics  . Alcohol  use: Never  . Drug use: Never    Home Medications Prior to Admission medications   Medication Sig Start Date End Date Taking? Authorizing Provider  Biotin 5000 MCG TABS Take 5,000 mcg by mouth daily.   Yes [provider]  Cholecalciferol (VITAMIN D3 SUPER STRENGTH) 50 MCG (2000 UT) TABS Take 2,000 Units by mouth daily.   Yes [provider]  clopidogrel (PLAVIX) 75 MG tablet Take 75 mg by mouth 3 (three) times a week. Take Mon, Wed, Fri   Yes [provider]  donepezil (ARICEPT) 10 MG tablet Take 1 tablet (10 mg total) by mouth at bedtime. 07/22/20  Yes Star Age, MD  levothyroxine (SYNTHROID) 25 MCG tablet Take 25 mcg by mouth daily before breakfast.   Yes [provider]  losartan (COZAAR) 100 MG tablet Take 100 mg by mouth every evening.   Yes [provider]  Metoprolol Tartrate 75 MG TABS Take 75 mg by mouth daily.    Yes [provider]  pioglitazone (ACTOS) 15 MG tablet Take 15 mg by mouth daily.   Yes [provider]  rosuvastatin (CRESTOR) 40 MG tablet Take 40 mg by mouth daily.   Yes [provider]  sertraline (ZOLOFT) 100 MG tablet  Take 100 mg by mouth at bedtime.   Yes [provider]    Allergies    Lisinopril  Review of Systems   Review of Systems 10 systems reviewed and negative except as per HPI Physical Exam Updated Vital Signs BP (!) 169/63 (BP Location: Left Arm)   Pulse (!) 50   Temp 98.2 F (36.8 C) (Oral)   Resp (!) 22   Ht 5\' 7"  (1.702 m)   Wt 54.4 kg   SpO2 94%   BMI 18.79 kg/m   Physical Exam Constitutional:      Comments: Patient is alert and interactive.  No distress at rest.  HENT:     Head: Normocephalic and atraumatic.     Mouth/Throat:     Pharynx: Oropharynx is clear.  Eyes:     Extraocular Movements: Extraocular movements intact.  Cardiovascular:     Comments: Bradycardia.  No gross rub murmur gallop. Pulmonary:     Comments: Fine crackles lower to  mid lung fields.  Soft breath sounds/diminished at bases. Abdominal:     General: There is no distension.     Palpations: Abdomen is soft.     Tenderness: There is no abdominal tenderness. There is no guarding.  Musculoskeletal:     Comments: 2+ pitting edema bilateral lower extremities.  Feet are warm and dry.  Calves soft and nontender.  Skin:    General: Skin is warm and dry.  Neurological:     General: No focal deficit present.     Coordination: Coordination normal.  Psychiatric:        Mood and Affect: Mood normal.     ED Results / Procedures / Treatments   Labs (all labs ordered are listed, but only abnormal results are displayed) Labs Reviewed  COMPREHENSIVE METABOLIC PANEL - Abnormal; Notable for the following components:      Result Value   Glucose, Bld 187 (*)    Creatinine, Ser 1.60 (*)    GFR, Estimated 31 (*)    All other components within normal limits  BRAIN NATRIURETIC PEPTIDE - Abnormal; Notable for the following components:   B Natriuretic Peptide 1,055.0 (*)    All other components within normal limits  CBC WITH DIFFERENTIAL/PLATELET - Abnormal; Notable for the following components:   RBC 3.29 (*)    Hemoglobin 10.8 (*)    HCT 33.8 (*)    MCV 102.7 (*)    All other components within normal limits  I-STAT VENOUS BLOOD GAS, ED - Abnormal; Notable for the following components:   HCT 31.0 (*)    Hemoglobin 10.5 (*)    All other components within normal limits  TROPONIN I (HIGH SENSITIVITY) - Abnormal; Notable for the following components:   Troponin I (High Sensitivity) 18 (*)    All other components within normal limits  RESP PANEL BY RT-PCR (FLU A&B, COVID) ARPGX2  LIPASE, BLOOD  LACTIC ACID, PLASMA  PROTIME-INR  LACTIC ACID, PLASMA  URINALYSIS, ROUTINE W REFLEX MICROSCOPIC  BLOOD GAS, VENOUS    EKG EKG Interpretation  Date/Time:  Sunday August 11 2020 13:45:36 EST Ventricular Rate:  52 PR Interval:    QRS Duration: 79 QT  Interval:  508 QTC Calculation: 473 R Axis:   78 Text Interpretation: Sinus rhythm Short PR interval Minimal ST depression, lateral leads no sig change from previous Confirmed by Charlesetta Shanks [phone]) on 08/11/2020 2:38:12 PM   Radiology DG Chest Port 1 View  Result Date: 08/11/2020 CLINICAL DATA:  Fatigue, hypoxia, wheezing, cough  EXAM: PORTABLE CHEST 1 VIEW COMPARISON:  03/19/2020 FINDINGS: There are moderate bilateral layering pleural effusions and associated atelectasis or consolidation. Cardiomegaly. IMPRESSION: 1. There are moderate bilateral layering pleural effusions and associated atelectasis or consolidation. 2. Cardiomegaly. Electronically Signed   By: Eddie Candle M.D.   On: 08/11/2020 14:56    Procedures Procedures (including critical care time) CRITICAL CARE Performed by: Charlesetta Shanks   Total critical care time: 30 minutes  Critical care time was exclusive of separately billable procedures and treating other patients.  Critical care was necessary to treat or prevent imminent or life-threatening deterioration.  Critical care was time spent personally by me on the following activities: development of treatment plan with patient and/or surrogate as well as nursing, discussions with consultants, evaluation of patient's response to treatment, examination of patient, obtaining history from patient or surrogate, ordering and performing treatments and interventions, ordering and review of laboratory studies, ordering and review of radiographic studies, pulse oximetry and re-evaluation of patient's condition. Medications Ordered in ED Medications - No data to display  ED Course  I have reviewed the triage vital signs and the nursing notes.  Pertinent labs & imaging results that were available during my care of the patient were reviewed by me and considered in my medical decision making (see chart for details).    MDM Rules/Calculators/A&P                         Patient  presents as outlined above.  I did trial her off of oxygen and saturations dropped down to 82% on room air.  Good waveform.  Heart rate is bradycardic, narrow complex.  Patient does not describe any chest pain or pressure.  Exam does reveal crackles and peripheral edema, will evaluate for pneumonia, CHF, Covid, pleural effusion, cardiac ischemic disease.  Patient's mental status is alert and she is not exhibiting clinical appearance of respiratory distress.  Stable for ongoing diagnostic evaluation.  With new hypoxia and no prior oxygen dependent conditions, anticipate admission for hypoxic respiratory failure.  Patient and her daughter informed of likely outcome and we discussed potential differential diagnosis.  Final Clinical Impression(s) / ED Diagnoses Final diagnoses:  Hypoxia  Acute on chronic congestive heart failure, unspecified heart failure type [facility])    Rx / DC Orders ED Discharge Orders    None       Charlesetta Shanks, MD 08/11/20 1516

## 2020-08-11 NOTE — ED Notes (Signed)
Assisted patient to [facility] commode and back into bed.

## 2020-08-11 NOTE — ED Notes (Signed)
Assisted patient to [facility] commode and back into bed.  Applied Purewick to suction cannister for patient safety.

## 2020-08-11 NOTE — ED Notes (Signed)
Assisted patient to [facility] commode and Back into bed

## 2020-08-11 NOTE — ED Notes (Signed)
Hospitalist consult called via [place], spoke with Ruby

## 2020-08-12 ENCOUNTER — Inpatient Hospital Stay (HOSPITAL_COMMUNITY): Payer: Medicare Other

## 2020-08-12 DIAGNOSIS — I340 Nonrheumatic mitral (valve) insufficiency: Secondary | ICD-10-CM | POA: Diagnosis not present

## 2020-08-12 DIAGNOSIS — I509 Heart failure, unspecified: Secondary | ICD-10-CM | POA: Diagnosis not present

## 2020-08-12 DIAGNOSIS — R0600 Dyspnea, unspecified: Secondary | ICD-10-CM | POA: Diagnosis not present

## 2020-08-12 DIAGNOSIS — I361 Nonrheumatic tricuspid (valve) insufficiency: Secondary | ICD-10-CM

## 2020-08-12 LAB — GLUCOSE, CAPILLARY
Glucose-Capillary: 142 mg/dL — ABNORMAL HIGH (ref 70–99)
Glucose-Capillary: 115 mg/dL — ABNORMAL HIGH (ref 70–99)
Glucose-Capillary: 133 mg/dL — ABNORMAL HIGH (ref 70–99)
Glucose-Capillary: 138 mg/dL — ABNORMAL HIGH (ref 70–99)

## 2020-08-12 LAB — CBC
HCT: 28.6 % — ABNORMAL LOW (ref 36.0–46.0)
Hemoglobin: 9.2 g/dL — ABNORMAL LOW (ref 12.0–15.0)
MCH: 33 pg (ref 26.0–34.0)
MCHC: 32.2 g/dL (ref 30.0–36.0)
MCV: 102.5 fL — ABNORMAL HIGH (ref 80.0–100.0)
Platelets: 314 10*3/uL (ref 150–400)
RBC: 2.79 MIL/uL — ABNORMAL LOW (ref 3.87–5.11)
RDW: 15.1 % (ref 11.5–15.5)
WBC: 7.6 10*3/uL (ref 4.0–10.5)
nRBC: 0 % (ref 0.0–0.2)

## 2020-08-12 LAB — BASIC METABOLIC PANEL
Anion gap: 13 (ref 5–15)
BUN: 22 mg/dL (ref 8–23)
CO2: 26 mmol/L (ref 22–32)
Calcium: 9 mg/dL (ref 8.9–10.3)
Chloride: 101 mmol/L (ref 98–111)
Creatinine, Ser: 1.88 mg/dL — ABNORMAL HIGH (ref 0.44–1.00)
GFR, Estimated: 26 mL/min — ABNORMAL LOW (ref >60)
Glucose, Bld: 125 mg/dL — ABNORMAL HIGH (ref 70–99)
Potassium: 3.4 mmol/L — ABNORMAL LOW (ref 3.5–5.1)
Sodium: 140 mmol/L (ref 135–145)

## 2020-08-12 LAB — ECHOCARDIOGRAM COMPLETE
AR max vel: 1.93 cm2
AV Area VTI: 2 cm2
AV Area mean vel: 1.87 cm2
AV Mean grad: 5 mmHg
AV Peak grad: 10.4 mmHg
Ao pk vel: 1.61 m/s
Area-P 1/2: 3.21 cm2
Height: 67 in
MV M vel: 5.79 m/s
MV Peak grad: 134.1 mmHg
S' Lateral: 2.7 cm
Weight: 1828.8 oz

## 2020-08-12 LAB — MAGNESIUM: Magnesium: 2.2 mg/dL (ref 1.7–2.4)

## 2020-08-12 MED ORDER — POTASSIUM CHLORIDE CRYS ER 20 MEQ PO TBCR
40.0000 meq | EXTENDED_RELEASE_TABLET | Freq: Once | ORAL | Status: AC
  Administered 2020-08-12: 40 meq via ORAL
  Filled 2020-08-12: qty 2

## 2020-08-12 MED ORDER — FUROSEMIDE 10 MG/ML IJ SOLN: 40.0000 mg | Freq: Every day | INTRAMUSCULAR | Status: DC

## 2020-08-12 NOTE — Progress Notes (Signed)
PROGRESS NOTE    [name]  ZOX:[id number] DOB: 1932/05/10 DOA: 08/11/2020 PCP: Karina Boston, MD   Chief Complaint  Patient presents with  . Shortness of Breath    Brief Narrative: 86 year old female with history of CAD history of stent x2, diabetes, hypertension, hyperlipidemia, kidney disease, memory impairment presented with worsening shortness of breath and edema, fatigue, not feeling herself, hypoxia with pulse ox 70 to 80s on room air.  Patient is a poor historian and daughter was helping with the history,as per report:  Patient has had several days of increasing edema.  She had has noted some mild wooziness and just not feeling herself 08/10/20 along with fatigue when doing her usual activities.She noted that she was short of breath 07/2120,her daughter checked her pulse ox and her saturations were in the 70s to 80s on room air.This prompted the daughter to call EMS to bring patient to [place] for further evaluation.  They deny any changes in her diet or intake.  Patient denies orthopnea but states that she has always slept on 2 pillows.  Patient does not use tobacco or alcohol.  She denies chest pain, abdominal pain, constipation, diarrhea, fever.  ED Course:Vital signs notable for heart rate in the 40s to 50s, blood pressure in the 811B to 147W systolic.  CMP showed creatinine of 1.6 which is stable, glucose of 180.  CBC showed hemoglobin of 10.8 which is stable per family report.Normal PT and INR.  Lipase normal, lactic acid normal.  BNP elevated at 1000, troponin trend flat at 18 and 19.  VBG within normal limits.  Respiratory panel for flu and Covid negative.  Chest x-ray showed bilateral pleural effusions and chest CT showed moderate right effusion and small to moderate left effusion with pulmonary congestion and cardiomegaly.  Patient received a dose of 40 mg IV Lasix in ED.  Subjective: Seen/examined, Net negative 2.4 L.Wt down 120->114.3 lb,Heart rate in  50s,saturating well on nasal cannula Leg edema improved On [place] at 1l Daughter at bedside  Assessment & Plan:  CHF new onset w/ preserved LVEF, bnp in 1055,imaging showing pleural effusion with pulmonary congestion, patient symptomatic with shortness of breath edema and hypoxia. Good uop and wt down.F/U echo as below w/ normal lvef ( see below).Monitor I/O, will hold lasix given elevated creatinine- bmp re-check in am before dosing lasix.Continue supplemental oxygen.  Bilateral pleural effusion with recent left-sided Pleurx status secondary to chest wall contusion/rib fractures,x-ray in June showed only trace bilateral effusion.  Continue diuresis as above and follow-up imaging if does not improve may need thoracentesis  Acute hypoxic respiratory failure due to #1/#2: Continue supplemental oxygen, diuresis and monitor respiratory status.  CKD stage IIIb:Creatinine baseline around 1.6 in oct 2021,close to baseline, slightly up 1.7-1.8.  Closely while on diuresis.  Hypokalemia-replete,potassium kcl.  CAD with history of stent/essential hypertension/HLD: No chest pain troponin flat 18, 19, continue home losartan, Plavix, rosuvastatin.  Holding metoprolol due to bradycardia  Type 2 diabetes mellitus without complication hemoglobin G9F 7.4 stable on 10/4.Blood sugar stable on sliding scale insulin, holding oral meds. Recent Labs  Lab 08/11/20 2138 08/12/20 0624 08/12/20 1155  GLUCAP 177* 142* 115*   Memory impairment home Aricept:continue delirium precaution supportive care, fall precaution  Nutrition: Diet Order            Diet heart healthy/carb modified Room service appropriate? Yes; Fluid consistency: Thin; Fluid restriction: 1500 mL Fluid  Diet effective now  Body mass index is 17.9 kg/m.  DVT prophylaxis: enoxaparin (LOVENOX) injection 30 mg Start: 08/11/20 2000 Code Status:   Code Status: Full Code  Family Communication: plan of care discussed with patient and  her daughter at bedside.  Status is: Inpatient Remains inpatient appropriate because:IV treatments appropriate due to intensity of illness or inability to take PO and Inpatient level of care appropriate due to severity of illness  Dispo: The patient is from: Home              Anticipated d/c is to: Home              Anticipated d/c date is: 1 day              Patient currently is not medically stable to d/c.  Consultants:see note  Procedures:see note  Echo: 1.Left ventricular ejection fraction, by estimation, is 55 to 60%. The  left ventricle has normal function. The left ventricle has no regional  wall motion abnormalities. There is mild left ventricular hypertrophy.  Left ventricular diastolic parameters  are indeterminate.  2. Right ventricular systolic function is normal. The right ventricular  size is normal. There is moderately elevated pulmonary artery systolic  pressure. The estimated right ventricular systolic pressure is 89.3 mmHg.  3. Trivial to small pericardial effusion. The pericardial effusion is  circumferential. There is no evidence of cardiac tamponade.  4. The mitral valve is abnormal. Mild mitral valve regurgitation.  5. The aortic valve is tricuspid. Aortic valve regurgitation is not  visualized. Mild aortic valve sclerosis is present, with no evidence of  aortic valve stenosis.  6. The inferior vena cava is normal in size with greater than 50%  respiratory variability, suggesting right atrial pressure of 3 mmHg.  Culture/Microbiology No results found for: SDES, SPECREQUEST, CULT, REPTSTATUS  Other culture-see note  Medications: Scheduled Meds: . clopidogrel  75 mg Oral Once per day on Mon Wed Fri  . donepezil  10 mg Oral QHS  . enoxaparin (LOVENOX) injection  30 mg Subcutaneous Q24H  . [START ON 08/13/2020] furosemide  40 mg Intravenous Daily  . insulin aspart  0-9 Units Subcutaneous TID WC  . levothyroxine  25 mcg Oral QAC breakfast  . losartan   50 mg Oral QPM  . rosuvastatin  40 mg Oral q1800  . sertraline  100 mg Oral QHS  . sodium chloride flush  3 mL Intravenous Q12H   Continuous Infusions:  Antimicrobials: Anti-infectives (From admission, onward)   None     Objective: Vitals: Today's Vitals   08/12/20 0333 08/12/20 0433 08/12/20 0840 08/12/20 0904  BP: (!) 158/69  (!) 146/60   Pulse: (!) 50  (!) 52   Resp: 17  17   Temp: 98.7 F (37.1 C)  97.9 F (36.6 C)   TempSrc: Oral     SpO2: 98%  99%   Weight:      Height:      PainSc:  Asleep  0-No pain    Intake/Output Summary (Last 24 hours) at 08/12/2020 1337 Last data filed at 08/12/2020 1330 Gross per 24 hour  Intake 460 ml  Output 3750 ml  Net -3290 ml   Filed Weights   08/11/20 1345 08/11/20 1843 08/12/20 0203  Weight: 54.4 kg 53.8 kg 51.8 kg   Weight change:   Intake/Output from previous day: 11/21 0701 - 11/22 0700 In: 240 [P.O.:240] Out: 2050 [Urine:2050] Intake/Output this shift: Total I/O In: 220 [P.O.:220] Out: 1700 [Urine:1700]  Examination: General exam:  AAO,NAD,weak appearing. HEENT:Oral mucosa moist,Ear/Nose WNL grossly,dentition normal. Respiratory system:Bilaterally basal crackles,no use of accessory muscle, non tender. Cardiovascular system:S1 & S2 +,regular,No JVD. Gastrointestinal system:Abdomen soft,NT,ND, BS+. Nervous System:Alert, awake, moving extremities and grossly non-focal. Extremities:No edema,distal peripheral pulses palpable.  Skin:No rashes,no icterus. UUV:OZDGUY muscle bulk,tone, power.  Data Reviewed: I have personally reviewed following labs and imaging studies CBC: Recent Labs  Lab 08/11/20 1359 08/11/20 1410 08/12/20 0419  WBC 7.6  --  7.6  NEUTROABS 6.0  --   --   HGB 10.8* 10.5* 9.2*  HCT 33.8* 31.0* 28.6*  MCV 102.7*  --  102.5*  PLT 375  --  403   Basic Metabolic Panel: Recent Labs  Lab 08/11/20 1359 08/11/20 1410 08/12/20 0419  NA 138 141 140  K 3.7 3.9 3.4*  CL 105  --  101  CO2 23  --   26  GLUCOSE 187*  --  125*  BUN 23  --  22  CREATININE 1.60*  --  1.88*  CALCIUM 9.2  --  9.0  MG  --   --  2.2   GFR: Estimated Creatinine Clearance: 17.6 mL/min (A) (by C-G formula based on SCr of 1.88 mg/dL (H)). Liver Function Tests: Recent Labs  Lab 08/11/20 1359  AST 22  ALT 29  ALKPHOS 70  BILITOT 0.8  PROT 7.2  ALBUMIN 3.7   Recent Labs  Lab 08/11/20 1359  LIPASE 51   No results for input(s): AMMONIA in the last 168 hours. Coagulation Profile: Recent Labs  Lab 08/11/20 1359  INR 1.1   Cardiac Enzymes: No results for input(s): CKTOTAL, CKMB, CKMBINDEX, TROPONINI in the last 168 hours. BNP (last 3 results) No results for input(s): PROBNP in the last 8760 hours. HbA1C: No results for input(s): HGBA1C in the last 72 hours. CBG: Recent Labs  Lab 08/11/20 2138 08/12/20 0624 08/12/20 1155  GLUCAP 177* 142* 115*   Lipid Profile: No results for input(s): CHOL, HDL, LDLCALC, TRIG, CHOLHDL, LDLDIRECT in the last 72 hours. Thyroid Function Tests: No results for input(s): TSH, T4TOTAL, FREET4, T3FREE, THYROIDAB in the last 72 hours. Anemia Panel: No results for input(s): VITAMINB12, FOLATE, FERRITIN, TIBC, IRON, RETICCTPCT in the last 72 hours. Sepsis Labs: Recent Labs  Lab 08/11/20 1359  LATICACIDVEN 1.3    Recent Results (from the past 240 hour(s))  Resp Panel by RT-PCR (Flu A&B, Covid) Nasopharyngeal Swab     Status: None   Collection Time: 08/11/20  2:16 PM   Specimen: Nasopharyngeal Swab; Nasopharyngeal(NP) swabs in vial transport medium  Result Value Ref Range Status   SARS Coronavirus 2 by RT PCR NEGATIVE NEGATIVE Final    Comment: (NOTE) SARS-CoV-2 target nucleic acids are NOT DETECTED.  The SARS-CoV-2 RNA is generally detectable in upper respiratory specimens during the acute phase of infection. The lowest concentration of SARS-CoV-2 viral copies this assay can detect is 138 copies/mL. A negative result does not preclude  SARS-Cov-2 infection and should not be used as the sole basis for treatment or other patient management decisions. A negative result may occur with  improper specimen collection/handling, submission of specimen other than nasopharyngeal swab, presence of viral mutation(s) within the areas targeted by this assay, and inadequate number of viral copies(<138 copies/mL). A negative result must be combined with clinical observations, patient history, and epidemiological information. The expected result is Negative.  Fact Sheet for Patients:  EntrepreneurPulse.com.au  Fact Sheet for Healthcare Providers:  IncredibleEmployment.be  This test is no t yet approved or cleared  by the [place] and  has been authorized for detection and/or diagnosis of SARS-CoV-2 by FDA under an Emergency Use Authorization (EUA). This EUA will remain  in effect (meaning this test can be used) for the duration of the COVID-19 declaration under Section 564(b)(1) of the Act, 21 U.S.C.section 360bbb-3(b)(1), unless the authorization is terminated  or revoked sooner.       Influenza A by PCR NEGATIVE NEGATIVE Final   Influenza B by PCR NEGATIVE NEGATIVE Final    Comment: (NOTE) The Xpert Xpress SARS-CoV-2/FLU/RSV plus assay is intended as an aid in the diagnosis of influenza from Nasopharyngeal swab specimens and should not be used as a sole basis for treatment. Nasal washings and aspirates are unacceptable for Xpert Xpress SARS-CoV-2/FLU/RSV testing.  Fact Sheet for Patients: EntrepreneurPulse.com.au  Fact Sheet for Healthcare Providers: IncredibleEmployment.be  This test is not yet approved or cleared by the [place] FDA and has been authorized for detection and/or diagnosis of SARS-CoV-2 by FDA under an Emergency Use Authorization (EUA). This EUA will remain in effect (meaning this test can be used) for the duration of  the COVID-19 declaration under Section 564(b)(1) of the Act, 21 U.S.C. section 360bbb-3(b)(1), unless the authorization is terminated or revoked.  Performed at [facility], [address], [place], [place] [address]      Radiology Studies: CT CHEST WO CONTRAST  Result Date: 08/11/2020 CLINICAL DATA:  Cardiomegaly. Follow-up. Patient felt tired today. Wheezing and cough. EXAM: CT CHEST WITHOUT CONTRAST TECHNIQUE: Multidetector CT imaging of the chest was performed following the standard protocol without IV contrast. COMPARISON:  Chest x-ray August 11, 2020.  Chest CT March 16, 2020. FINDINGS: Cardiovascular: Cardiomegaly. Three-vessel coronary artery disease. The thoracic aorta demonstrates moderate atherosclerotic change without aneurysm. Central pulmonary arteries are unremarkable. Mediastinum/Nodes: There is a moderate right-sided pleural effusion which appears to be free-flowing. There is a small to moderate left-sided pleural effusion with loculated components. There is a tiny pericardial effusion. The patient appears to be status post resection of the left thyroid lobe. The remaining right thyroid lobe is grossly unremarkable. The esophagus is normal. No adenopathy. The chest wall is normal in appearance. Lungs/Pleura: There is atelectasis associated with the bilateral effusions. Scarring in the apices is stable. Minimal interlobular septal thickening suggests pulmonary venous congestion. No evidence of pneumonia. No nodule or mass. Upper Abdomen: No acute abnormality. Musculoskeletal: No chest wall mass or suspicious bone lesions identified. IMPRESSION: 1. There is a moderate free-flowing right-sided pleural effusion. 2. There is a small to moderate left-sided pleural effusion with loculated components. 3. Cardiomegaly and mild pulmonary venous congestion. 4. Thoracic aortic atherosclerosis.  Coronary artery disease. 5. No other acute abnormalities. Aortic Atherosclerosis  (ICD10-I70.0). Electronically Signed   By: Dorise Bullion III M.D   On: 08/11/2020 17:19   DG Chest Port 1 View  Result Date: 08/11/2020 CLINICAL DATA:  Fatigue, hypoxia, wheezing, cough EXAM: PORTABLE CHEST 1 VIEW COMPARISON:  03/19/2020 FINDINGS: There are moderate bilateral layering pleural effusions and associated atelectasis or consolidation. Cardiomegaly. IMPRESSION: 1. There are moderate bilateral layering pleural effusions and associated atelectasis or consolidation. 2. Cardiomegaly. Electronically Signed   By: Eddie Candle M.D.   On: 08/11/2020 14:56   ECHOCARDIOGRAM COMPLETE  Result Date: 08/12/2020    ECHOCARDIOGRAM REPORT   Patient Name:   [name] Date of Exam: 08/12/2020 Medical Rec #:  [id number]        Height:       67.0 in Accession #:  [id number]       Weight:       114.3 lb Date of Birth:  06-21-1932         BSA:          1.594 m Patient Age:    76 years         BP:           146/60 mmHg Patient Gender: F                HR:           52 bpm. Exam Location:  Inpatient Procedure: 2D Echo, Cardiac Doppler and Color Doppler Indications:    Dyspnea  History:        Patient has no prior history of Echocardiogram examinations. CAD                 and Previous Myocardial Infarction, Signs/Symptoms:Shortness of                 Breath; Risk Factors:Hypertension, Diabetes and Dyslipidemia.                 CKD.  Sonographer:    Clayton Lefort RDCS (AE) Referring Phys: [id number] [place]  1. Left ventricular ejection fraction, by estimation, is 55 to 60%. The left ventricle has normal function. The left ventricle has no regional wall motion abnormalities. There is mild left ventricular hypertrophy. Left ventricular diastolic parameters are indeterminate.  2. Right ventricular systolic function is normal. The right ventricular size is normal. There is moderately elevated pulmonary artery systolic pressure. The estimated right ventricular systolic pressure is 59.5 mmHg.  3.  Trivial to small pericardial effusion. The pericardial effusion is circumferential. There is no evidence of cardiac tamponade.  4. The mitral valve is abnormal. Mild mitral valve regurgitation.  5. The aortic valve is tricuspid. Aortic valve regurgitation is not visualized. Mild aortic valve sclerosis is present, with no evidence of aortic valve stenosis.  6. The inferior vena cava is normal in size with greater than 50% respiratory variability, suggesting right atrial pressure of 3 mmHg. Comparison(s): No prior Echocardiogram. FINDINGS  Left Ventricle: Left ventricular ejection fraction, by estimation, is 55 to 60%. The left ventricle has normal function. The left ventricle has no regional wall motion abnormalities. The left ventricular internal cavity size was normal in size. There is  mild left ventricular hypertrophy. Left ventricular diastolic parameters are indeterminate. Right Ventricle: The right ventricular size is normal. No increase in right ventricular wall thickness. Right ventricular systolic function is normal. There is moderately elevated pulmonary artery systolic pressure. The tricuspid regurgitant velocity is 3.64 m/s, and with an assumed right atrial pressure of 3 mmHg, the estimated right ventricular systolic pressure is 63.8 mmHg. Left Atrium: Left atrial size was normal in size. Right Atrium: Right atrial size was normal in size. Pericardium: Trivial to small pericardial effusion. The pericardial effusion is circumferential. There is no evidence of cardiac tamponade. Mitral Valve: The mitral valve is abnormal. There is moderate calcification of the posterior mitral valve leaflet(s). Mild mitral valve regurgitation. MV peak gradient, 5.1 mmHg. The mean mitral valve gradient is 1.0 mmHg. Tricuspid Valve: The tricuspid valve is grossly normal. Tricuspid valve regurgitation is mild. Aortic Valve: The aortic valve is tricuspid. Aortic valve regurgitation is not visualized. Mild aortic valve  sclerosis is present, with no evidence of aortic valve stenosis. Aortic valve mean gradient measures 5.0 mmHg. Aortic valve peak gradient measures 10.4 mmHg. Aortic valve area,  by VTI measures 2.00 cm. Pulmonic Valve: The pulmonic valve was grossly normal. Pulmonic valve regurgitation is trivial. Aorta: The aortic root and ascending aorta are structurally normal, with no evidence of dilitation. Venous: The inferior vena cava is normal in size with greater than 50% respiratory variability, suggesting right atrial pressure of 3 mmHg. IAS/Shunts: No atrial level shunt detected by color flow Doppler. Additional Comments: There is a small pleural effusion in both left and right lateral regions.  LEFT VENTRICLE PLAX 2D LVIDd:         3.90 cm  Diastology LVIDs:         2.70 cm  LV e' medial:    4.68 cm/s LV PW:         1.10 cm  LV E/e' medial:  21.8 LV IVS:        1.10 cm  LV e' lateral:   4.06 cm/s LVOT diam:     2.00 cm  LV E/e' lateral: 25.1 LV SV:         73 LV SV Index:   46 LVOT Area:     3.14 cm  RIGHT VENTRICLE             IVC RV Basal diam:  2.50 cm     IVC diam: 1.40 cm RV S prime:     11.60 cm/s TAPSE (M-mode): 1.6 cm LEFT ATRIUM             Index       RIGHT ATRIUM           Index LA diam:        4.00 cm 2.51 cm/m  RA Area:     10.40 cm LA Vol (A2C):   50.8 ml 31.86 ml/m RA Volume:   21.50 ml  13.49 ml/m LA Vol (A4C):   48.9 ml 30.67 ml/m LA Biplane Vol: 49.8 ml 31.24 ml/m  AORTIC VALVE AV Area (Vmax):    1.93 cm AV Area (Vmean):   1.87 cm AV Area (VTI):     2.00 cm AV Vmax:           161.00 cm/s AV Vmean:          102.000 cm/s AV VTI:            0.363 m AV Peak Grad:      10.4 mmHg AV Mean Grad:      5.0 mmHg LVOT Vmax:         99.00 cm/s LVOT Vmean:        60.600 cm/s LVOT VTI:          0.231 m LVOT/AV VTI ratio: 0.64  AORTA Ao Root diam: 2.90 cm Ao Asc diam:  3.20 cm MITRAL VALVE                TRICUSPID VALVE MV Area (PHT): 3.21 cm     TR Peak grad:   53.0 mmHg MV Peak grad:  5.1 mmHg     TR  Vmax:        364.00 cm/s MV Mean grad:  1.0 mmHg MV Vmax:       1.13 m/s     SHUNTS MV Vmean:      52.5 cm/s    Systemic VTI:  0.23 m MV Decel Time: 236 msec     Systemic Diam: 2.00 cm MR Peak grad: 134.1 mmHg MR Mean grad: 90.0 mmHg MR Vmax:      579.00 cm/s MR Vmean:  450.0 cm/s MV E velocity: 102.00 cm/s MV A velocity: 52.00 cm/s MV E/A ratio:  1.96 Lyman Bishop MD Electronically signed by Lyman Bishop MD Signature Date/Time: 08/12/2020/12:48:51 PM    Final      LOS: 1 day   Antonieta Pert, MD [facility]  08/12/2020, 1:37 PM

## 2020-08-12 NOTE — Evaluation (Signed)
Physical Therapy Evaluation Patient Details Name: [name] MRN: [id number] DOB: November 09, 1931 Today's Date: 08/12/2020   History of Present Illness  86 y.o. female with medical history significant of CAD status post stent x2, diabetes, hypertension, hyperlipidemia, CKD, memory impairment who presents to ED on 11/21 with worsening shortness of breath, O2 desaturations, bilateral pleural effusions, and edema. Workup for new onset HF in exacerbation. Echo demonstrates EF of 55-60%, mild MVR, and mild pericardial effusion.  Clinical Impression   Pt presents with generalized weakness, mild difficulty performing mobility tasks, impaired dynamic standing balance, and decreased activity tolerance. Pt to benefit from acute PT to address deficits. Pt ambulated room distance with RW and SpO2 99% post-ambulation on 1LO2, pt with no work of breathing during gait. PT to progress mobility as tolerated, and will continue to follow acutely.      Follow Up Recommendations Home health PT;Supervision/Assistance - 24 hour    Equipment Recommendations  None recommended by PT    Recommendations for Other Services       Precautions / Restrictions Precautions Precautions: Fall Restrictions Weight Bearing Restrictions: No      Mobility  Bed Mobility Overal bed mobility: Needs Assistance Bed Mobility: Supine to Sit     Supine to sit: Min assist;HOB elevated     General bed mobility comments: Min assist for trunk elevation, scooting to EOB.    Transfers Overall transfer level: Needs assistance Equipment used: Rolling walker (2 wheeled) Transfers: Sit to/from Stand Sit to Stand: Min assist;From elevated surface         General transfer comment: Min assist for power up, steadying. STS x2, from EOB and recliner.  Ambulation/Gait Ambulation/Gait assistance: Min assist Gait Distance (Feet): 25 Feet Assistive device: Rolling walker (2 wheeled) Gait Pattern/deviations: Step-through  pattern;Decreased stride length;Trunk flexed Gait velocity: decr   General Gait Details: Min assist to steady, physically maneuver RW especially when moving around obstacles or changing direction. SpO2 99% on 1LO2 post-ambulation, with  Stairs            Wheelchair Mobility    Modified Rankin (Stroke Patients Only)       Balance Overall balance assessment: Needs assistance;History of Falls ("stumbles") Sitting-balance support: No upper extremity supported Sitting balance-Leahy Scale: Good Sitting balance - Comments: able to sit EOB without PT support, adjust socks   Standing balance support: Bilateral upper extremity supported;During functional activity Standing balance-Leahy Scale: Poor Standing balance comment: reliant on external support                             Pertinent Vitals/Pain Pain Assessment: 0-10 Pain Score: 0-No pain Pain Intervention(s): Limited activity within patient's tolerance;Monitored during session    [place] expects to be discharged to:: Private residence Living Arrangements: Children Available Help at Discharge: Family;Available 24 hours/day (until next sunday, the 28th) Type of Home: House Home Access: Stairs to enter   [employer] of Steps: 1 Home Layout: One level Home Equipment: Walker - 2 wheels;Cane - single point      Prior Function Level of Independence: Independent with assistive device(s)         Comments: pt reports using cane for ambulation PTA, and occasionally uses RW. Pt no longer drives, daughters take her to appointments     Hand Dominance   Dominant Hand: Right    Extremity/Trunk Assessment   Upper Extremity Assessment Upper Extremity Assessment: Defer to OT evaluation    Lower Extremity Assessment  Lower Extremity Assessment: Generalized weakness    Cervical / Trunk Assessment Cervical / Trunk Assessment: Normal  Communication   Communication: No difficulties   Cognition Arousal/Alertness: Awake/alert Behavior During Therapy: WFL for tasks assessed/performed Overall Cognitive Status: Within Functional Limits for tasks assessed                                 General Comments: Increased processing time with documented history of memory difficulty, WFL and very pleasant during session      General Comments General comments (skin integrity, edema, etc.): VSS, 1LO2 via  with SPO2 immediately post-ambulation 99%    Exercises     Assessment/Plan    PT Assessment Patient needs continued PT services  PT Problem List Decreased strength;Decreased mobility;Decreased balance;Decreased knowledge of use of DME;Decreased activity tolerance;Decreased safety awareness;Cardiopulmonary status limiting activity       PT Treatment Interventions DME instruction;Therapeutic activities;Gait training;Therapeutic exercise;Patient/family education;Balance training;Stair training;Functional mobility training;Neuromuscular re-education    PT Goals (Current goals can be found in the Care Plan section)  Acute Rehab PT Goals Patient Stated Goal: get stronger PT Goal Formulation: With patient/family Time For Goal Achievement: 08/26/20 Potential to Achieve Goals: Good    Frequency Min 3X/week   Barriers to discharge        Co-evaluation               AM-PAC PT "6 Clicks" Mobility  Outcome Measure Help needed turning from your back to your side while in a flat bed without using bedrails?: A Little Help needed moving from lying on your back to sitting on the side of a flat bed without using bedrails?: A Little Help needed moving to and from a bed to a chair (including a wheelchair)?: A Little Help needed standing up from a chair using your arms (e.g., wheelchair or bedside chair)?: A Little Help needed to walk in hospital room?: A Little Help needed climbing 3-5 steps with a railing? : A Lot 6 Click Score: 17    End of Session Equipment  Utilized During Treatment: Oxygen Activity Tolerance: Patient tolerated treatment well Patient left: in chair;with chair alarm set;with call bell/phone within reach;with family/visitor present Nurse Communication: Mobility status PT Visit Diagnosis: Other abnormalities of gait and mobility (R26.89);Difficulty in walking, not elsewhere classified (R26.2)    Time: 1330-1402 PT Time Calculation (min) (ACUTE ONLY): 32 min   Charges:   PT Evaluation $PT Eval Low Complexity: 1 Low PT Treatments $Gait Training: 8-22 mins       Orville Widmann E, PT Acute Rehabilitation Services Pager [phone]  Office [phone]    Roxine Caddy D Elonda Husky 08/12/2020, 2:34 PM

## 2020-08-12 NOTE — Progress Notes (Signed)
  Echocardiogram 2D Echocardiogram has been performed.  [name] 08/12/2020, 11:51 AM

## 2020-08-12 NOTE — Progress Notes (Signed)
Patient refused her sliding scale coverage this morning. States she is diet controlled and does not take insulin at home.

## 2020-08-13 ENCOUNTER — Inpatient Hospital Stay (HOSPITAL_COMMUNITY): Payer: Medicare Other

## 2020-08-13 DIAGNOSIS — I509 Heart failure, unspecified: Secondary | ICD-10-CM | POA: Diagnosis not present

## 2020-08-13 HISTORY — PX: IR THORACENTESIS ASP PLEURAL SPACE W/IMG GUIDE: IMG5380

## 2020-08-13 LAB — URINALYSIS, ROUTINE W REFLEX MICROSCOPIC
Bilirubin Urine: NEGATIVE
Glucose, UA: 50 mg/dL — AB
Ketones, ur: NEGATIVE mg/dL
Leukocytes,Ua: NEGATIVE
Nitrite: NEGATIVE
Protein, ur: 100 mg/dL — AB
Specific Gravity, Urine: 1.02 (ref 1.005–1.030)
pH: 5 (ref 5.0–8.0)

## 2020-08-13 LAB — BODY FLUID CELL COUNT WITH DIFFERENTIAL
Eos, Fluid: 0 %
Lymphs, Fluid: 34 %
Monocyte-Macrophage-Serous Fluid: 46 % — ABNORMAL LOW (ref 50–90)
Neutrophil Count, Fluid: 20 % (ref 0–25)
Total Nucleated Cell Count, Fluid: 443 cu mm (ref 0–1000)

## 2020-08-13 LAB — CBC
HCT: 30.3 % — ABNORMAL LOW (ref 36.0–46.0)
Hemoglobin: 9.7 g/dL — ABNORMAL LOW (ref 12.0–15.0)
MCH: 32.3 pg (ref 26.0–34.0)
MCHC: 32 g/dL (ref 30.0–36.0)
MCV: 101 fL — ABNORMAL HIGH (ref 80.0–100.0)
Platelets: 334 10*3/uL (ref 150–400)
RBC: 3 MIL/uL — ABNORMAL LOW (ref 3.87–5.11)
RDW: 14.9 % (ref 11.5–15.5)
WBC: 7.1 10*3/uL (ref 4.0–10.5)
nRBC: 0 % (ref 0.0–0.2)

## 2020-08-13 LAB — BASIC METABOLIC PANEL
Anion gap: 13 (ref 5–15)
BUN: 26 mg/dL — ABNORMAL HIGH (ref 8–23)
CO2: 27 mmol/L (ref 22–32)
Calcium: 9.2 mg/dL (ref 8.9–10.3)
Chloride: 98 mmol/L (ref 98–111)
Creatinine, Ser: 1.76 mg/dL — ABNORMAL HIGH (ref 0.44–1.00)
GFR, Estimated: 28 mL/min — ABNORMAL LOW (ref >60)
Glucose, Bld: 138 mg/dL — ABNORMAL HIGH (ref 70–99)
Potassium: 3.5 mmol/L (ref 3.5–5.1)
Sodium: 138 mmol/L (ref 135–145)

## 2020-08-13 LAB — GLUCOSE, CAPILLARY
Glucose-Capillary: 128 mg/dL — ABNORMAL HIGH (ref 70–99)
Glucose-Capillary: 151 mg/dL — ABNORMAL HIGH (ref 70–99)
Glucose-Capillary: 81 mg/dL (ref 70–99)
Glucose-Capillary: 200 mg/dL — ABNORMAL HIGH (ref 70–99)

## 2020-08-13 LAB — PROTEIN, PLEURAL OR PERITONEAL FLUID: Total protein, fluid: <3 g/dL

## 2020-08-13 LAB — GLUCOSE, PLEURAL OR PERITONEAL FLUID: Glucose, Fluid: 160 mg/dL

## 2020-08-13 LAB — GRAM STAIN

## 2020-08-13 LAB — URINE CULTURE: Culture: <10000 — AB

## 2020-08-13 LAB — TSH: TSH: 1.183 u[IU]/mL (ref 0.350–4.500)

## 2020-08-13 IMAGING — US IR THORACENTESIS ASP PLEURAL SPACE W/IMG GUIDE
1 series · 4 of 4 positions shown · non-contrast
Comparison: none

INDICATION: Patient with history of congestive heart failure, recurrent pleural
effusions. Imaging shows bilateral pleural effusions right greater
than left-request to IR for diagnostic and therapeutic
thoracentesis.

[Series 1: ir (id) (id)/(id)/(id) ir · 4 of 4 slices shown]
[im 1/4]
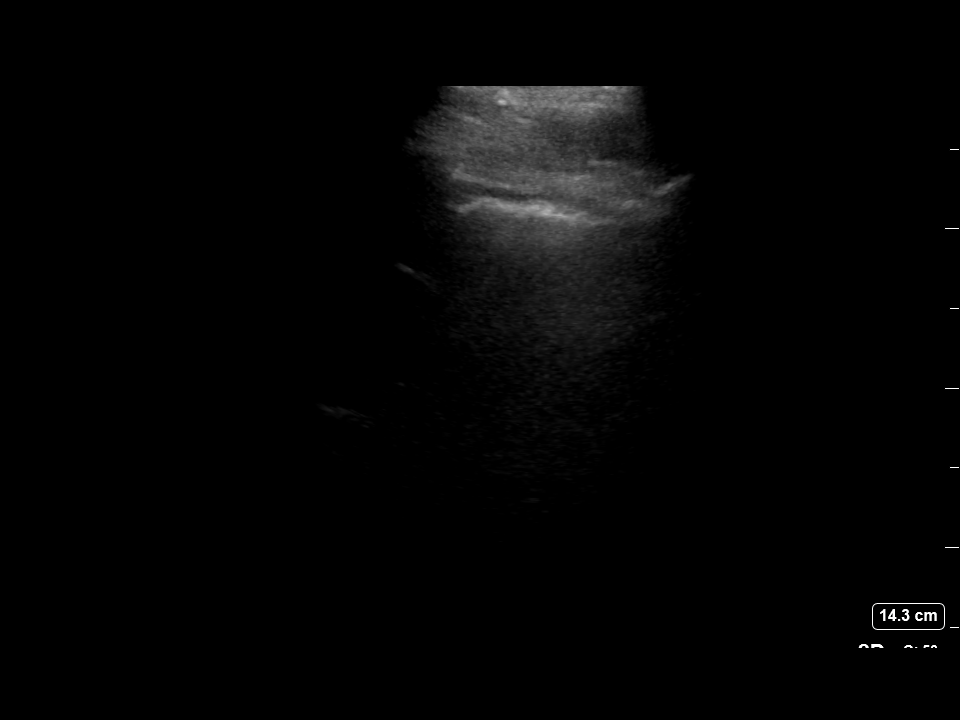
[im 2/4]
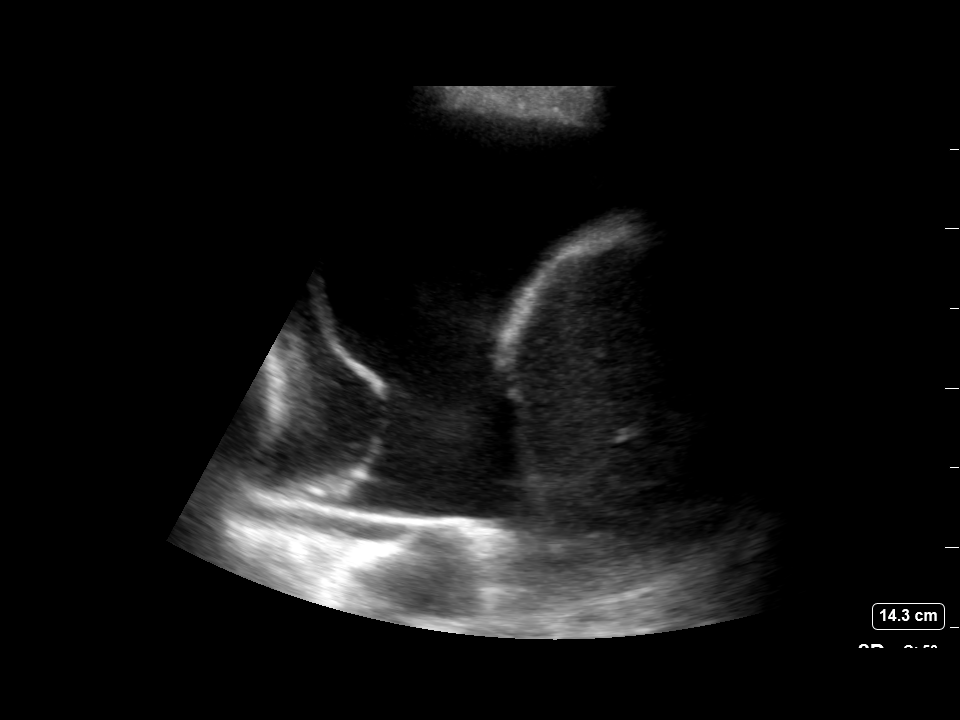
[im 3/4]
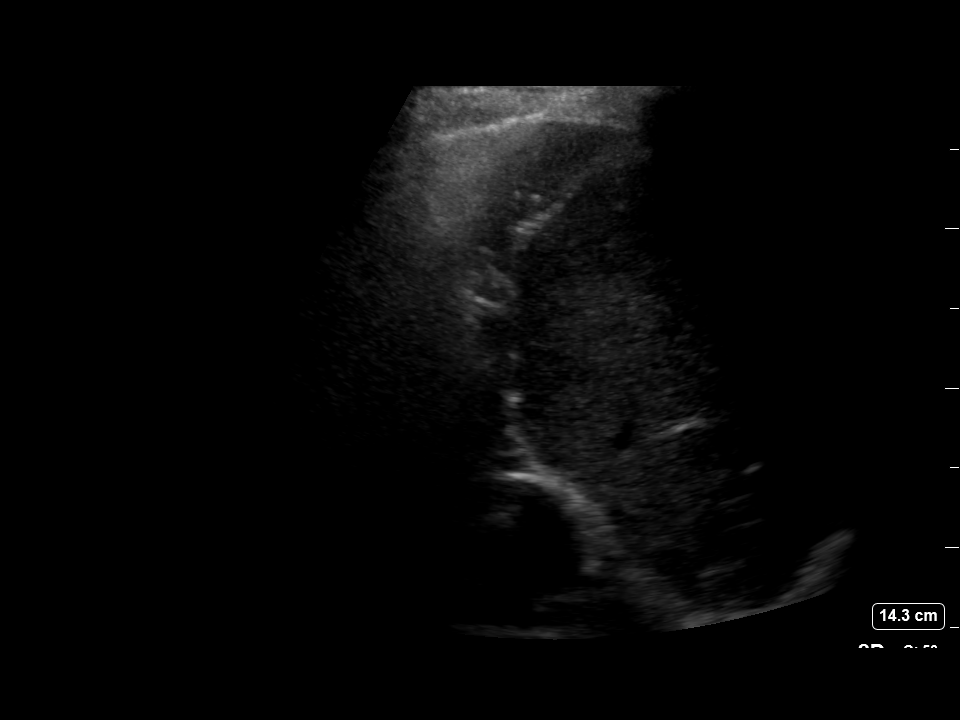
[im 4/4]
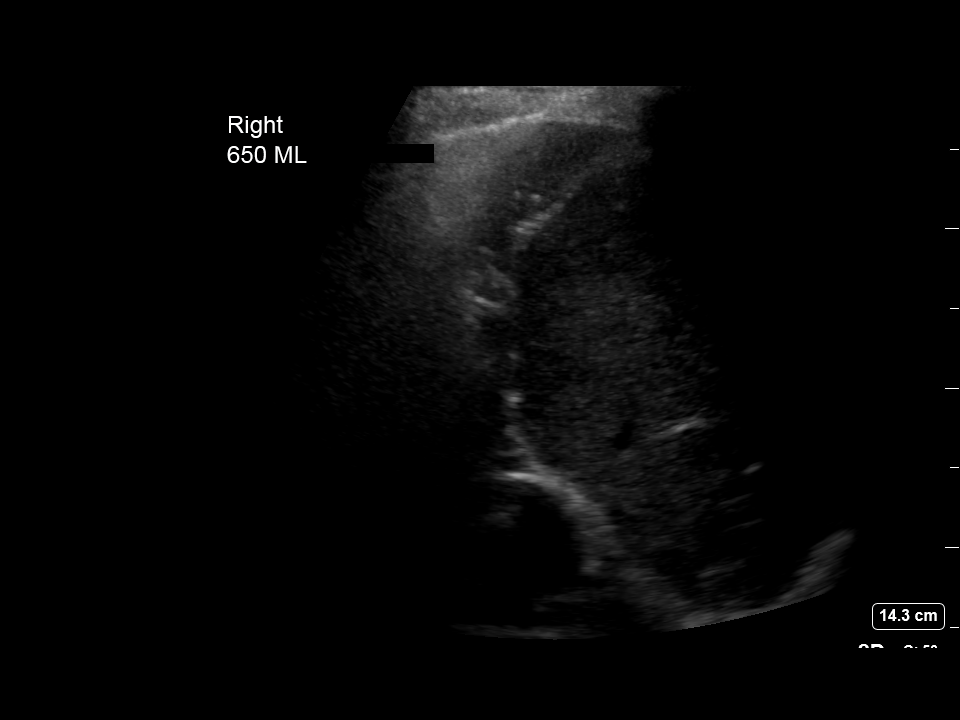

[4 of 4 positions shown; findings below may reference images not displayed]

EXAM:
ULTRASOUND GUIDED RIGHT THORACENTESIS

MEDICATIONS:
7 mL 1% lidocaine

COMPLICATIONS:
None immediate.

PROCEDURE:
An ultrasound guided thoracentesis was thoroughly discussed with the
patient and questions answered. The benefits, risks, alternatives
and complications were also discussed. The patient understands and
wishes to proceed with the procedure. Written consent was obtained.

Ultrasound was performed to localize and mark an adequate pocket of
fluid in the right chest. The area was then prepped and draped in
the normal sterile fashion. 1% Lidocaine was used for local
anesthesia. Under ultrasound guidance a 6 Fr Safe-T-Centesis
catheter was introduced. Thoracentesis was performed. The catheter
was removed and a dressing applied.
FINDINGS: A total of approximately 650 mL of clear yellow fluid was removed.
Samples were sent to the laboratory as requested by the clinical
team.
IMPRESSION: Successful ultrasound guided right thoracentesis yielding 650 mL of
pleural fluid.

Read by MAGLAFERIDZE

## 2020-08-13 IMAGING — CR DG CHEST 2V
2 series · 2 of 2 positions shown · non-contrast
Comparison: [DATE]

CLINICAL DATA: Shortness of breath, follow-up pleural effusion

EXAM:
CHEST - 2 VIEW

[chest lat]
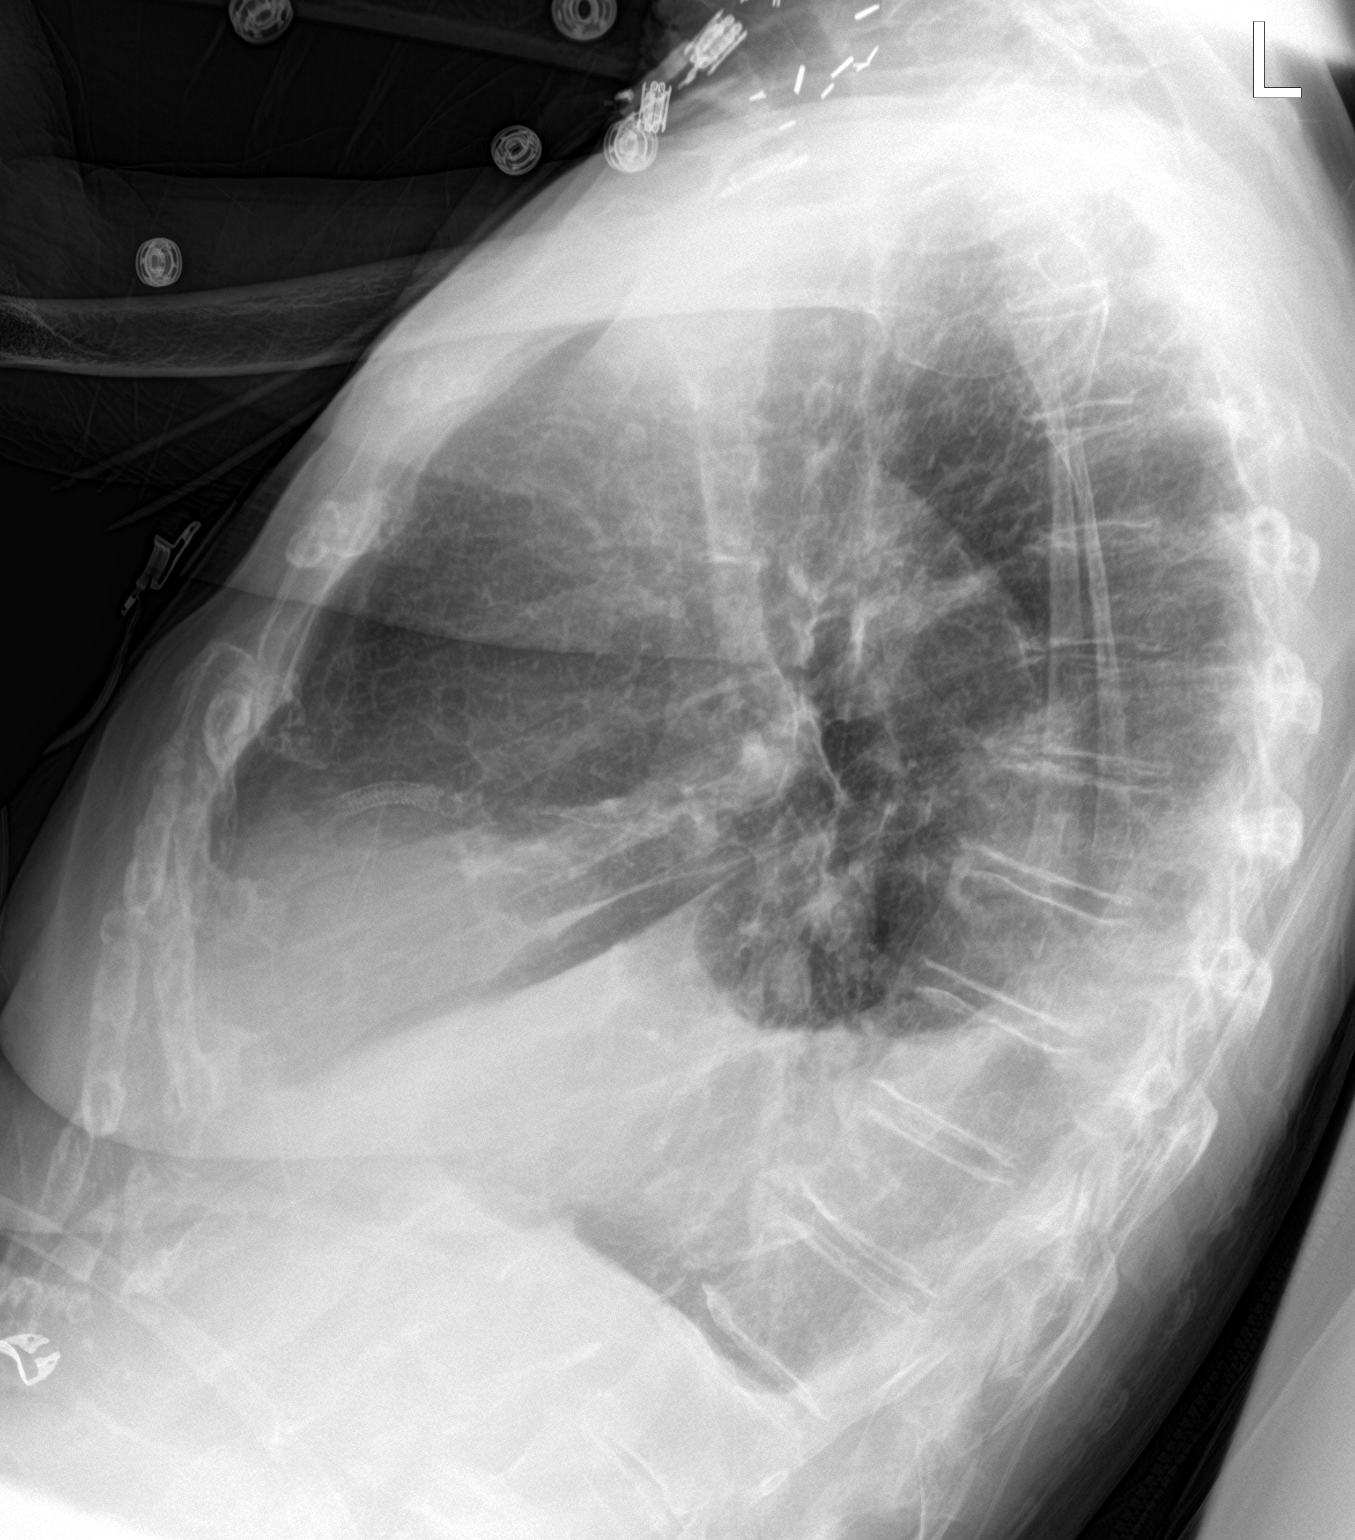

[chest ap]
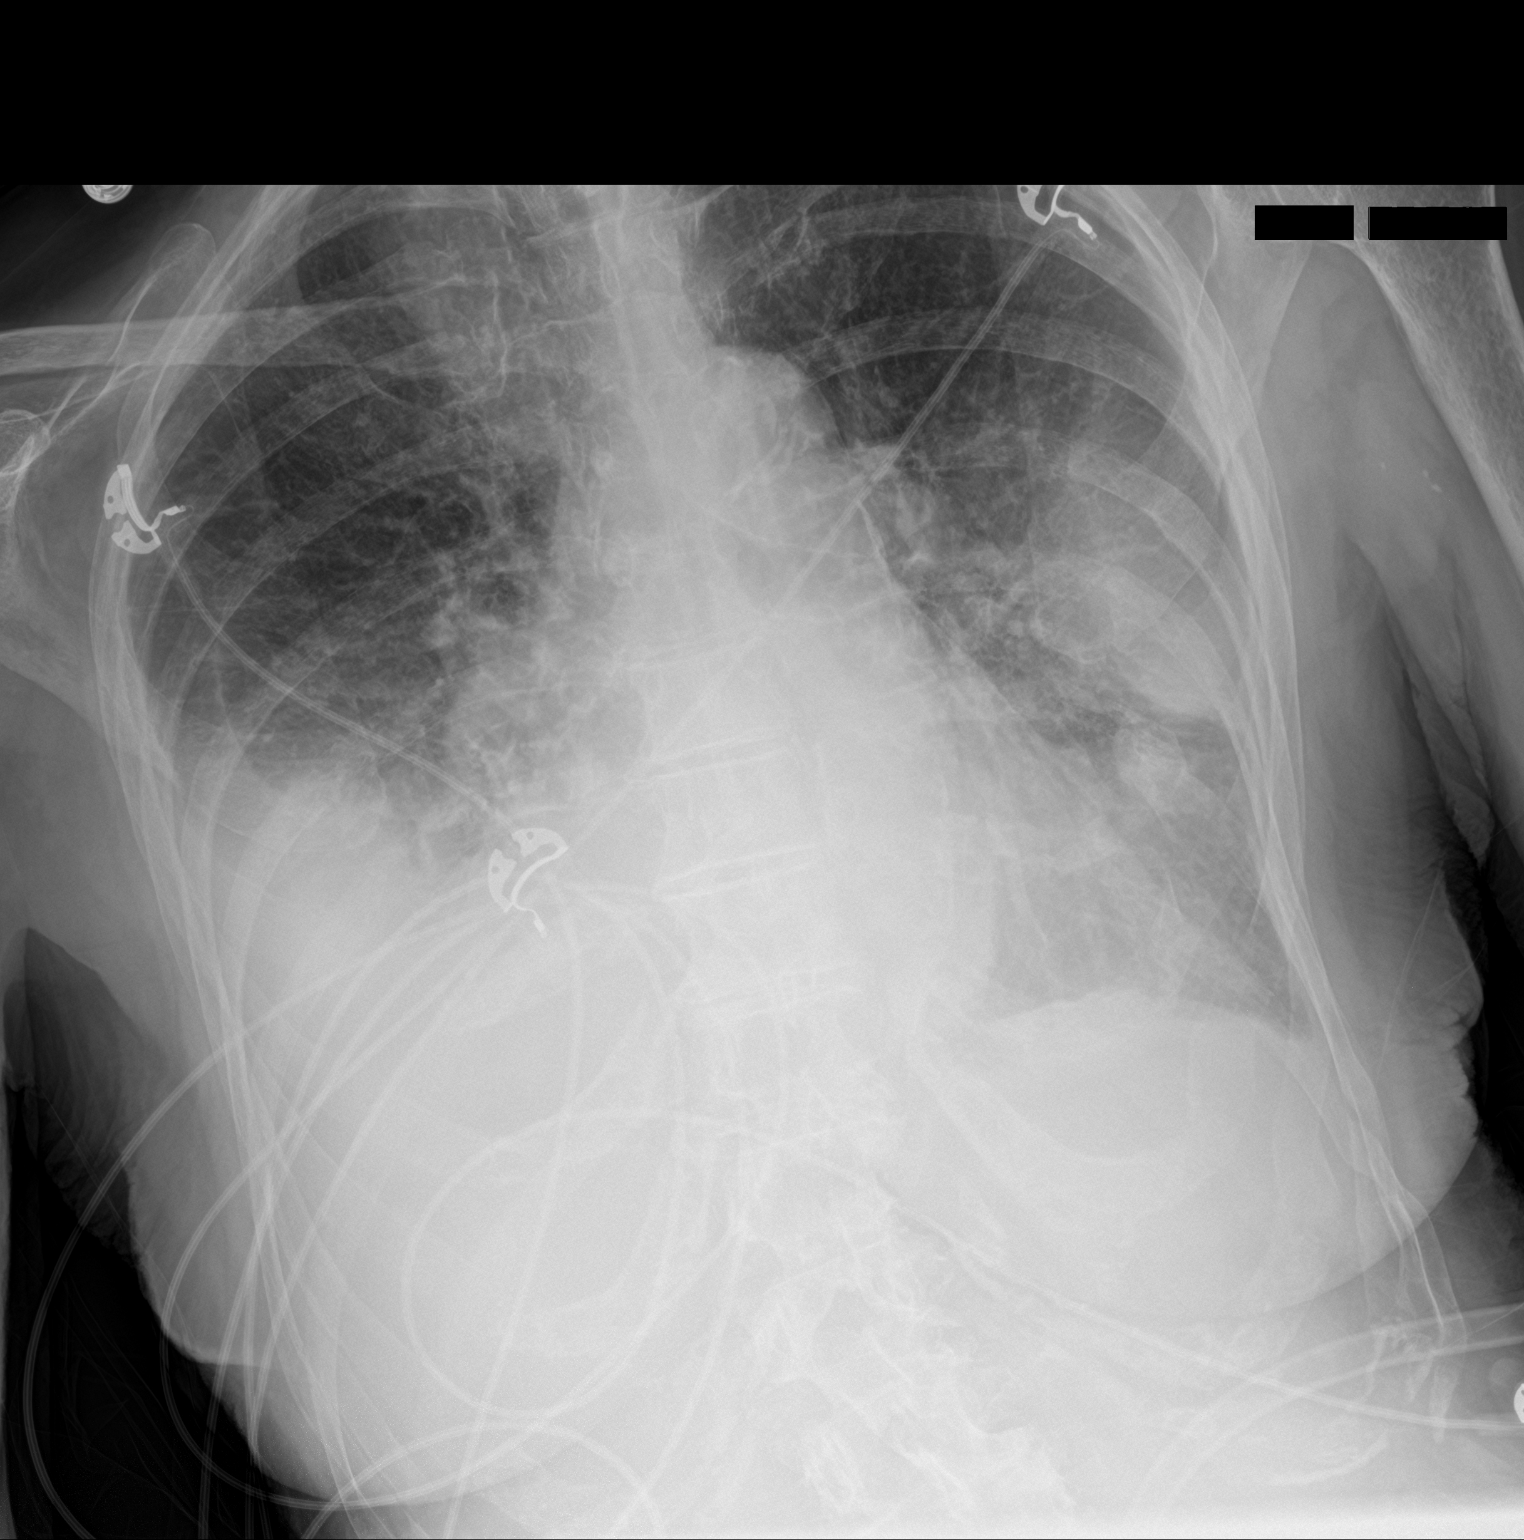

[2 of 2 positions shown; findings below may reference images not displayed]

FINDINGS: Cardiac shadow is mildly enlarged but stable. Bilateral pleural
effusions are noted right greater than left with some loculated
components on the left similar to that seen on recent CT
examination. Right-sided effusion is increased in the interval from
the prior study. Aortic calcifications are noted. No new focal
infiltrate is seen. No acute bony abnormality is noted.
IMPRESSION: Bilateral pleural effusions right greater than left. The right-sided
effusion has increased in the interval from the prior exam.

## 2020-08-13 IMAGING — CR DG CHEST 1V
1 series · 1 of 1 positions shown · non-contrast
Comparison: Chest radiograph dated [DATE].

CLINICAL DATA: 88-year-old female status post thoracentesis.

EXAM:
CHEST  1 VIEW

[chest ap]
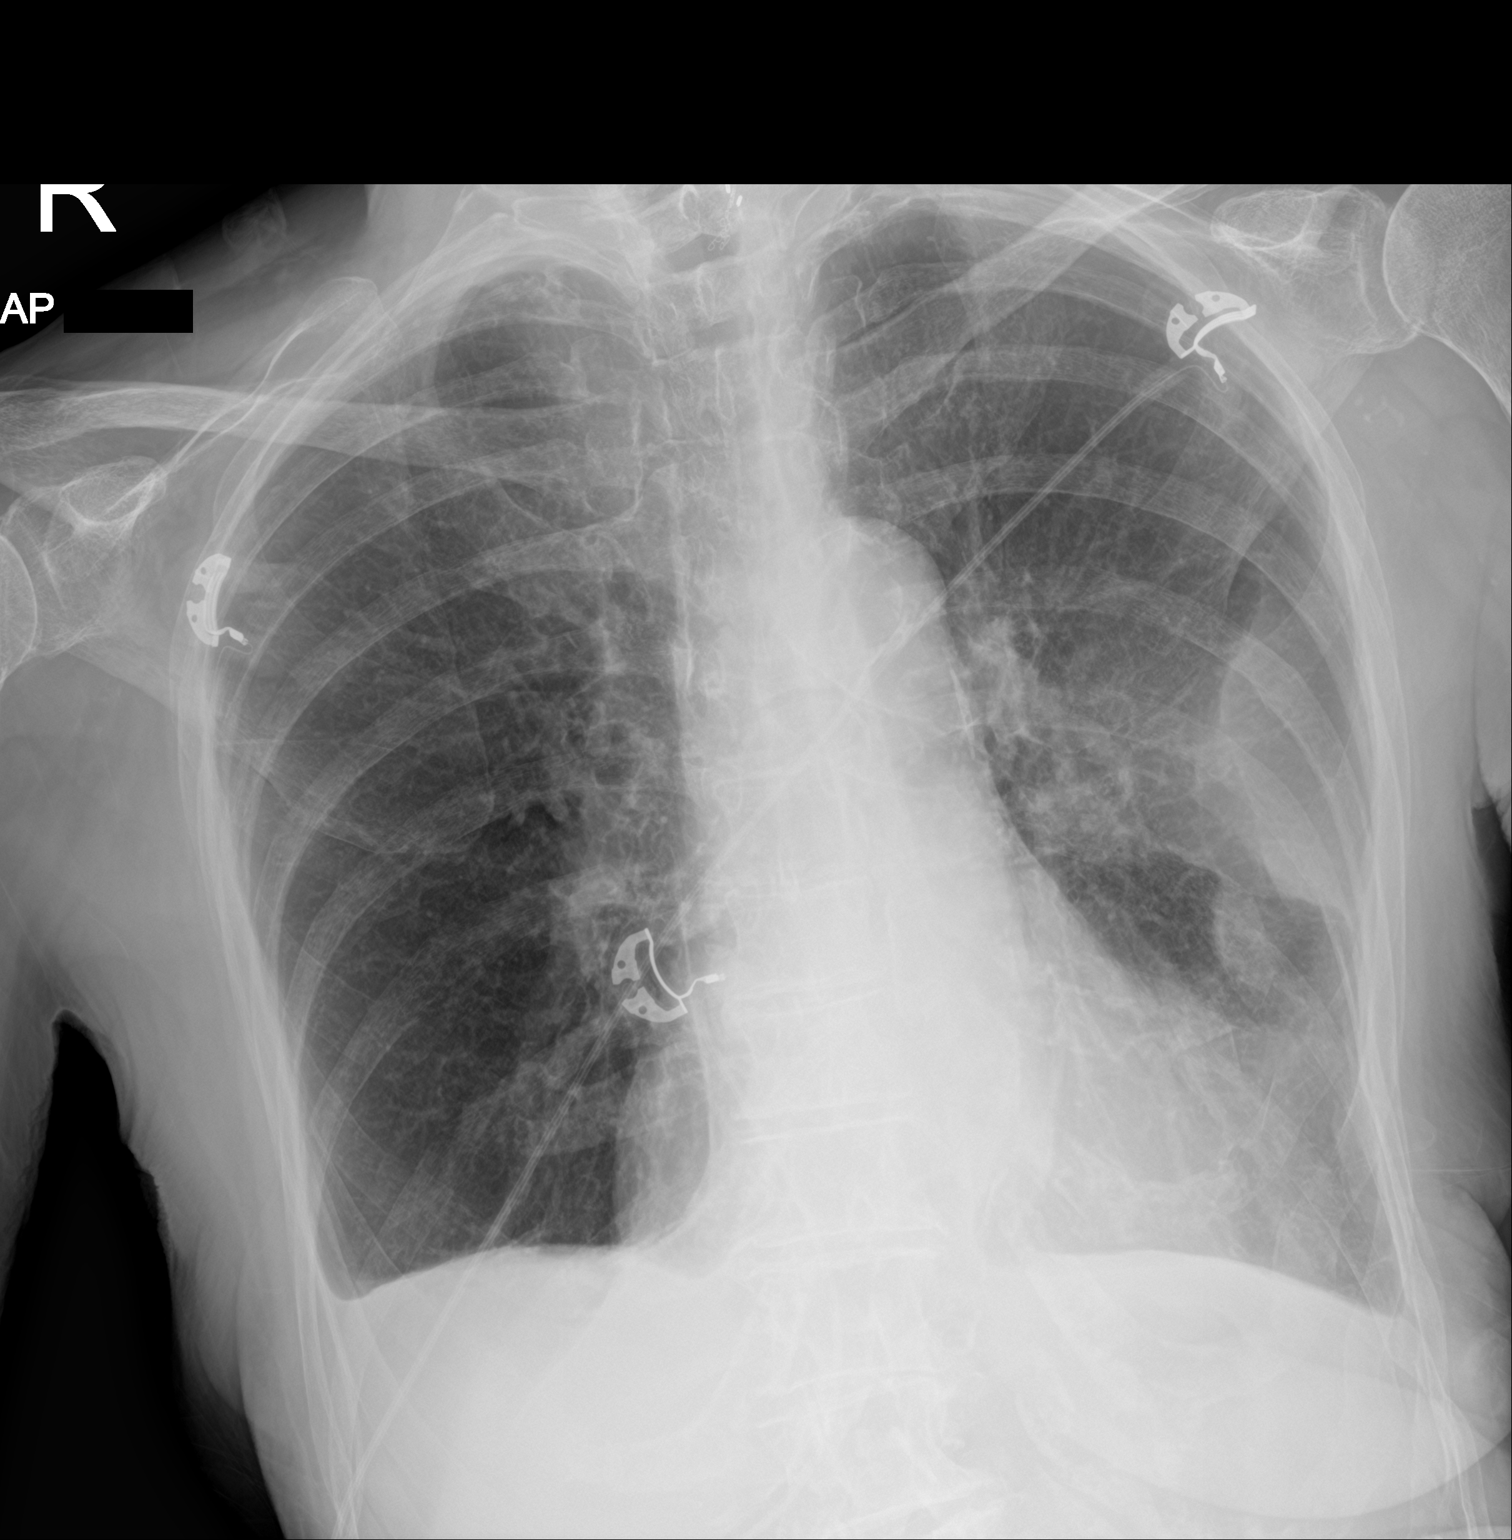

[1 of 1 positions shown; findings below may reference images not displayed]

FINDINGS: Significant interval decrease in the size of the right pleural
effusion with improved aeration of the right lung.

Loculated pleural effusion along the left lateral lung noted. There
is improved aeration of the left lung base. No pneumothorax. Stable
cardiac silhouette. Atherosclerotic calcification of the aorta. No
acute osseous pathology.
IMPRESSION: Significant interval decrease in the size of the right pleural
effusion with improved aeration of the right lung. No pneumothorax.

## 2020-08-13 MED ORDER — FUROSEMIDE 20 MG PO TABS
20.0000 mg | ORAL_TABLET | Freq: Every day | ORAL | Status: DC
  Administered 2020-08-13 – 2020-08-14 (×2): 20 mg via ORAL
  Filled 2020-08-13 (×2): qty 1

## 2020-08-13 MED ORDER — LIDOCAINE HCL 1 % IJ SOLN
INTRAMUSCULAR | Status: AC
  Filled 2020-08-13: qty 20

## 2020-08-13 MED ORDER — LIDOCAINE HCL 1 % IJ SOLN
INTRAMUSCULAR | Status: DC | PRN
  Administered 2020-08-13: 10 mL

## 2020-08-13 MED ORDER — FUROSEMIDE 20 MG PO TABS: 20.0000 mg | ORAL_TABLET | Freq: Every day | ORAL | 0 refills | Status: DC

## 2020-08-13 NOTE — Telephone Encounter (Signed)
Dr. Valeta Harms just FYI  Hi Dr. Valeta Harms. My mother, [name], is in the hospital, just diagnosed with congestive heart failure. After 2 days of IV lasix, there is still a fair amount of fluid in her lungs. The hospital surgeon is removing fluid right now. We will, more than likely need to follow up with you.  I just wanted to make you aware and know that test results are constantly being added to her chart.  We will be in touch.   Sherrie Mustache Reiling

## 2020-08-13 NOTE — Discharge Summary (Signed)
Physician Discharge Summary  Jasilyn Holderman ZOX:[id number] DOB: [birth year] DOA: 08/11/2020  PCP: Michael Boston, MD  Admit date: 08/11/2020 Discharge date: 08/13/2020  Admitted From:Home Disposition:Home  Recommendations for Outpatient Follow-up:  1. Follow up with PCP in 1-2 weeks 2. Please obtain BMP/CBC in one week 3. Please follow up on the following pending results:  Home Health:No.  Equipment/Devices:None.  Discharge Condition:Stable Code Status:   Code Status: Full Code Diet recommendation:  Diet Order            Diet - low sodium heart healthy           Diet heart healthy/carb modified Room service appropriate? Yes; Fluid consistency: Thin; Fluid restriction: 1500 mL Fluid  Diet effective now                 Brief/Interim Summary: 86 year old female with history of CAD history of stent x2, diabetes, hypertension, hyperlipidemia, kidney disease, memory impairment presented with worsening shortness of breath and edema, fatigue, not feeling herself, hypoxia with pulse ox 70 to 80s on room air. Patient is a poor historian and daughter was helping with the history,as per report: Patient has had several days of increasing edema. She had has noted some mild wooziness and just not feeling herself 08/10/20 along with fatigue when doing her usual activities.She noted that she was short of breath 07/2120,her daughter checked her pulse ox and her saturations were in the 70s to 80s on room air.This prompted the daughter to call EMS to bring patient to [place] for further evaluation.They deny any changes in her diet or intake. Patient denies orthopnea but states that she has always slept on 2 pillows.Patient does not use tobacco or alcohol. She denies chest pain, abdominal pain, constipation, diarrhea, fever.  ED Course:Vital signs notable for heart rate in the 40s to 50s,blood pressure in the 811B to 147W systolic.CMP showed creatinine of 1.6 which is stable, glucose of  180. CBC showed hemoglobin of 10.8 which is stable per family report.Normal PT and INR. Lipase normal, lactic acid normal. BNP elevated at 1000, troponin trend flat at 18 and 19. VBG within normal limits. Respiratory panel for flu and Covid negative.Chest x-ray showed bilateral pleural effusions and chest CT showed moderate right effusion and small to moderate left effusion with pulmonary congestion and cardiomegaly. Patient received a dose of 40 mg IV Lasix in ED. Patient is admitted and IV Lasix, supplemental nasal cannula. She had good urine output and improvement in her respiratory status and edema with Lasix, weight down from 120-110 pounds.Echocardiogram normal EF but indeterminate diastolic function. Suspecting CHF with preserved EF. Chest x-ray-showed pleural effusion worse on the right side and sent for ultrasound-guided thoracentesis with lab work-up.  She had previously left-sided thoracentesis and Pleurx placement due to contusion from rib fracture/fall, she has follow-up with Dr. Valeta Harms.Patient/daughter requesting to be discharged later today after thoracentesis, and they will follow-up with pulmonary for further pleural fluid analysis lab,and will need PCP/Nephrology to monitor renal function while on diuresis.  Discharge Diagnoses:  Assessment & Plan:  Dyspnea w/ lung imaging showing b/l pleural effusion, pulm congestino and BNP in 1055,  suspecting  CHF w/ preserved LVEF:  She is clinically improved w/ diuresus, weight down by 10 pounds no longer needing oxygen.  We will keep on low-dose Lasix advised follow-up with pcp/nephro to monitor renal function, provided with CHF discharge instruction to monitor weight, watch for fluid overload, 2 gm salt restriction and 1500 ml fluid restriction and  monitor symptoms.  Bilateral pleural effusion with recent left-sided Pleurx status secondary to chest wall contusion/rib fractures,x-ray in June showed only trace bilateral effusion.Diuresed and  repeat x-ray shows right-sided pleural effusion worse than left, thoracentesis ordered. IR procedure"650 milliliters of clear yellow fluid. Patient tolerated procedure well.EBL: <1 mL.No immediate complications,Specimen was sent for labs.Post procedure CXR shows no pneumothorax".   Acute hypoxic respiratory failure due to #1/#2:  It has now resolved.  Doing well on room air.    CKD stage IIIb: Creatinine baseline around 1.6 in oct 2021,close to baseline 1.7 today.  Monitor closely while on Lasix, discussed with patient's daughter.  She generally follows up with [facility]. Bmp in 1 wk. Recent Labs  Lab 08/11/20 1359 08/12/20 0419 08/13/20 0254  BUN 23 22 26*  CREATININE 1.60* 1.88* 1.76*   Hypokalemia-repleted  CAD with history of stent/essential hypertension/HLD: No chest pain troponin flat 18, 19, continue home losartan, Plavix, rosuvastatin.  Holding metoprolol due to bradycardia.  Having sinus bradycardia and asymptomatic.  Type 2 diabetes mellitus without complication hemoglobin O9B 7.4 continue home meds.    Memory impairment home Aricept:continue delirium precaution supportive care, fall precaution Worked w/ PT and did well, setting up home health PT  Subjective: ALERT,AWAKE, NO SHORTNESS OF BREATH, OFF OXYGEN.  Discharge Exam: Vitals:   08/13/20 1045 08/13/20 1500  BP: (!) 132/52 (!) 140/45  Pulse: (!) 59   Resp:    Temp:    SpO2: 96%    General: Pt is alert, awake, not in acute distress Cardiovascular: RRR, S1/S2 +, no rubs, no gallops Respiratory: CTA bilaterally, no wheezing, no rhonchi Abdominal: Soft, NT, ND, bowel sounds + Extremities: no edema, no cyanosis  Discharge Instructions  Discharge Instructions    (HEART FAILURE PATIENTS) Call MD:  Anytime you have any of the following symptoms: 1) 3 pound weight gain in 24 hours or 5 pounds in 1 week 2) shortness of breath, with or without a dry hacking cough 3) swelling in the hands, feet or stomach 4) if  you have to sleep on extra pillows at night in order to breathe.   Complete by: As directed    Diet - low sodium heart healthy   Complete by: As directed    1500 ml fluid restriction.\ 2 gm salt restriciton   Discharge instructions   Complete by: As directed    Your Actos has been held due to CHF/fluid retention, please discuss with your primary care doctor for alternative medication for diabetes.  You have been prescribed Lasix for 3 weeks and please follow-up with PCP/cardiology to resume further, and will need follow-up BMP next week  Please call call MD or return to ER for similar or worsening recurring problem that brought you to hospital or if any fever,nausea/vomiting,abdominal pain, uncontrolled pain, chest pain,  shortness of breath or any other alarming symptoms.  Please follow-up your doctor as instructed in a week time and call the office for appointment.  Please avoid alcohol, smoking, or any other illicit substance and maintain healthy habits including taking your regular medications as prescribed.  You were cared for by a hospitalist during your hospital stay. If you have any questions about your discharge medications or the care you received while you were in the hospital after you are discharged, you can call the unit and ask to speak with the hospitalist on call if the hospitalist that took care of you is not available.  Once you are discharged, your primary care physician will  handle any further medical issues. Please note that NO REFILLS for any discharge medications will be authorized once you are discharged, as it is imperative that you return to your primary care physician (or establish a relationship with a primary care physician if you do not have one) for your aftercare needs so that they can reassess your need for medications and monitor your lab values   Increase activity slowly   Complete by: As directed      Allergies as of 08/13/2020      Reactions   Tape  Other (See Comments)   SKIN IS VERY THIN AND TEARS EXTREMELY EASILY!!   Lisinopril Cough   Nitrofuran Derivatives Nausea And Vomiting, Other (See Comments)   Dizziness, also      Medication List    STOP taking these medications   Metoprolol Tartrate 75 MG Tabs   pioglitazone 15 MG tablet Commonly known as: ACTOS     TAKE these medications   Biotin 5000 MCG Tabs Take 5,000 mcg by mouth daily.   clopidogrel 75 MG tablet Commonly known as: PLAVIX Take 75 mg by mouth every Monday, Wednesday, and Friday.   donepezil 10 MG tablet Commonly known as: ARICEPT Take 1 tablet (10 mg total) by mouth at bedtime.   furosemide 20 MG tablet Commonly known as: LASIX Take 1 tablet (20 mg total) by mouth daily for 21 days. Discuss with the cardiology for further resumption of diuretics Start taking on: August 14, 2020   levothyroxine 25 MCG tablet Commonly known as: SYNTHROID Take 25 mcg by mouth daily before breakfast.   losartan 100 MG tablet Commonly known as: COZAAR Take 50 mg by mouth at bedtime.   rosuvastatin 40 MG tablet Commonly known as: CRESTOR Take 40 mg by mouth at bedtime.   sertraline 100 MG tablet Commonly known as: ZOLOFT Take 100 mg by mouth at bedtime.   Vitamin D3 Super Strength 50 MCG (2000 UT) Tabs Generic drug: Cholecalciferol Take 2,000 Units by mouth daily.       Follow-up Information    Jacalyn Lefevre Jesse Sans, MD Follow up.   Specialty: Internal Medicine Why: bmp check in 5 days Contact information: [place] [address] [phone]        O'Neal, Cassie Freer, MD .   Specialties: Internal Medicine, Cardiology, Radiology Contact information: [place] [address] [phone]              Allergies  Allergen Reactions  . Tape Other (See Comments)    SKIN IS VERY THIN AND TEARS EXTREMELY EASILY!!  . Lisinopril Cough  . Nitrofuran Derivatives Nausea And Vomiting and Other (See Comments)    Dizziness,  also    The results of significant diagnostics from this hospitalization (including imaging, microbiology, ancillary and laboratory) are listed below for reference.    Microbiology: Recent Results (from the past 240 hour(s))  Resp Panel by RT-PCR (Flu A&B, Covid) Nasopharyngeal Swab     Status: None   Collection Time: 08/11/20  2:16 PM   Specimen: Nasopharyngeal Swab; Nasopharyngeal(NP) swabs in vial transport medium  Result Value Ref Range Status   SARS Coronavirus 2 by RT PCR NEGATIVE NEGATIVE Final    Comment: (NOTE) SARS-CoV-2 target nucleic acids are NOT DETECTED.  The SARS-CoV-2 RNA is generally detectable in upper respiratory specimens during the acute phase of infection. The lowest concentration of SARS-CoV-2 viral copies this assay can detect is 138 copies/mL. A negative result does not preclude SARS-Cov-2 infection and  should not be used as the sole basis for treatment or other patient management decisions. A negative result may occur with  improper specimen collection/handling, submission of specimen other than nasopharyngeal swab, presence of viral mutation(s) within the areas targeted by this assay, and inadequate number of viral copies(<138 copies/mL). A negative result must be combined with clinical observations, patient history, and epidemiological information. The expected result is Negative.  Fact Sheet for Patients:  EntrepreneurPulse.com.au  Fact Sheet for Healthcare Providers:  IncredibleEmployment.be  This test is no t yet approved or cleared by the [place] FDA and  has been authorized for detection and/or diagnosis of SARS-CoV-2 by FDA under an Emergency Use Authorization (EUA). This EUA will remain  in effect (meaning this test can be used) for the duration of the COVID-19 declaration under Section 564(b)(1) of the Act, 21 U.S.C.section 360bbb-3(b)(1), unless the authorization is terminated  or revoked sooner.        Influenza A by PCR NEGATIVE NEGATIVE Final   Influenza B by PCR NEGATIVE NEGATIVE Final    Comment: (NOTE) The Xpert Xpress SARS-CoV-2/FLU/RSV plus assay is intended as an aid in the diagnosis of influenza from Nasopharyngeal swab specimens and should not be used as a sole basis for treatment. Nasal washings and aspirates are unacceptable for Xpert Xpress SARS-CoV-2/FLU/RSV testing.  Fact Sheet for Patients: EntrepreneurPulse.com.au  Fact Sheet for Healthcare Providers: IncredibleEmployment.be  This test is not yet approved or cleared by the [place] FDA and has been authorized for detection and/or diagnosis of SARS-CoV-2 by FDA under an Emergency Use Authorization (EUA). This EUA will remain in effect (meaning this test can be used) for the duration of the COVID-19 declaration under Section 564(b)(1) of the Act, 21 U.S.C. section 360bbb-3(b)(1), unless the authorization is terminated or revoked.  Performed at [facility], [address] [place], [place] [address]     Procedures/Studies: DG Chest 2 View  Result Date: 08/13/2020 CLINICAL DATA:  Shortness of breath, follow-up pleural effusion EXAM: CHEST - 2 VIEW COMPARISON:  08/11/2020 FINDINGS: Cardiac shadow is mildly enlarged but stable. Bilateral pleural effusions are noted right greater than left with some loculated components on the left similar to that seen on recent CT examination. Right-sided effusion is increased in the interval from the prior study. Aortic calcifications are noted. No new focal infiltrate is seen. No acute bony abnormality is noted. IMPRESSION: Bilateral pleural effusions right greater than left. The right-sided effusion has increased in the interval from the prior exam. Electronically Signed   By: Inez Catalina M.D.   On: 08/13/2020 08:09   CT CHEST WO CONTRAST  Result Date: 08/11/2020 CLINICAL DATA:  Cardiomegaly. Follow-up. Patient felt  tired today. Wheezing and cough. EXAM: CT CHEST WITHOUT CONTRAST TECHNIQUE: Multidetector CT imaging of the chest was performed following the standard protocol without IV contrast. COMPARISON:  Chest x-ray August 11, 2020.  Chest CT March 16, 2020. FINDINGS: Cardiovascular: Cardiomegaly. Three-vessel coronary artery disease. The thoracic aorta demonstrates moderate atherosclerotic change without aneurysm. Central pulmonary arteries are unremarkable. Mediastinum/Nodes: There is a moderate right-sided pleural effusion which appears to be free-flowing. There is a small to moderate left-sided pleural effusion with loculated components. There is a tiny pericardial effusion. The patient appears to be status post resection of the left thyroid lobe. The remaining right thyroid lobe is grossly unremarkable. The esophagus is normal. No adenopathy. The chest wall is normal in appearance. Lungs/Pleura: There is atelectasis associated with the bilateral effusions. Scarring in the  apices is stable. Minimal interlobular septal thickening suggests pulmonary venous congestion. No evidence of pneumonia. No nodule or mass. Upper Abdomen: No acute abnormality. Musculoskeletal: No chest wall mass or suspicious bone lesions identified. IMPRESSION: 1. There is a moderate free-flowing right-sided pleural effusion. 2. There is a small to moderate left-sided pleural effusion with loculated components. 3. Cardiomegaly and mild pulmonary venous congestion. 4. Thoracic aortic atherosclerosis.  Coronary artery disease. 5. No other acute abnormalities. Aortic Atherosclerosis (ICD10-I70.0). Electronically Signed   By: Dorise Bullion III M.D   On: 08/11/2020 17:19   DG Chest Port 1 View  Result Date: 08/11/2020 CLINICAL DATA:  Fatigue, hypoxia, wheezing, cough EXAM: PORTABLE CHEST 1 VIEW COMPARISON:  03/19/2020 FINDINGS: There are moderate bilateral layering pleural effusions and associated atelectasis or consolidation. Cardiomegaly.  IMPRESSION: 1. There are moderate bilateral layering pleural effusions and associated atelectasis or consolidation. 2. Cardiomegaly. Electronically Signed   By: Eddie Candle M.D.   On: 08/11/2020 14:56   ECHOCARDIOGRAM COMPLETE  Result Date: 08/12/2020    ECHOCARDIOGRAM REPORT   Patient Name:   BLAINE GUIFFRE Date of Exam: 08/12/2020 Medical Rec #:  [id number]        Height:       67.0 in Accession #:    [id number]       Weight:       114.3 lb Date of Birth:  04-Jan-1932         BSA:          1.594 m Patient Age:    86 years         BP:           146/60 mmHg Patient Gender: F                HR:           52 bpm. Exam Location:  Inpatient Procedure: 2D Echo, Cardiac Doppler and Color Doppler Indications:    Dyspnea  History:        Patient has no prior history of Echocardiogram examinations. CAD                 and Previous Myocardial Infarction, Signs/Symptoms:Shortness of                 Breath; Risk Factors:Hypertension, Diabetes and Dyslipidemia.                 CKD.  Sonographer:    Clayton Lefort RDCS (AE) Referring Phys: [id number] [place]  1. Left ventricular ejection fraction, by estimation, is 55 to 60%. The left ventricle has normal function. The left ventricle has no regional wall motion abnormalities. There is mild left ventricular hypertrophy. Left ventricular diastolic parameters are indeterminate.  2. Right ventricular systolic function is normal. The right ventricular size is normal. There is moderately elevated pulmonary artery systolic pressure. The estimated right ventricular systolic pressure is 33.8 mmHg.  3. Trivial to small pericardial effusion. The pericardial effusion is circumferential. There is no evidence of cardiac tamponade.  4. The mitral valve is abnormal. Mild mitral valve regurgitation.  5. The aortic valve is tricuspid. Aortic valve regurgitation is not visualized. Mild aortic valve sclerosis is present, with no evidence of aortic valve stenosis.  6. The  inferior vena cava is normal in size with greater than 50% respiratory variability, suggesting right atrial pressure of 3 mmHg. Comparison(s): No prior Echocardiogram. FINDINGS  Left Ventricle: Left ventricular ejection fraction, by estimation, is 55 to 60%. The left ventricle  has normal function. The left ventricle has no regional wall motion abnormalities. The left ventricular internal cavity size was normal in size. There is  mild left ventricular hypertrophy. Left ventricular diastolic parameters are indeterminate. Right Ventricle: The right ventricular size is normal. No increase in right ventricular wall thickness. Right ventricular systolic function is normal. There is moderately elevated pulmonary artery systolic pressure. The tricuspid regurgitant velocity is 3.64 m/s, and with an assumed right atrial pressure of 3 mmHg, the estimated right ventricular systolic pressure is 16.1 mmHg. Left Atrium: Left atrial size was normal in size. Right Atrium: Right atrial size was normal in size. Pericardium: Trivial to small pericardial effusion. The pericardial effusion is circumferential. There is no evidence of cardiac tamponade. Mitral Valve: The mitral valve is abnormal. There is moderate calcification of the posterior mitral valve leaflet(s). Mild mitral valve regurgitation. MV peak gradient, 5.1 mmHg. The mean mitral valve gradient is 1.0 mmHg. Tricuspid Valve: The tricuspid valve is grossly normal. Tricuspid valve regurgitation is mild. Aortic Valve: The aortic valve is tricuspid. Aortic valve regurgitation is not visualized. Mild aortic valve sclerosis is present, with no evidence of aortic valve stenosis. Aortic valve mean gradient measures 5.0 mmHg. Aortic valve peak gradient measures 10.4 mmHg. Aortic valve area, by VTI measures 2.00 cm. Pulmonic Valve: The pulmonic valve was grossly normal. Pulmonic valve regurgitation is trivial. Aorta: The aortic root and ascending aorta are structurally normal, with no  evidence of dilitation. Venous: The inferior vena cava is normal in size with greater than 50% respiratory variability, suggesting right atrial pressure of 3 mmHg. IAS/Shunts: No atrial level shunt detected by color flow Doppler. Additional Comments: There is a small pleural effusion in both left and right lateral regions.  LEFT VENTRICLE PLAX 2D LVIDd:         3.90 cm  Diastology LVIDs:         2.70 cm  LV e' medial:    4.68 cm/s LV PW:         1.10 cm  LV E/e' medial:  21.8 LV IVS:        1.10 cm  LV e' lateral:   4.06 cm/s LVOT diam:     2.00 cm  LV E/e' lateral: 25.1 LV SV:         73 LV SV Index:   46 LVOT Area:     3.14 cm  RIGHT VENTRICLE             IVC RV Basal diam:  2.50 cm     IVC diam: 1.40 cm RV S prime:     11.60 cm/s TAPSE (M-mode): 1.6 cm LEFT ATRIUM             Index       RIGHT ATRIUM           Index LA diam:        4.00 cm 2.51 cm/m  RA Area:     10.40 cm LA Vol (A2C):   50.8 ml 31.86 ml/m RA Volume:   21.50 ml  13.49 ml/m LA Vol (A4C):   48.9 ml 30.67 ml/m LA Biplane Vol: 49.8 ml 31.24 ml/m  AORTIC VALVE AV Area (Vmax):    1.93 cm AV Area (Vmean):   1.87 cm AV Area (VTI):     2.00 cm AV Vmax:           161.00 cm/s AV Vmean:          102.000 cm/s AV VTI:  0.363 m AV Peak Grad:      10.4 mmHg AV Mean Grad:      5.0 mmHg LVOT Vmax:         99.00 cm/s LVOT Vmean:        60.600 cm/s LVOT VTI:          0.231 m LVOT/AV VTI ratio: 0.64  AORTA Ao Root diam: 2.90 cm Ao Asc diam:  3.20 cm MITRAL VALVE                TRICUSPID VALVE MV Area (PHT): 3.21 cm     TR Peak grad:   53.0 mmHg MV Peak grad:  5.1 mmHg     TR Vmax:        364.00 cm/s MV Mean grad:  1.0 mmHg MV Vmax:       1.13 m/s     SHUNTS MV Vmean:      52.5 cm/s    Systemic VTI:  0.23 m MV Decel Time: 236 msec     Systemic Diam: 2.00 cm MR Peak grad: 134.1 mmHg MR Mean grad: 90.0 mmHg MR Vmax:      579.00 cm/s MR Vmean:     450.0 cm/s MV E velocity: 102.00 cm/s MV A velocity: 52.00 cm/s MV E/A ratio:  1.96 Lyman Bishop MD  Electronically signed by Lyman Bishop MD Signature Date/Time: 08/12/2020/12:48:51 PM    Final     Labs: BNP (last 3 results) Recent Labs    08/11/20 1359  BNP 3,810.1*   Basic Metabolic Panel: Recent Labs  Lab 08/11/20 1359 08/11/20 1410 08/12/20 0419 08/13/20 0254  NA 138 141 140 138  K 3.7 3.9 3.4* 3.5  CL 105  --  101 98  CO2 23  --  26 27  GLUCOSE 187*  --  125* 138*  BUN 23  --  22 26*  CREATININE 1.60*  --  1.88* 1.76*  CALCIUM 9.2  --  9.0 9.2  MG  --   --  2.2  --    Liver Function Tests: Recent Labs  Lab 08/11/20 1359  AST 22  ALT 29  ALKPHOS 70  BILITOT 0.8  PROT 7.2  ALBUMIN 3.7   Recent Labs  Lab 08/11/20 1359  LIPASE 51   No results for input(s): AMMONIA in the last 168 hours. CBC: Recent Labs  Lab 08/11/20 1359 08/11/20 1410 08/12/20 0419 08/13/20 0254  WBC 7.6  --  7.6 7.1  NEUTROABS 6.0  --   --   --   HGB 10.8* 10.5* 9.2* 9.7*  HCT 33.8* 31.0* 28.6* 30.3*  MCV 102.7*  --  102.5* 101.0*  PLT 375  --  314 334   Cardiac Enzymes: No results for input(s): CKTOTAL, CKMB, CKMBINDEX, TROPONINI in the last 168 hours. BNP: Invalid input(s): POCBNP CBG: Recent Labs  Lab 08/12/20 1708 08/12/20 2137 08/13/20 0608 08/13/20 1159 08/13/20 1627  GLUCAP 133* 138* 128* 151* 81   D-Dimer No results for input(s): DDIMER in the last 72 hours. Hgb A1c No results for input(s): HGBA1C in the last 72 hours. Lipid Profile No results for input(s): CHOL, HDL, LDLCALC, TRIG, CHOLHDL, LDLDIRECT in the last 72 hours. Thyroid function studies Recent Labs    08/13/20 1107  TSH 1.183   Anemia work up No results for input(s): VITAMINB12, FOLATE, FERRITIN, TIBC, IRON, RETICCTPCT in the last 72 hours. Urinalysis    Component Value Date/Time   COLORURINE YELLOW 08/13/2020 0536   APPEARANCEUR CLOUDY (A) 08/13/2020  0536   LABSPEC 1.020 08/13/2020 0536   PHURINE 5.0 08/13/2020 0536   GLUCOSEU 50 (A) 08/13/2020 0536   HGBUR MODERATE (A) 08/13/2020  0536   BILIRUBINUR NEGATIVE 08/13/2020 0536   KETONESUR NEGATIVE 08/13/2020 0536   PROTEINUR 100 (A) 08/13/2020 0536   NITRITE NEGATIVE 08/13/2020 0536   LEUKOCYTESUR NEGATIVE 08/13/2020 0536   Sepsis Labs Invalid input(s): PROCALCITONIN,  WBC,  LACTICIDVEN Microbiology Recent Results (from the past 240 hour(s))  Resp Panel by RT-PCR (Flu A&B, Covid) Nasopharyngeal Swab     Status: None   Collection Time: 08/11/20  2:16 PM   Specimen: Nasopharyngeal Swab; Nasopharyngeal(NP) swabs in vial transport medium  Result Value Ref Range Status   SARS Coronavirus 2 by RT PCR NEGATIVE NEGATIVE Final    Comment: (NOTE) SARS-CoV-2 target nucleic acids are NOT DETECTED.  The SARS-CoV-2 RNA is generally detectable in upper respiratory specimens during the acute phase of infection. The lowest concentration of SARS-CoV-2 viral copies this assay can detect is 138 copies/mL. A negative result does not preclude SARS-Cov-2 infection and should not be used as the sole basis for treatment or other patient management decisions. A negative result may occur with  improper specimen collection/handling, submission of specimen other than nasopharyngeal swab, presence of viral mutation(s) within the areas targeted by this assay, and inadequate number of viral copies(<138 copies/mL). A negative result must be combined with clinical observations, patient history, and epidemiological information. The expected result is Negative.  Fact Sheet for Patients:  EntrepreneurPulse.com.au  Fact Sheet for Healthcare Providers:  IncredibleEmployment.be  This test is no t yet approved or cleared by the [place] FDA and  has been authorized for detection and/or diagnosis of SARS-CoV-2 by FDA under an Emergency Use Authorization (EUA). This EUA will remain  in effect (meaning this test can be used) for the duration of the COVID-19 declaration under Section 564(b)(1) of the Act,  21 U.S.C.section 360bbb-3(b)(1), unless the authorization is terminated  or revoked sooner.       Influenza A by PCR NEGATIVE NEGATIVE Final   Influenza B by PCR NEGATIVE NEGATIVE Final    Comment: (NOTE) The Xpert Xpress SARS-CoV-2/FLU/RSV plus assay is intended as an aid in the diagnosis of influenza from Nasopharyngeal swab specimens and should not be used as a sole basis for treatment. Nasal washings and aspirates are unacceptable for Xpert Xpress SARS-CoV-2/FLU/RSV testing.  Fact Sheet for Patients: EntrepreneurPulse.com.au  Fact Sheet for Healthcare Providers: IncredibleEmployment.be  This test is not yet approved or cleared by the [place] FDA and has been authorized for detection and/or diagnosis of SARS-CoV-2 by FDA under an Emergency Use Authorization (EUA). This EUA will remain in effect (meaning this test can be used) for the duration of the COVID-19 declaration under Section 564(b)(1) of the Act, 21 U.S.C. section 360bbb-3(b)(1), unless the authorization is terminated or revoked.  Performed at [facility], [address] [place], [place] [address]      Time coordinating discharge: 35  minutes  SIGNED: Antonieta Pert, MD  [facility] 08/13/2020, 4:51 PM  If 7PM-7AM, please contact night-coverage [email or web address]

## 2020-08-13 NOTE — Progress Notes (Signed)
Physical Therapy Treatment Patient Details Name: [name] MRN: [id number] DOB: [birth year] Today's Date: 08/13/2020    History of Present Illness 86 y.o. female with medical history significant of CAD status post stent x2, diabetes, hypertension, hyperlipidemia, CKD, memory impairment who presents to ED on 11/21 with worsening shortness of breath, O2 desaturations, bilateral pleural effusions, and edema. Workup for new onset HF in exacerbation. Echo demonstrates EF of 55-60%, mild MVR, and mild pericardial effusion.    PT Comments    Pt progressing steadily towards her physical therapy goals, exhibiting improved activity tolerance and ambulation distance today. Pt able to participate in warm up exercises for BLE's and ambulate 150 feet with a walker at a min assist level. Desaturation to 85% on RA halfway through walk, but able to rebound to 90% with  Cues for a standing rest break and pursed lip breathing. D/c plan remains appropriate.     Follow Up Recommendations  Home health PT;Supervision/Assistance - 24 hour     Equipment Recommendations  None recommended by PT    Recommendations for Other Services       Precautions / Restrictions Precautions Precautions: Fall Restrictions Weight Bearing Restrictions: No    Mobility  Bed Mobility Overal bed mobility: Needs Assistance Bed Mobility: Supine to Sit     Supine to sit: Min guard        Transfers Overall transfer level: Needs assistance Equipment used: Rolling walker (2 wheeled) Transfers: Sit to/from Stand Sit to Stand: Min assist         General transfer comment: Light minA to power up from edge of bed  Ambulation/Gait Ambulation/Gait assistance: Min assist Gait Distance (Feet): 150 Feet Assistive device: Rolling walker (2 wheeled) Gait Pattern/deviations: Step-through pattern;Decreased stride length;Trunk flexed     General Gait Details: Repeated cues for walker proximity, activity pacing, up to min  assist for walker negotiation around obstacles and with turns   Chief Strategy Officer    Modified Rankin (Stroke Patients Only)       Balance Overall balance assessment: Needs assistance Sitting-balance support: Feet unsupported Sitting balance-Leahy Scale: Fair     Standing balance support: Bilateral upper extremity supported;During functional activity Standing balance-Leahy Scale: Poor                              Cognition Arousal/Alertness: Awake/alert Behavior During Therapy: WFL for tasks assessed/performed Overall Cognitive Status: Within Functional Limits for tasks assessed                                        Exercises General Exercises - Lower Extremity Long Arc Quad: Both;10 reps;Supine Heel Slides: Both;10 reps;Supine Straight Leg Raises: Both;10 reps;Supine    General Comments        Pertinent Vitals/Pain Pain Assessment: No/denies pain    Home Living                      Prior Function            PT Goals (current goals can now be found in the care plan section) Acute Rehab PT Goals Patient Stated Goal: get stronger Potential to Achieve Goals: Good Progress towards PT goals: Progressing toward goals    Frequency    Min 3X/week  PT Plan Current plan remains appropriate    Co-evaluation              AM-PAC PT "6 Clicks" Mobility   Outcome Measure  Help needed turning from your back to your side while in a flat bed without using bedrails?: None Help needed moving from lying on your back to sitting on the side of a flat bed without using bedrails?: A Little Help needed moving to and from a bed to a chair (including a wheelchair)?: A Little Help needed standing up from a chair using your arms (e.g., wheelchair or bedside chair)?: A Little Help needed to walk in hospital room?: A Little Help needed climbing 3-5 steps with a railing? : A Lot 6 Click Score: 18     End of Session Equipment Utilized During Treatment: Gait belt Activity Tolerance: Patient tolerated treatment well Patient left: in chair;with call bell/phone within reach;with family/visitor present Nurse Communication: Mobility status PT Visit Diagnosis: Other abnormalities of gait and mobility (R26.89);Difficulty in walking, not elsewhere classified (R26.2)     Time: 8022-3361 PT Time Calculation (min) (ACUTE ONLY): 24 min  Charges:  $Gait Training: 8-22 mins $Therapeutic Exercise: 8-22 mins                     [name], PT, DPT Acute Rehabilitation Services Pager [phone] Office [phone]    [name] 08/13/2020, 12:55 PM

## 2020-08-13 NOTE — Procedures (Signed)
PROCEDURE SUMMARY:  Successful image-guided right thoracentesis. Yielded 650 milliliters of clear yellow fluid. Patient tolerated procedure well. EBL: <1 mL No immediate complications.  Specimen was sent for labs. Post procedure CXR pending.  Please see imaging section of Epic for full dictation.  Joaquim Nam PA-C 08/13/2020 3:08 PM

## 2020-08-14 DIAGNOSIS — E7849 Other hyperlipidemia: Secondary | ICD-10-CM

## 2020-08-14 DIAGNOSIS — I2510 Atherosclerotic heart disease of native coronary artery without angina pectoris: Secondary | ICD-10-CM

## 2020-08-14 DIAGNOSIS — N1831 Chronic kidney disease, stage 3a: Secondary | ICD-10-CM

## 2020-08-14 DIAGNOSIS — R413 Other amnesia: Secondary | ICD-10-CM

## 2020-08-14 DIAGNOSIS — I10 Essential (primary) hypertension: Secondary | ICD-10-CM

## 2020-08-14 DIAGNOSIS — J90 Pleural effusion, not elsewhere classified: Secondary | ICD-10-CM

## 2020-08-14 DIAGNOSIS — Z955 Presence of coronary angioplasty implant and graft: Secondary | ICD-10-CM

## 2020-08-14 DIAGNOSIS — E119 Type 2 diabetes mellitus without complications: Secondary | ICD-10-CM

## 2020-08-14 DIAGNOSIS — I509 Heart failure, unspecified: Secondary | ICD-10-CM | POA: Diagnosis not present

## 2020-08-14 LAB — MAGNESIUM: Magnesium: 2.2 mg/dL (ref 1.7–2.4)

## 2020-08-14 LAB — BASIC METABOLIC PANEL
Anion gap: 9 (ref 5–15)
BUN: 30 mg/dL — ABNORMAL HIGH (ref 8–23)
CO2: 28 mmol/L (ref 22–32)
Calcium: 9 mg/dL (ref 8.9–10.3)
Chloride: 99 mmol/L (ref 98–111)
Creatinine, Ser: 1.7 mg/dL — ABNORMAL HIGH (ref 0.44–1.00)
GFR, Estimated: 29 mL/min — ABNORMAL LOW (ref >60)
Glucose, Bld: 143 mg/dL — ABNORMAL HIGH (ref 70–99)
Potassium: 3.3 mmol/L — ABNORMAL LOW (ref 3.5–5.1)
Sodium: 136 mmol/L (ref 135–145)

## 2020-08-14 LAB — CBC
HCT: 29.6 % — ABNORMAL LOW (ref 36.0–46.0)
Hemoglobin: 9.4 g/dL — ABNORMAL LOW (ref 12.0–15.0)
MCH: 32.6 pg (ref 26.0–34.0)
MCHC: 31.8 g/dL (ref 30.0–36.0)
MCV: 102.8 fL — ABNORMAL HIGH (ref 80.0–100.0)
Platelets: 331 10*3/uL (ref 150–400)
RBC: 2.88 MIL/uL — ABNORMAL LOW (ref 3.87–5.11)
RDW: 14.8 % (ref 11.5–15.5)
WBC: 10.5 10*3/uL (ref 4.0–10.5)
nRBC: 0 % (ref 0.0–0.2)

## 2020-08-14 LAB — GLUCOSE, CAPILLARY
Glucose-Capillary: 142 mg/dL — ABNORMAL HIGH (ref 70–99)
Glucose-Capillary: 124 mg/dL — ABNORMAL HIGH (ref 70–99)

## 2020-08-14 MED ORDER — POTASSIUM CHLORIDE 20 MEQ PO PACK
40.0000 meq | PACK | Freq: Once | ORAL | Status: AC
  Administered 2020-08-14: 40 meq via ORAL
  Filled 2020-08-14: qty 2

## 2020-08-14 NOTE — TOC Transition Note (Addendum)
Transition of Care [facility]) - CM/SW Discharge Note   Patient Details  Name: [name] MRN: [id number] Date of Birth: 1932/02/16  Transition of Care [facility]) CM/SW Contact:  Zenon Mayo, RN Phone Number: 08/14/2020, 9:59 AM   Clinical Narrative:    NCM spoke with patient , offered choice for HHPT, she states she does not have a preference.  NCM made referral to [facility] with [facility].  Patient states she does not want [facility] or [place].  Soc will begin 24 to 48 hrs post dc.  She has a rolling walker at home. She lives with her daughter. She has transportation at [employer] and she has no issues with getting her medications.  Daughter came by and states she would like for her to have [facility].  NCM made referral to Amy with [facility] for HHPT, she is able to take referral.  NCM canceled referral with [facility].   Final next level of care: [place] Barriers to Discharge: No Barriers Identified   Patient Goals and CMS Choice Patient states their goals for this hospitalization and ongoing recovery are:: get better CMS Medicare.gov Compare Post Acute Care list provided to:: Patient Choice offered to / list presented to : Patient  Discharge Placement                       Discharge Plan and Services                  DME Agency: NA       HH Arranged: PT HH Agency: [place] Date [place]: 08/14/20 Time [place]: [phone] Representative spoke with at [place]: [place] Interventions     Readmission Risk Interventions No flowsheet data found.

## 2020-08-14 NOTE — Progress Notes (Signed)
D/C instructions given and reviewed. No questions asked but encouraged to call with any concerns. Tele and IV removed, tolerated well. 

## 2020-08-14 NOTE — Discharge Summary (Signed)
Physician Discharge Summary  [name] JTT:[id number] DOB: 05/02/1932 DOA: 08/11/2020  PCP: Michael Boston, MD  Admit date: 08/11/2020 Discharge date: 08/14/2020  Admitted From: Home Disposition: Home  Recommendations for Outpatient Follow-up:  1. Follow up with PCP in 1-2 weeks 2. Please obtain BMP/CBC in one week 3. Follow-up with cardiology, nephrology to schedule  Home Health: Yes Equipment/Devices: None Discharge Condition: Stable CODE STATUS: Full code Diet recommendation: Low-sodium diet, carb consistent diet  Brief/Interim Summary: Patient is 86 year old female with past medical history of coronary artery disease status post stent placement, type 2 diabetes mellitus, hypertension, hyperlipidemia, CKD, memory impairment presented with worsening shortness of breath and edema, fatigue, not feeling herself, hypoxia with oxygen saturation of 70% on room air.  ED course: Vital signs notable for heart rate in 40s to 50s blood blood pressure in 009Q to 330Q systolic.  CMP showed creatinine of 1.6.  CBC showed hemoglobin of 10.8.  Lipase: WNL, lactic acid: WNL, BNP: 1000, troponin flat at 18 and 19.  COVID-19 and flu: Negative.  Chest x-ray showed bilateral pleural effusion and CT chest showed moderate right effusion and small to moderate left effusion with pulmonary congestion and cardiomegaly.  Patient received 40 mg of IV Lasix in ED and admitted for further evaluation and management of new onset CHF.  Discharge Diagnoses:  New onset CHF with preserved ejection fraction: -Patient was placed on Lasix IV on admission.  BNP elevated.  She improved with diuresis.  Weight down by 10 pounds.  No longer needing oxygen. -Patient will be discharged on low-dose of Lasix.  Follow-up with PCP and nephrology to monitor renal function.  Provided with CHF discharge instruction to monitor weight, watch for fluid overload, 2 g salt restriction and 1500 mL of fluid restriction and monitor symptoms.   Follow-up with cardiology outpatient  Bilateral pleural effusion: Reviewed chest x-ray.  Reviewed echo. -With recent left-sided Pleurx status secondary to chest wall contusion/rib fracture.  X-ray in June showed only trace bilateral effusion.  Diuresed and repeat chest x-ray showed right-sided pleural effusion worse than left.  Thoracentesis performed.  650 mL of clear yellow fluid obtained.  Patient tolerated procedure very well.  No immediate complications reported.  Repeat chest x-ray after the procedure showed no pneumothorax.  Acute hypoxemic respiratory failure: In the setting of new onset CHF and bilateral pleural effusion.  Resolved.  Patient maintained oxygen saturation on room air on the day of admission.  CKD stage IIIb: Creatinine baseline around 1.6 in October 2021.  Monitor kidney function while on Lasix.  Follow-up with [facility].  Repeat BMP in 1 week  Hypokalemia: Replenished.  Magnesium level: WNL.  Repeat BMP on follow-up visit  Coronary artery disease with history of stent placement/hypertension/hyperlipidemia: Patient remained chest pain-free troponin: Flat at 18 and 19.  Continued home dose of losartan, Plavix and statin.  Metoprolol was held due to bradycardia.  Memory impairment: Continued Aricept. -PT/OT consulted.  Recommended home health PT.  Type 2 diabetes mellitus without complication: T6A 2.6%.  Continued home meds.  Patient was discharged on 11/23 however after thoracentesis patient was woozy therefore daughter was not feeling comfortable taking patient home.  Patient discharged home on 11/24 in a stable condition.  Discharge Instructions  Discharge Instructions    (HEART FAILURE PATIENTS) Call MD:  Anytime you have any of the following symptoms: 1) 3 pound weight gain in 24 hours or 5 pounds in 1 week 2) shortness of breath, with or without a dry hacking  cough 3) swelling in the hands, feet or stomach 4) if you have to sleep on extra pillows at  night in order to breathe.   Complete by: As directed    Diet - low sodium heart healthy   Complete by: As directed    1500 ml fluid restriction.\ 2 gm salt restriciton   Discharge instructions   Complete by: As directed    Your Actos has been held due to CHF/fluid retention, please discuss with your primary care doctor for alternative medication for diabetes.  You have been prescribed Lasix for 3 weeks and please follow-up with PCP/cardiology to resume further, and will need follow-up BMP next week  Please call call MD or return to ER for similar or worsening recurring problem that brought you to hospital or if any fever,nausea/vomiting,abdominal pain, uncontrolled pain, chest pain,  shortness of breath or any other alarming symptoms.  Please follow-up your doctor as instructed in a week time and call the office for appointment.  Please avoid alcohol, smoking, or any other illicit substance and maintain healthy habits including taking your regular medications as prescribed.  You were cared for by a hospitalist during your hospital stay. If you have any questions about your discharge medications or the care you received while you were in the hospital after you are discharged, you can call the unit and ask to speak with the hospitalist on call if the hospitalist that took care of you is not available.  Once you are discharged, your primary care physician will handle any further medical issues. Please note that NO REFILLS for any discharge medications will be authorized once you are discharged, as it is imperative that you return to your primary care physician (or establish a relationship with a primary care physician if you do not have one) for your aftercare needs so that they can reassess your need for medications and monitor your lab values   Discharge instructions   Complete by: As directed    Follow-up with PCP in 1 week Follow-up with cardiology, nephrology as a scheduled Repeat CBC and  BMP on follow-up visit.   Increase activity slowly   Complete by: As directed    Increase activity slowly   Complete by: As directed      Allergies as of 08/14/2020      Reactions   Tape Other (See Comments)   SKIN IS VERY THIN AND TEARS EXTREMELY EASILY!!   Lisinopril Cough   Nitrofuran Derivatives Nausea And Vomiting, Other (See Comments)   Dizziness, also      Medication List    STOP taking these medications   Metoprolol Tartrate 75 MG Tabs   pioglitazone 15 MG tablet Commonly known as: ACTOS     TAKE these medications   Biotin 5000 MCG Tabs Take 5,000 mcg by mouth daily.   clopidogrel 75 MG tablet Commonly known as: PLAVIX Take 75 mg by mouth every Monday, Wednesday, and Friday.   donepezil 10 MG tablet Commonly known as: ARICEPT Take 1 tablet (10 mg total) by mouth at bedtime.   furosemide 20 MG tablet Commonly known as: LASIX Take 1 tablet (20 mg total) by mouth daily for 21 days. Discuss with the cardiology for further resumption of diuretics   levothyroxine 25 MCG tablet Commonly known as: SYNTHROID Take 25 mcg by mouth daily before breakfast.   losartan 100 MG tablet Commonly known as: COZAAR Take 50 mg by mouth at bedtime.   rosuvastatin 40 MG tablet Commonly known as: CRESTOR  Take 40 mg by mouth at bedtime.   sertraline 100 MG tablet Commonly known as: ZOLOFT Take 100 mg by mouth at bedtime.   Vitamin D3 Super Strength 50 MCG (2000 UT) Tabs Generic drug: Cholecalciferol Take 2,000 Units by mouth daily.       Follow-up Information    Schedule an appointment as soon as possible for a visit with Jacalyn Lefevre Jesse Sans, MD.   Specialty: Internal Medicine Why: bmp check in 5 days Contact information: [place] [address] [phone]        Geralynn Rile, MD On 08/30/2020.   Specialties: Internal Medicine, Cardiology, Radiology Why: @ 10:20 Contact information: [place] [address] [phone]         Care, [facility] Follow up.   Specialty: Home Health Services Why: HHPT Contact information: [address]  [place] [address] [phone]              Allergies  Allergen Reactions  . Tape Other (See Comments)    SKIN IS VERY THIN AND TEARS EXTREMELY EASILY!!  . Lisinopril Cough  . Nitrofuran Derivatives Nausea And Vomiting and Other (See Comments)    Dizziness, also    Consultations:  IR   Procedures/Studies: DG Chest 1 View  Result Date: 08/13/2020 CLINICAL DATA:  86 year old female status post thoracentesis. EXAM: CHEST  1 VIEW COMPARISON:  Chest radiograph dated 08/13/2020. FINDINGS: Significant interval decrease in the size of the right pleural effusion with improved aeration of the right lung. Loculated pleural effusion along the left lateral lung noted. There is improved aeration of the left lung base. No pneumothorax. Stable cardiac silhouette. Atherosclerotic calcification of the aorta. No acute osseous pathology. IMPRESSION: Significant interval decrease in the size of the right pleural effusion with improved aeration of the right lung. No pneumothorax. Electronically Signed   By: Anner Crete M.D.   On: 08/13/2020 17:19   DG Chest 2 View  Result Date: 08/13/2020 CLINICAL DATA:  Shortness of breath, follow-up pleural effusion EXAM: CHEST - 2 VIEW COMPARISON:  08/11/2020 FINDINGS: Cardiac shadow is mildly enlarged but stable. Bilateral pleural effusions are noted right greater than left with some loculated components on the left similar to that seen on recent CT examination. Right-sided effusion is increased in the interval from the prior study. Aortic calcifications are noted. No new focal infiltrate is seen. No acute bony abnormality is noted. IMPRESSION: Bilateral pleural effusions right greater than left. The right-sided effusion has increased in the interval from the prior exam. Electronically Signed   By: Inez Catalina M.D.   On:  08/13/2020 08:09   CT CHEST WO CONTRAST  Result Date: 08/11/2020 CLINICAL DATA:  Cardiomegaly. Follow-up. Patient felt tired today. Wheezing and cough. EXAM: CT CHEST WITHOUT CONTRAST TECHNIQUE: Multidetector CT imaging of the chest was performed following the standard protocol without IV contrast. COMPARISON:  Chest x-ray August 11, 2020.  Chest CT March 16, 2020. FINDINGS: Cardiovascular: Cardiomegaly. Three-vessel coronary artery disease. The thoracic aorta demonstrates moderate atherosclerotic change without aneurysm. Central pulmonary arteries are unremarkable. Mediastinum/Nodes: There is a moderate right-sided pleural effusion which appears to be free-flowing. There is a small to moderate left-sided pleural effusion with loculated components. There is a tiny pericardial effusion. The patient appears to be status post resection of the left thyroid lobe. The remaining right thyroid lobe is grossly unremarkable. The esophagus is normal. No adenopathy. The chest wall is normal in appearance. Lungs/Pleura: There is atelectasis associated with  the bilateral effusions. Scarring in the apices is stable. Minimal interlobular septal thickening suggests pulmonary venous congestion. No evidence of pneumonia. No nodule or mass. Upper Abdomen: No acute abnormality. Musculoskeletal: No chest wall mass or suspicious bone lesions identified. IMPRESSION: 1. There is a moderate free-flowing right-sided pleural effusion. 2. There is a small to moderate left-sided pleural effusion with loculated components. 3. Cardiomegaly and mild pulmonary venous congestion. 4. Thoracic aortic atherosclerosis.  Coronary artery disease. 5. No other acute abnormalities. Aortic Atherosclerosis (ICD10-I70.0). Electronically Signed   By: Dorise Bullion III M.D   On: 08/11/2020 17:19   DG Chest Port 1 View  Result Date: 08/11/2020 CLINICAL DATA:  Fatigue, hypoxia, wheezing, cough EXAM: PORTABLE CHEST 1 VIEW COMPARISON:  03/19/2020  FINDINGS: There are moderate bilateral layering pleural effusions and associated atelectasis or consolidation. Cardiomegaly. IMPRESSION: 1. There are moderate bilateral layering pleural effusions and associated atelectasis or consolidation. 2. Cardiomegaly. Electronically Signed   By: Eddie Candle M.D.   On: 08/11/2020 14:56   ECHOCARDIOGRAM COMPLETE  Result Date: 08/12/2020    ECHOCARDIOGRAM REPORT   Patient Name:   [name] Date of Exam: 08/12/2020 Medical Rec #:  [id number]        Height:       67.0 in Accession #:    [id number]       Weight:       114.3 lb Date of Birth:  [birth year]         BSA:          1.594 m Patient Age:    69 years         BP:           146/60 mmHg Patient Gender: F                HR:           52 bpm. Exam Location:  Inpatient Procedure: 2D Echo, Cardiac Doppler and Color Doppler Indications:    Dyspnea  History:        Patient has no prior history of Echocardiogram examinations. CAD                 and Previous Myocardial Infarction, Signs/Symptoms:Shortness of                 Breath; Risk Factors:Hypertension, Diabetes and Dyslipidemia.                 CKD.  Sonographer:    Clayton Lefort RDCS (AE) Referring Phys: [id number] [place]  1. Left ventricular ejection fraction, by estimation, is 55 to 60%. The left ventricle has normal function. The left ventricle has no regional wall motion abnormalities. There is mild left ventricular hypertrophy. Left ventricular diastolic parameters are indeterminate.  2. Right ventricular systolic function is normal. The right ventricular size is normal. There is moderately elevated pulmonary artery systolic pressure. The estimated right ventricular systolic pressure is 52.8 mmHg.  3. Trivial to small pericardial effusion. The pericardial effusion is circumferential. There is no evidence of cardiac tamponade.  4. The mitral valve is abnormal. Mild mitral valve regurgitation.  5. The aortic valve is tricuspid. Aortic valve  regurgitation is not visualized. Mild aortic valve sclerosis is present, with no evidence of aortic valve stenosis.  6. The inferior vena cava is normal in size with greater than 50% respiratory variability, suggesting right atrial pressure of 3 mmHg. Comparison(s): No prior Echocardiogram. FINDINGS  Left Ventricle: Left ventricular ejection fraction, by estimation, is  55 to 60%. The left ventricle has normal function. The left ventricle has no regional wall motion abnormalities. The left ventricular internal cavity size was normal in size. There is  mild left ventricular hypertrophy. Left ventricular diastolic parameters are indeterminate. Right Ventricle: The right ventricular size is normal. No increase in right ventricular wall thickness. Right ventricular systolic function is normal. There is moderately elevated pulmonary artery systolic pressure. The tricuspid regurgitant velocity is 3.64 m/s, and with an assumed right atrial pressure of 3 mmHg, the estimated right ventricular systolic pressure is 91.7 mmHg. Left Atrium: Left atrial size was normal in size. Right Atrium: Right atrial size was normal in size. Pericardium: Trivial to small pericardial effusion. The pericardial effusion is circumferential. There is no evidence of cardiac tamponade. Mitral Valve: The mitral valve is abnormal. There is moderate calcification of the posterior mitral valve leaflet(s). Mild mitral valve regurgitation. MV peak gradient, 5.1 mmHg. The mean mitral valve gradient is 1.0 mmHg. Tricuspid Valve: The tricuspid valve is grossly normal. Tricuspid valve regurgitation is mild. Aortic Valve: The aortic valve is tricuspid. Aortic valve regurgitation is not visualized. Mild aortic valve sclerosis is present, with no evidence of aortic valve stenosis. Aortic valve mean gradient measures 5.0 mmHg. Aortic valve peak gradient measures 10.4 mmHg. Aortic valve area, by VTI measures 2.00 cm. Pulmonic Valve: The pulmonic valve was grossly  normal. Pulmonic valve regurgitation is trivial. Aorta: The aortic root and ascending aorta are structurally normal, with no evidence of dilitation. Venous: The inferior vena cava is normal in size with greater than 50% respiratory variability, suggesting right atrial pressure of 3 mmHg. IAS/Shunts: No atrial level shunt detected by color flow Doppler. Additional Comments: There is a small pleural effusion in both left and right lateral regions.  LEFT VENTRICLE PLAX 2D LVIDd:         3.90 cm  Diastology LVIDs:         2.70 cm  LV e' medial:    4.68 cm/s LV PW:         1.10 cm  LV E/e' medial:  21.8 LV IVS:        1.10 cm  LV e' lateral:   4.06 cm/s LVOT diam:     2.00 cm  LV E/e' lateral: 25.1 LV SV:         73 LV SV Index:   46 LVOT Area:     3.14 cm  RIGHT VENTRICLE             IVC RV Basal diam:  2.50 cm     IVC diam: 1.40 cm RV S prime:     11.60 cm/s TAPSE (M-mode): 1.6 cm LEFT ATRIUM             Index       RIGHT ATRIUM           Index LA diam:        4.00 cm 2.51 cm/m  RA Area:     10.40 cm LA Vol (A2C):   50.8 ml 31.86 ml/m RA Volume:   21.50 ml  13.49 ml/m LA Vol (A4C):   48.9 ml 30.67 ml/m LA Biplane Vol: 49.8 ml 31.24 ml/m  AORTIC VALVE AV Area (Vmax):    1.93 cm AV Area (Vmean):   1.87 cm AV Area (VTI):     2.00 cm AV Vmax:           161.00 cm/s AV Vmean:  102.000 cm/s AV VTI:            0.363 m AV Peak Grad:      10.4 mmHg AV Mean Grad:      5.0 mmHg LVOT Vmax:         99.00 cm/s LVOT Vmean:        60.600 cm/s LVOT VTI:          0.231 m LVOT/AV VTI ratio: 0.64  AORTA Ao Root diam: 2.90 cm Ao Asc diam:  3.20 cm MITRAL VALVE                TRICUSPID VALVE MV Area (PHT): 3.21 cm     TR Peak grad:   53.0 mmHg MV Peak grad:  5.1 mmHg     TR Vmax:        364.00 cm/s MV Mean grad:  1.0 mmHg MV Vmax:       1.13 m/s     SHUNTS MV Vmean:      52.5 cm/s    Systemic VTI:  0.23 m MV Decel Time: 236 msec     Systemic Diam: 2.00 cm MR Peak grad: 134.1 mmHg MR Mean grad: 90.0 mmHg MR Vmax:       579.00 cm/s MR Vmean:     450.0 cm/s MV E velocity: 102.00 cm/s MV A velocity: 52.00 cm/s MV E/A ratio:  1.96 Lyman Bishop MD Electronically signed by Lyman Bishop MD Signature Date/Time: 08/12/2020/12:48:51 PM    Final    IR THORACENTESIS ASP PLEURAL SPACE W/IMG GUIDE  Result Date: 08/13/2020 INDICATION: Patient with history of congestive heart failure, recurrent pleural effusions. Imaging shows bilateral pleural effusions right greater than left-request to IR for diagnostic and therapeutic thoracentesis. EXAM: ULTRASOUND GUIDED RIGHT THORACENTESIS MEDICATIONS: 7 mL 1% lidocaine COMPLICATIONS: None immediate. PROCEDURE: An ultrasound guided thoracentesis was thoroughly discussed with the patient and questions answered. The benefits, risks, alternatives and complications were also discussed. The patient understands and wishes to proceed with the procedure. Written consent was obtained. Ultrasound was performed to localize and mark an adequate pocket of fluid in the right chest. The area was then prepped and draped in the normal sterile fashion. 1% Lidocaine was used for local anesthesia. Under ultrasound guidance a 6 Fr Safe-T-Centesis catheter was introduced. Thoracentesis was performed. The catheter was removed and a dressing applied. FINDINGS: A total of approximately 650 mL of clear yellow fluid was removed. Samples were sent to the laboratory as requested by the clinical team. IMPRESSION: Successful ultrasound guided right thoracentesis yielding 650 mL of pleural fluid. Read by Candiss Norse, PA-C Electronically Signed   By: Markus Daft M.D.   On: 08/13/2020 15:33       Subjective: Patient seen and examined.  Daughter at bedside.  Sitting comfortably on the chair.  Reports that she is doing fine, denies any new complaints including shortness of breath, chest pain, leg swelling, orthopnea or PND.  Tells me that she is comfortable going home.  Discharge Exam: Vitals:   08/14/20 0818 08/14/20  0900  BP: (!) 106/53   Pulse: 64   Resp: 20 19  Temp:    SpO2: 100%    Vitals:   08/13/20 2036 08/14/20 0617 08/14/20 0818 08/14/20 0900  BP: (!) 132/47 (!) 126/56 (!) 106/53   Pulse: 65 (!) 57 64   Resp: 17 18 20 19   Temp: 98.6 F (37 C) 98.2 F (36.8 C)    TempSrc: Oral Oral    SpO2: 96%  96% 100%   Weight:  49.8 kg    Height:        General: Pt is alert, awake, not in acute distress, on room air, communicating well. Cardiovascular: RRR, S1/S2 +, no rubs, no gallops Respiratory: CTA bilaterally, no wheezing, no rhonchi Abdominal: Soft, NT, ND, bowel sounds + Extremities: no edema, no cyanosis    The results of significant diagnostics from this hospitalization (including imaging, microbiology, ancillary and laboratory) are listed below for reference.     Microbiology: Recent Results (from the past 240 hour(s))  Resp Panel by RT-PCR (Flu A&B, Covid) Nasopharyngeal Swab     Status: None   Collection Time: 08/11/20  2:16 PM   Specimen: Nasopharyngeal Swab; Nasopharyngeal(NP) swabs in vial transport medium  Result Value Ref Range Status   SARS Coronavirus 2 by RT PCR NEGATIVE NEGATIVE Final    Comment: (NOTE) SARS-CoV-2 target nucleic acids are NOT DETECTED.  The SARS-CoV-2 RNA is generally detectable in upper respiratory specimens during the acute phase of infection. The lowest concentration of SARS-CoV-2 viral copies this assay can detect is 138 copies/mL. A negative result does not preclude SARS-Cov-2 infection and should not be used as the sole basis for treatment or other patient management decisions. A negative result may occur with  improper specimen collection/handling, submission of specimen other than nasopharyngeal swab, presence of viral mutation(s) within the areas targeted by this assay, and inadequate number of viral copies(<138 copies/mL). A negative result must be combined with clinical observations, patient history, and epidemiological information.  The expected result is Negative.  Fact Sheet for Patients:  EntrepreneurPulse.com.au  Fact Sheet for Healthcare Providers:  IncredibleEmployment.be  This test is no t yet approved or cleared by the [place] FDA and  has been authorized for detection and/or diagnosis of SARS-CoV-2 by FDA under an Emergency Use Authorization (EUA). This EUA will remain  in effect (meaning this test can be used) for the duration of the COVID-19 declaration under Section 564(b)(1) of the Act, 21 U.S.C.section 360bbb-3(b)(1), unless the authorization is terminated  or revoked sooner.       Influenza A by PCR NEGATIVE NEGATIVE Final   Influenza B by PCR NEGATIVE NEGATIVE Final    Comment: (NOTE) The Xpert Xpress SARS-CoV-2/FLU/RSV plus assay is intended as an aid in the diagnosis of influenza from Nasopharyngeal swab specimens and should not be used as a sole basis for treatment. Nasal washings and aspirates are unacceptable for Xpert Xpress SARS-CoV-2/FLU/RSV testing.  Fact Sheet for Patients: EntrepreneurPulse.com.au  Fact Sheet for Healthcare Providers: IncredibleEmployment.be  This test is not yet approved or cleared by the [place] FDA and has been authorized for detection and/or diagnosis of SARS-CoV-2 by FDA under an Emergency Use Authorization (EUA). This EUA will remain in effect (meaning this test can be used) for the duration of the COVID-19 declaration under Section 564(b)(1) of the Act, 21 U.S.C. section 360bbb-3(b)(1), unless the authorization is terminated or revoked.  Performed at [facility], [address] [place], [place] [address]   Culture, Urine     Status: Abnormal   Collection Time: 08/13/20  5:03 AM   Specimen: Urine, Random  Result Value Ref Range Status   Specimen Description URINE, RANDOM  Final   Special Requests NONE  Final   Culture (A)  Final    <10,000 COLONIES/mL  INSIGNIFICANT GROWTH Performed at [facility], [place] [address], [place], [place] [address]    Report Status 08/13/2020 FINAL  Final  Gram stain     Status: None   Collection Time: 08/13/20  3:17 PM   Specimen: Lung, Right; Pleural Fluid  Result Value Ref Range Status   Specimen Description FLUID PLEURAL RIGHT LUNG  Final   Special Requests NONE  Final   Gram Stain   Final    RARE WBC PRESENT,BOTH PMN AND MONONUCLEAR NO ORGANISMS SEEN Performed at [facility], [address], [place], [place] [address]    Report Status 08/13/2020 FINAL  Final  Culture, body fluid-bottle     Status: None (Preliminary result)   Collection Time: 08/13/20  3:17 PM   Specimen: Fluid  Result Value Ref Range Status   Specimen Description FLUID PLEURAL RIGHT LUNG  Final   Special Requests   Final    BOTTLES DRAWN AEROBIC AND ANAEROBIC Blood Culture adequate volume   Culture   Final    NO GROWTH < 24 HOURS Performed at [facility], [place] [address] [place], [place] [address]    Report Status PENDING  Incomplete     Labs: BNP (last 3 results) Recent Labs    08/11/20 1359  BNP 4,196.2*   Basic Metabolic Panel: Recent Labs  Lab 08/11/20 1359 08/11/20 1410 08/12/20 0419 08/13/20 0254 08/14/20 0245 08/14/20 0721  NA 138 141 140 138 136  --   K 3.7 3.9 3.4* 3.5 3.3*  --   CL 105  --  101 98 99  --   CO2 23  --  26 27 28   --   GLUCOSE 187*  --  125* 138* 143*  --   BUN 23  --  22 26* 30*  --   CREATININE 1.60*  --  1.88* 1.76* 1.70*  --   CALCIUM 9.2  --  9.0 9.2 9.0  --   MG  --   --  2.2  --   --  2.2   Liver Function Tests: Recent Labs  Lab 08/11/20 1359  AST 22  ALT 29  ALKPHOS 70  BILITOT 0.8  PROT 7.2  ALBUMIN 3.7   Recent Labs  Lab 08/11/20 1359  LIPASE 51   No results for input(s): AMMONIA in the last 168 hours. CBC: Recent Labs  Lab 08/11/20 1359 08/11/20 1410 08/12/20 0419 08/13/20 0254 08/14/20 0245  WBC 7.6  --  7.6 7.1 10.5  NEUTROABS  6.0  --   --   --   --   HGB 10.8* 10.5* 9.2* 9.7* 9.4*  HCT 33.8* 31.0* 28.6* 30.3* 29.6*  MCV 102.7*  --  102.5* 101.0* 102.8*  PLT 375  --  314 334 331   Cardiac Enzymes: No results for input(s): CKTOTAL, CKMB, CKMBINDEX, TROPONINI in the last 168 hours. BNP: Invalid input(s): POCBNP CBG: Recent Labs  Lab 08/13/20 0608 08/13/20 1159 08/13/20 1627 08/13/20 2112 08/14/20 0602  GLUCAP 128* 151* 81 200* 142*   D-Dimer No results for input(s): DDIMER in the last 72 hours. Hgb A1c No results for input(s): HGBA1C in the last 72 hours. Lipid Profile No results for input(s): CHOL, HDL, LDLCALC, TRIG, CHOLHDL, LDLDIRECT in the last 72 hours. Thyroid function studies Recent Labs    08/13/20 1107  TSH 1.183   Anemia work up No results for input(s): VITAMINB12, FOLATE, FERRITIN, TIBC, IRON, RETICCTPCT in the last 72 hours. Urinalysis    Component Value Date/Time   COLORURINE YELLOW 08/13/2020 0536   APPEARANCEUR CLOUDY (A) 08/13/2020 0536   LABSPEC 1.020 08/13/2020 0536   PHURINE  5.0 08/13/2020 0536   GLUCOSEU 50 (A) 08/13/2020 0536   HGBUR MODERATE (A) 08/13/2020 0536   BILIRUBINUR NEGATIVE 08/13/2020 0536   KETONESUR NEGATIVE 08/13/2020 0536   PROTEINUR 100 (A) 08/13/2020 0536   NITRITE NEGATIVE 08/13/2020 0536   LEUKOCYTESUR NEGATIVE 08/13/2020 0536   Sepsis Labs Invalid input(s): PROCALCITONIN,  WBC,  LACTICIDVEN Microbiology Recent Results (from the past 240 hour(s))  Resp Panel by RT-PCR (Flu A&B, Covid) Nasopharyngeal Swab     Status: None   Collection Time: 08/11/20  2:16 PM   Specimen: Nasopharyngeal Swab; Nasopharyngeal(NP) swabs in vial transport medium  Result Value Ref Range Status   SARS Coronavirus 2 by RT PCR NEGATIVE NEGATIVE Final    Comment: (NOTE) SARS-CoV-2 target nucleic acids are NOT DETECTED.  The SARS-CoV-2 RNA is generally detectable in upper respiratory specimens during the acute phase of infection. The lowest concentration of SARS-CoV-2  viral copies this assay can detect is 138 copies/mL. A negative result does not preclude SARS-Cov-2 infection and should not be used as the sole basis for treatment or other patient management decisions. A negative result may occur with  improper specimen collection/handling, submission of specimen other than nasopharyngeal swab, presence of viral mutation(s) within the areas targeted by this assay, and inadequate number of viral copies(<138 copies/mL). A negative result must be combined with clinical observations, patient history, and epidemiological information. The expected result is Negative.  Fact Sheet for Patients:  EntrepreneurPulse.com.au  Fact Sheet for Healthcare Providers:  IncredibleEmployment.be  This test is no t yet approved or cleared by the [place] FDA and  has been authorized for detection and/or diagnosis of SARS-CoV-2 by FDA under an Emergency Use Authorization (EUA). This EUA will remain  in effect (meaning this test can be used) for the duration of the COVID-19 declaration under Section 564(b)(1) of the Act, 21 U.S.C.section 360bbb-3(b)(1), unless the authorization is terminated  or revoked sooner.       Influenza A by PCR NEGATIVE NEGATIVE Final   Influenza B by PCR NEGATIVE NEGATIVE Final    Comment: (NOTE) The Xpert Xpress SARS-CoV-2/FLU/RSV plus assay is intended as an aid in the diagnosis of influenza from Nasopharyngeal swab specimens and should not be used as a sole basis for treatment. Nasal washings and aspirates are unacceptable for Xpert Xpress SARS-CoV-2/FLU/RSV testing.  Fact Sheet for Patients: EntrepreneurPulse.com.au  Fact Sheet for Healthcare Providers: IncredibleEmployment.be  This test is not yet approved or cleared by the [place] FDA and has been authorized for detection and/or diagnosis of SARS-CoV-2 by FDA under an Emergency Use Authorization  (EUA). This EUA will remain in effect (meaning this test can be used) for the duration of the COVID-19 declaration under Section 564(b)(1) of the Act, 21 U.S.C. section 360bbb-3(b)(1), unless the authorization is terminated or revoked.  Performed at [facility], [address] [place], [place] [address]   Culture, Urine     Status: Abnormal   Collection Time: 08/13/20  5:03 AM   Specimen: Urine, Random  Result Value Ref Range Status   Specimen Description URINE, RANDOM  Final   Special Requests NONE  Final   Culture (A)  Final    <10,000 COLONIES/mL INSIGNIFICANT GROWTH Performed at [facility], [place] [address], [place], [place] [address]    Report Status 08/13/2020 FINAL  Final  Gram stain     Status: None   Collection Time: 08/13/20  3:17 PM   Specimen: Lung, Right; Pleural Fluid  Result Value  Ref Range Status   Specimen Description FLUID PLEURAL RIGHT LUNG  Final   Special Requests NONE  Final   Gram Stain   Final    RARE WBC PRESENT,BOTH PMN AND MONONUCLEAR NO ORGANISMS SEEN Performed at [facility], [address] [place], [place] [address]    Report Status 08/13/2020 FINAL  Final  Culture, body fluid-bottle     Status: None (Preliminary result)   Collection Time: 08/13/20  3:17 PM   Specimen: Fluid  Result Value Ref Range Status   Specimen Description FLUID PLEURAL RIGHT LUNG  Final   Special Requests   Final    BOTTLES DRAWN AEROBIC AND ANAEROBIC Blood Culture adequate volume   Culture   Final    NO GROWTH < 24 HOURS Performed at [facility], [place] [address], [place], [place] [address]    Report Status PENDING  Incomplete     Time coordinating discharge: Over 30 minutes  SIGNED:   Mckinley Jewel, MD  [facility] 08/14/2020, 10:47 AM Pager   If 7PM-7AM, please contact night-coverage [email or web address]

## 2020-08-14 NOTE — Evaluation (Signed)
Occupational Therapy Evaluation Patient Details Name: [name] MRN: [id number] DOB: 1932/09/12 Today's Date: 08/14/2020    History of Present Illness 86 y.o. female with medical history significant of CAD status post stent x2, diabetes, hypertension, hyperlipidemia, CKD, memory impairment who presents to ED on 11/21 with worsening shortness of breath, O2 desaturations, bilateral pleural effusions, and edema. Workup for new onset HF in exacerbation. Echo demonstrates EF of 55-60%, mild MVR, and mild pericardial effusion.   Clinical Impression   Pleasant female admitted with the above diagnosis.  She presents with known cognitive dysfunction and unsteadiness on her feet.  Currently she is requiring supervision for ADL and toileting/transfers at RW level.  Plan is for discharge today with [facility] services.  No further OT in the acute level as all needs can be addressed at home.     Follow Up Recommendations  Home health OT;Supervision/Assistance - 24 hour    Equipment Recommendations       Recommendations for Other Services       Precautions / Restrictions Precautions Precautions: Fall Restrictions Weight Bearing Restrictions: No      Mobility Bed Mobility Overal bed mobility: Modified Independent                  Transfers Overall transfer level: Needs assistance Equipment used: Rolling walker (2 wheeled) Transfers: Sit to/from Stand;Stand Pivot Transfers Sit to Stand: Supervision Stand pivot transfers: Supervision            Balance Overall balance assessment: Needs assistance Sitting-balance support: Feet supported Sitting balance-Leahy Scale: Good     Standing balance support: Bilateral upper extremity supported;During functional activity Standing balance-Leahy Scale: Poor                             ADL either performed or assessed with clinical judgement   ADL Overall ADL's : Needs assistance/impaired Eating/Feeding: Independent    Grooming: Wash/dry hands;Wash/dry face;Oral care;Supervision/safety;Standing   Upper Body Bathing: Supervision/ safety;Sitting   Lower Body Bathing: Supervison/ safety;Sit to/from stand   Upper Body Dressing : Supervision/safety;Sitting   Lower Body Dressing: Supervision/safety;Sit to/from stand   Toilet Transfer: Supervision/safety;RW                   Vision Baseline Vision/History: Wears glasses Wears Glasses: At all times Patient Visual Report: No change from baseline;Blurring of vision       Perception     Praxis      Pertinent Vitals/Pain Pain Assessment: No/denies pain     Hand Dominance Right   Extremity/Trunk Assessment Upper Extremity Assessment Upper Extremity Assessment: Overall WFL for tasks assessed       Cervical / Trunk Assessment Cervical / Trunk Assessment: Kyphotic   Communication Communication Communication: No difficulties   Cognition Arousal/Alertness: Awake/alert Behavior During Therapy: WFL for tasks assessed/performed Overall Cognitive Status: History of cognitive impairments - at baseline                                 General Comments: Increased processing time with documented history of memory difficulty, WFL and very pleasant during session   General Comments   VSS    Exercises     Shoulder Instructions      Home Living Family/patient expects to be discharged to:: Private residence Living Arrangements: Children Available Help at Discharge: Family;Available 24 hours/day Type of Home: House  Home Layout: One level     Bathroom Shower/Tub: Occupational psychologist: Handicapped height     Home Equipment: Environmental consultant - 2 wheels;Cane - single point;Shower seat          Prior Functioning/Environment Level of Independence: Independent with assistive device(s)        Comments: Patient requires increasing supervision for ADL, toilet skills and light home management due to cognitive  dysfunction.  She is slow and purposeful, VC's for direction, but doing well.        OT Problem List: Impaired balance (sitting and/or standing)      OT Treatment/Interventions:      OT Goals(Current goals can be found in the care plan section) Acute Rehab OT Goals Patient Stated Goal: Looking forward to going home OT Goal Formulation: With patient Time For Goal Achievement: 08/14/20  OT Frequency:     Barriers to D/C:  none noted          Co-evaluation              AM-PAC OT "6 Clicks" Daily Activity     Outcome Measure Help from another person eating meals?: None Help from another person taking care of personal grooming?: A Little Help from another person toileting, which includes using toliet, bedpan, or urinal?: A Little Help from another person bathing (including washing, rinsing, drying)?: A Little Help from another person to put on and taking off regular upper body clothing?: A Little Help from another person to put on and taking off regular lower body clothing?: A Little 6 Click Score: 19   End of Session Equipment Utilized During Treatment: Gait belt;Rolling walker Nurse Communication: Mobility status  Activity Tolerance: Patient tolerated treatment well Patient left: in bed;with call bell/phone within reach;with family/visitor present  OT Visit Diagnosis: Unsteadiness on feet (R26.81)                Time: 3143-8887 OT Time Calculation (min): 31 min Charges:  OT General Charges $OT Visit: 1 Visit OT Evaluation $OT Eval Moderate Complexity: 1 Mod OT Treatments $Self Care/Home Management : 8-22 mins  08/14/2020  Rich, OTR/L  Acute Rehabilitation Services  Office:  [phone]   Metta Clines 08/14/2020, 8:49 AM

## 2020-08-16 LAB — CYTOLOGY - NON PAP

## 2020-08-18 LAB — CULTURE, BODY FLUID W GRAM STAIN -BOTTLE
Culture: NO GROWTH
Special Requests: ADEQUATE

## 2020-08-23 ENCOUNTER — Other Ambulatory Visit: Payer: Self-pay | Admitting: Nephrology

## 2020-08-23 DIAGNOSIS — N1832 Chronic kidney disease, stage 3b: Secondary | ICD-10-CM

## 2020-08-23 DIAGNOSIS — R809 Proteinuria, unspecified: Secondary | ICD-10-CM

## 2020-08-26 ENCOUNTER — Encounter (HOSPITAL_BASED_OUTPATIENT_CLINIC_OR_DEPARTMENT_OTHER): Payer: Self-pay | Admitting: Emergency Medicine

## 2020-08-26 ENCOUNTER — Emergency Department (HOSPITAL_BASED_OUTPATIENT_CLINIC_OR_DEPARTMENT_OTHER): Payer: Medicare Other

## 2020-08-26 ENCOUNTER — Emergency Department (HOSPITAL_BASED_OUTPATIENT_CLINIC_OR_DEPARTMENT_OTHER)
Admission: EM | Admit: 2020-08-26 | Discharge: 2020-08-26 | Disposition: A | Discharge status: Home / Self Care | Payer: Medicare Other | Attending: Emergency Medicine | Admitting: Emergency Medicine

## 2020-08-26 ENCOUNTER — Other Ambulatory Visit: Payer: Self-pay

## 2020-08-26 DIAGNOSIS — W01198A Fall on same level from slipping, tripping and stumbling with subsequent striking against other object, initial encounter: Secondary | ICD-10-CM | POA: Diagnosis not present

## 2020-08-26 DIAGNOSIS — I2510 Atherosclerotic heart disease of native coronary artery without angina pectoris: Secondary | ICD-10-CM | POA: Diagnosis not present

## 2020-08-26 DIAGNOSIS — I130 Hypertensive heart and chronic kidney disease with heart failure and stage 1 through stage 4 chronic kidney disease, or unspecified chronic kidney disease: Secondary | ICD-10-CM | POA: Insufficient documentation

## 2020-08-26 DIAGNOSIS — S51012A Laceration without foreign body of left elbow, initial encounter: Secondary | ICD-10-CM | POA: Insufficient documentation

## 2020-08-26 DIAGNOSIS — E785 Hyperlipidemia, unspecified: Secondary | ICD-10-CM | POA: Insufficient documentation

## 2020-08-26 DIAGNOSIS — E1122 Type 2 diabetes mellitus with diabetic chronic kidney disease: Secondary | ICD-10-CM | POA: Diagnosis not present

## 2020-08-26 DIAGNOSIS — Z955 Presence of coronary angioplasty implant and graft: Secondary | ICD-10-CM | POA: Insufficient documentation

## 2020-08-26 DIAGNOSIS — S51811A Laceration without foreign body of right forearm, initial encounter: Secondary | ICD-10-CM | POA: Insufficient documentation

## 2020-08-26 DIAGNOSIS — S61412A Laceration without foreign body of left hand, initial encounter: Secondary | ICD-10-CM | POA: Diagnosis not present

## 2020-08-26 DIAGNOSIS — Y92002 Bathroom of unspecified non-institutional (private) residence single-family (private) house as the place of occurrence of the external cause: Secondary | ICD-10-CM | POA: Insufficient documentation

## 2020-08-26 DIAGNOSIS — T148XXA Other injury of unspecified body region, initial encounter: Secondary | ICD-10-CM

## 2020-08-26 DIAGNOSIS — E1169 Type 2 diabetes mellitus with other specified complication: Secondary | ICD-10-CM | POA: Diagnosis not present

## 2020-08-26 DIAGNOSIS — Z79899 Other long term (current) drug therapy: Secondary | ICD-10-CM | POA: Diagnosis not present

## 2020-08-26 DIAGNOSIS — S0083XA Contusion of other part of head, initial encounter: Secondary | ICD-10-CM | POA: Diagnosis not present

## 2020-08-26 DIAGNOSIS — N183 Chronic kidney disease, stage 3 unspecified: Secondary | ICD-10-CM | POA: Insufficient documentation

## 2020-08-26 DIAGNOSIS — S81812A Laceration without foreign body, left lower leg, initial encounter: Secondary | ICD-10-CM | POA: Diagnosis not present

## 2020-08-26 DIAGNOSIS — Z87891 Personal history of nicotine dependence: Secondary | ICD-10-CM | POA: Insufficient documentation

## 2020-08-26 DIAGNOSIS — I509 Heart failure, unspecified: Secondary | ICD-10-CM | POA: Insufficient documentation

## 2020-08-26 DIAGNOSIS — W19XXXA Unspecified fall, initial encounter: Secondary | ICD-10-CM

## 2020-08-26 DIAGNOSIS — S0990XA Unspecified injury of head, initial encounter: Secondary | ICD-10-CM | POA: Diagnosis present

## 2020-08-26 IMAGING — CT CT CERVICAL SPINE W/O CM
3 of 4 series · 13 of 33 positions shown, 16 images · non-contrast
Comparison: None.

CLINICAL DATA: Fall

EXAM:
CT HEAD WITHOUT CONTRAST
TECHNIQUE: Contiguous axial images were obtained from the base of the skull
through the vertex without intravenous contrast.

[Series 3: c_spine 2.0 i30s 3 · axial · 0.45mm/px · z∈[+604,+692]mm · 5 of 66 slices shown, 7 images]
[im 11/66  soft-tissue]
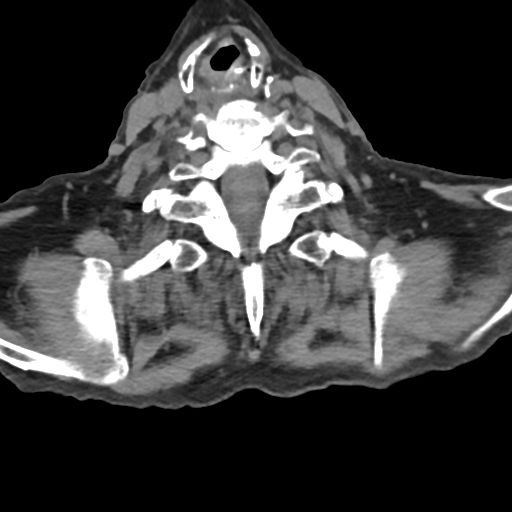
[im 11/66  bone]
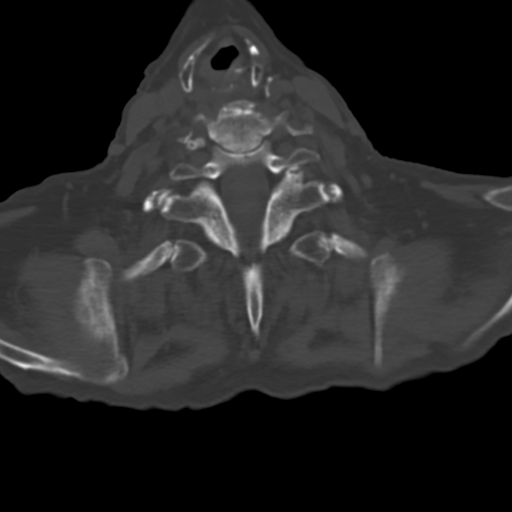
[im 22/66  bone]
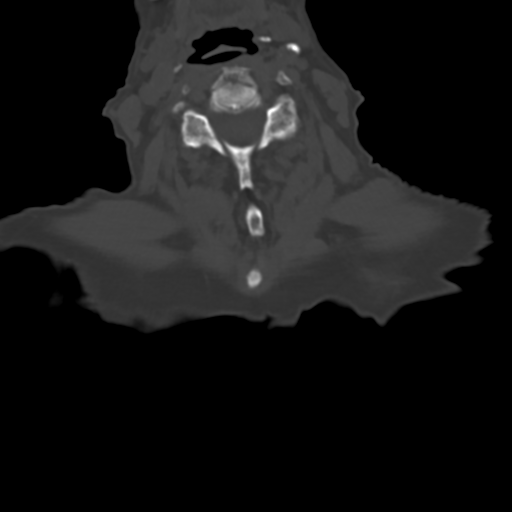
[im 33/66  bone]
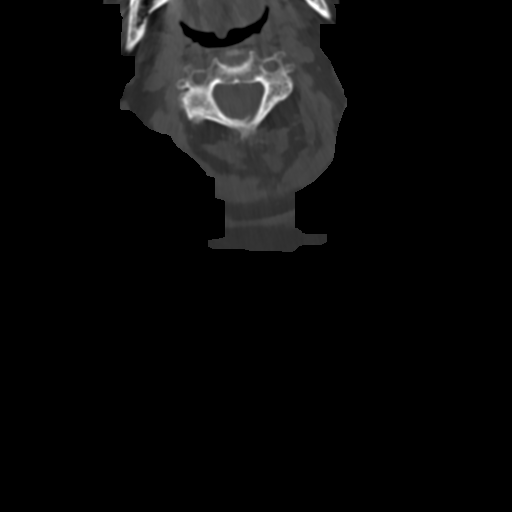
[im 44/66  bone]
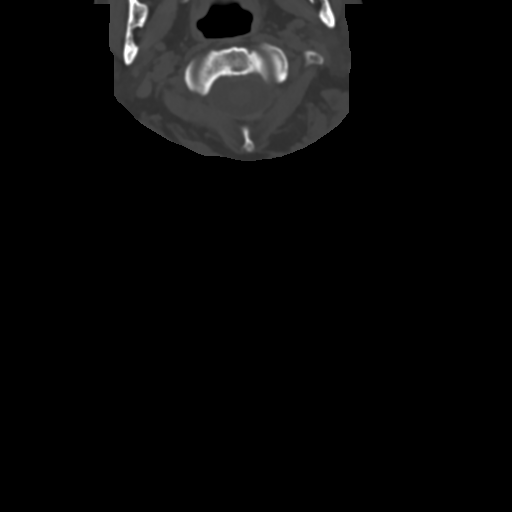
[im 55/66  soft-tissue]
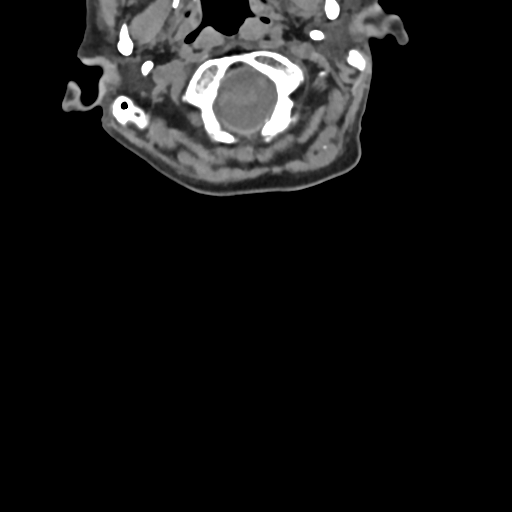
[im 55/66  bone]
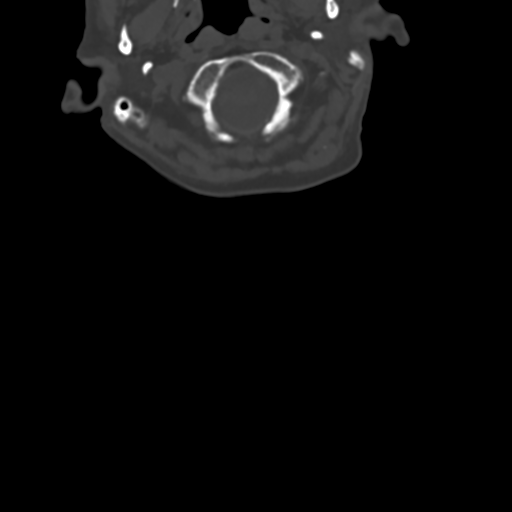

[Series 5: coronals · coronal · 0.23mm/px · 3 of 56 slices shown]
[im 15/56  bone]
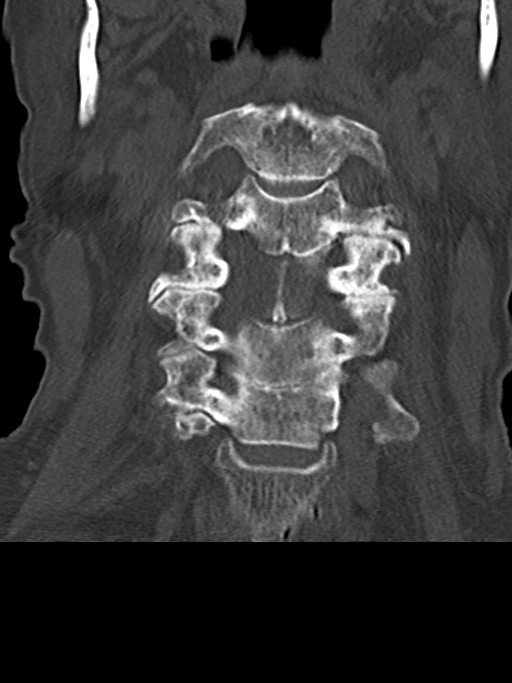
[im 24/56  bone]
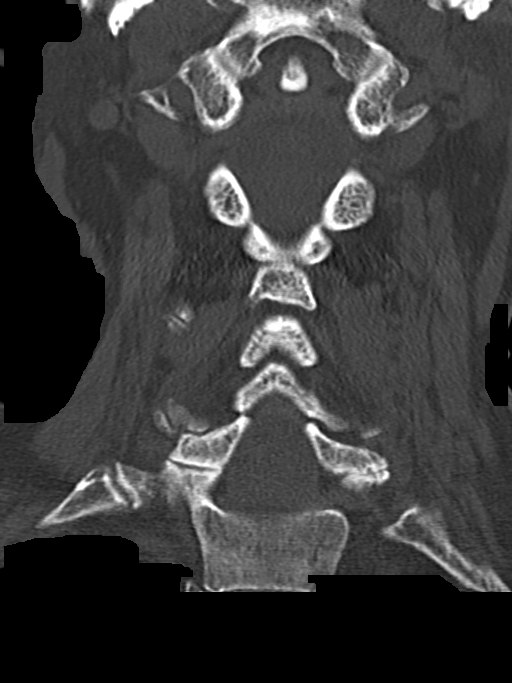
[im 33/56  bone]
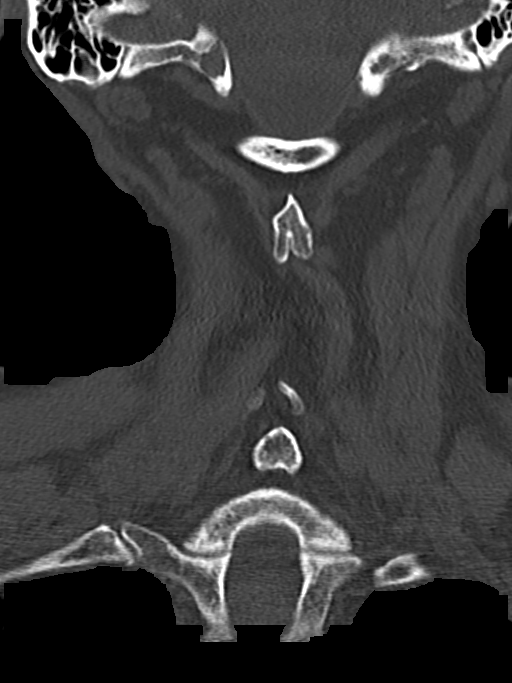

[Series 6: sagittals · sagittal · 0.28mm/px · 5 of 61 slices shown, 6 images]
[im 21/61  bone]
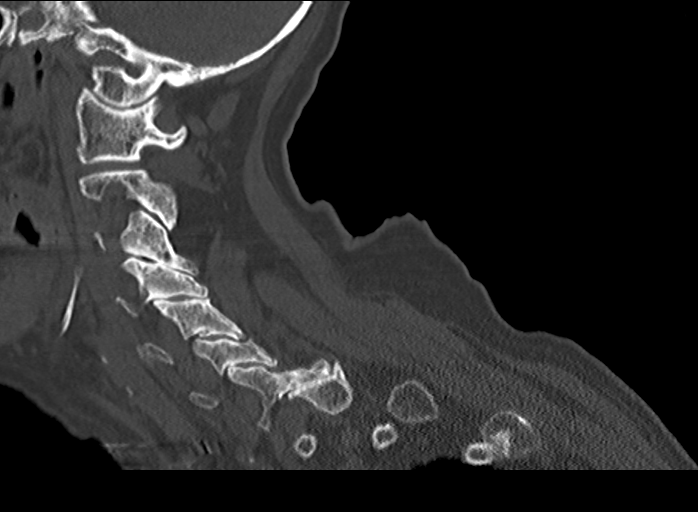
[im 26/61  bone]
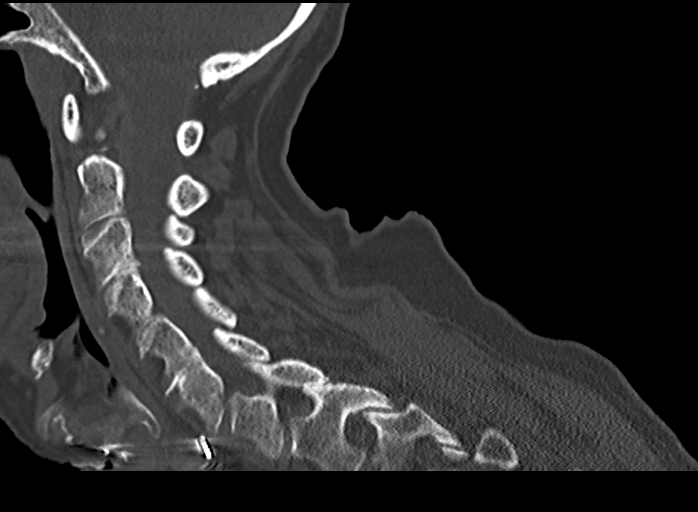
[im 31/61  soft-tissue]
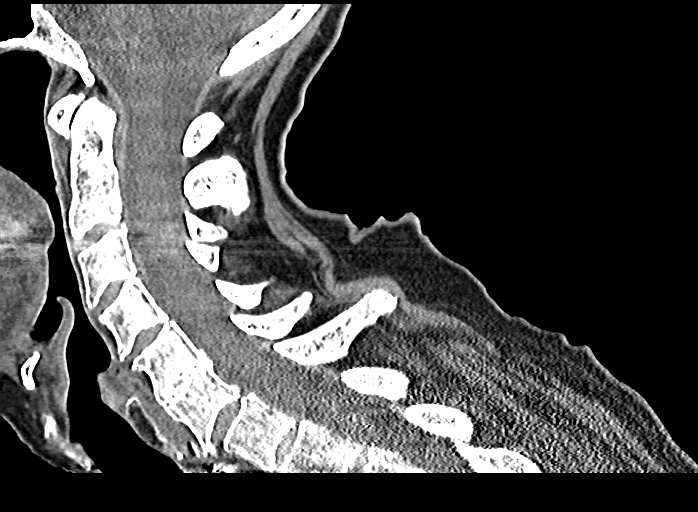
[im 31/61  bone]
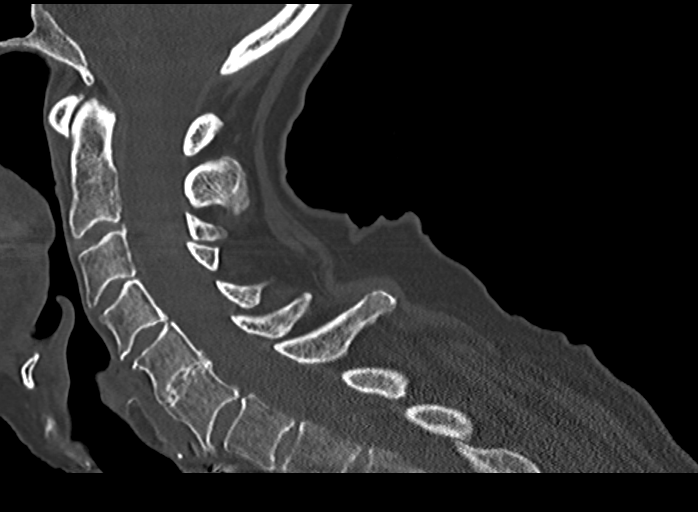
[im 36/61  bone]
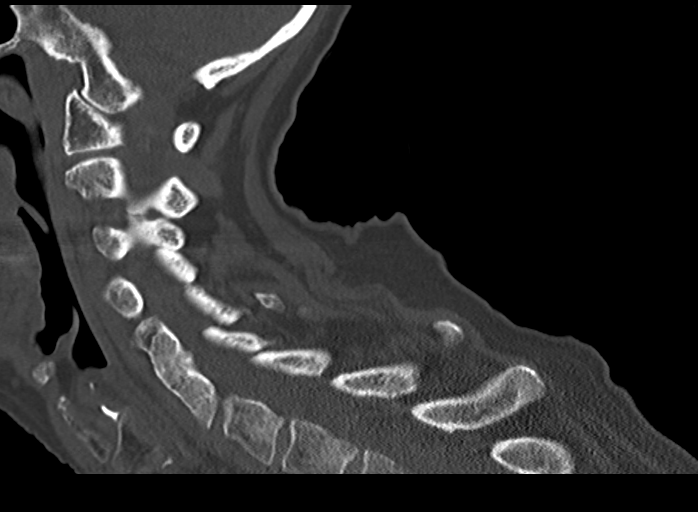
[im 41/61  bone]
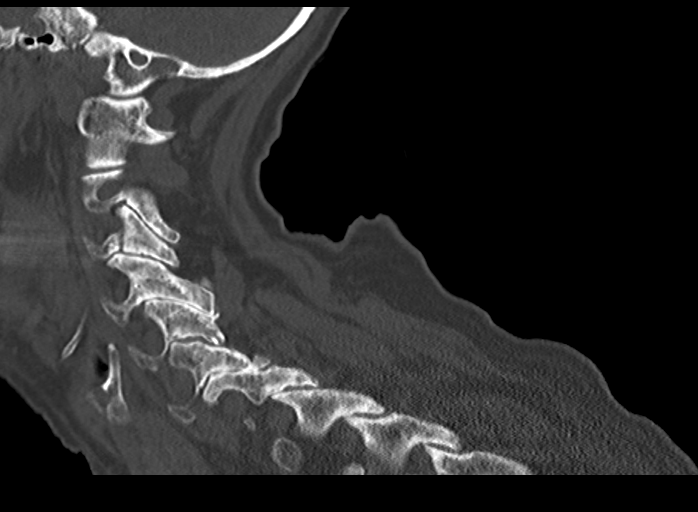

[13 of 33 positions shown; findings below may reference images not displayed]

FINDINGS: Brain: No evidence of acute territorial infarction, hemorrhage,
hydrocephalus,extra-axial collection or mass lesion/mass effect.
There is dilatation the ventricles and sulci consistent with
age-related atrophy. Low-attenuation changes in the deep white
matter consistent with small vessel ischemia.

Vascular: No hyperdense vessel or unexpected calcification.

Skull: The skull is intact. No fracture or focal lesion identified.

Sinuses/Orbits: The visualized paranasal sinuses and mastoid air
cells are clear. The orbits and globes intact.

Other: Mild soft tissue swelling with a small hematoma overlying the
left frontal.

Cervical spine:

Alignment: Physiologic

Skull base and vertebrae: Visualized skull base is intact. No
atlanto-occipital dissociation. The vertebral body heights are well
maintained. No fracture or pathologic osseous lesion seen.

Soft tissues and spinal canal: The visualized paraspinal soft
tissues are unremarkable. No prevertebral soft tissue swelling is
seen. The spinal canal is grossly unremarkable, no large epidural
collection or significant canal narrowing.

Disc levels: Cervical spine spondylosis is seen with disc height
loss and uncovertebral osteophytes most notable at C5-C6 with
moderate to severe neural foraminal narrowing.

Upper chest: The lung apices are clear. Thoracic inlet is within
normal limits.

Other: None
IMPRESSION: No acute intracranial abnormality.

Findings consistent with age related atrophy and chronic small
vessel ischemia

No acute fracture or malalignment of the spine.

## 2020-08-26 IMAGING — DX DG ELBOW COMPLETE 3+V*L*
4 series · 4 of 4 positions shown · non-contrast
Comparison: None.

CLINICAL DATA: Fall

EXAM:
LEFT ELBOW - COMPLETE 3+ VIEW

[elbow ap]
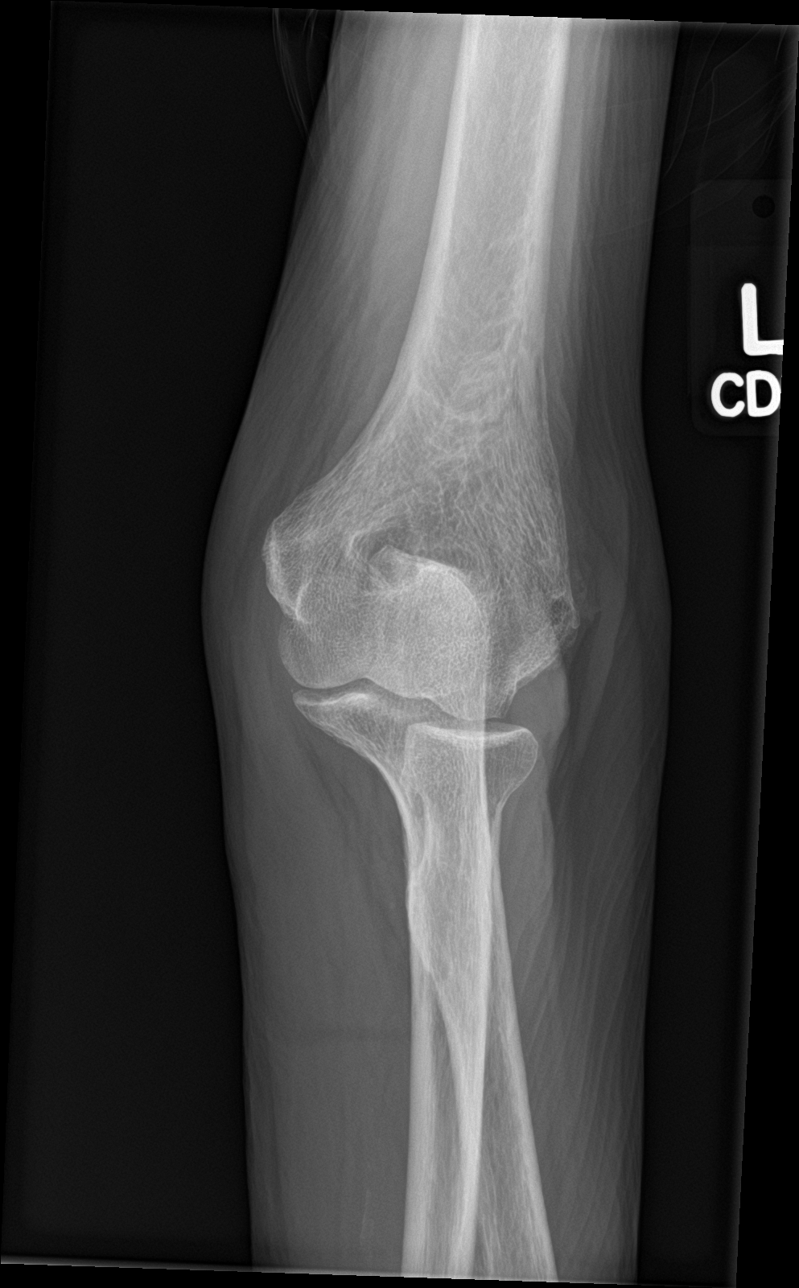

[elbow obl (1 of 2)]
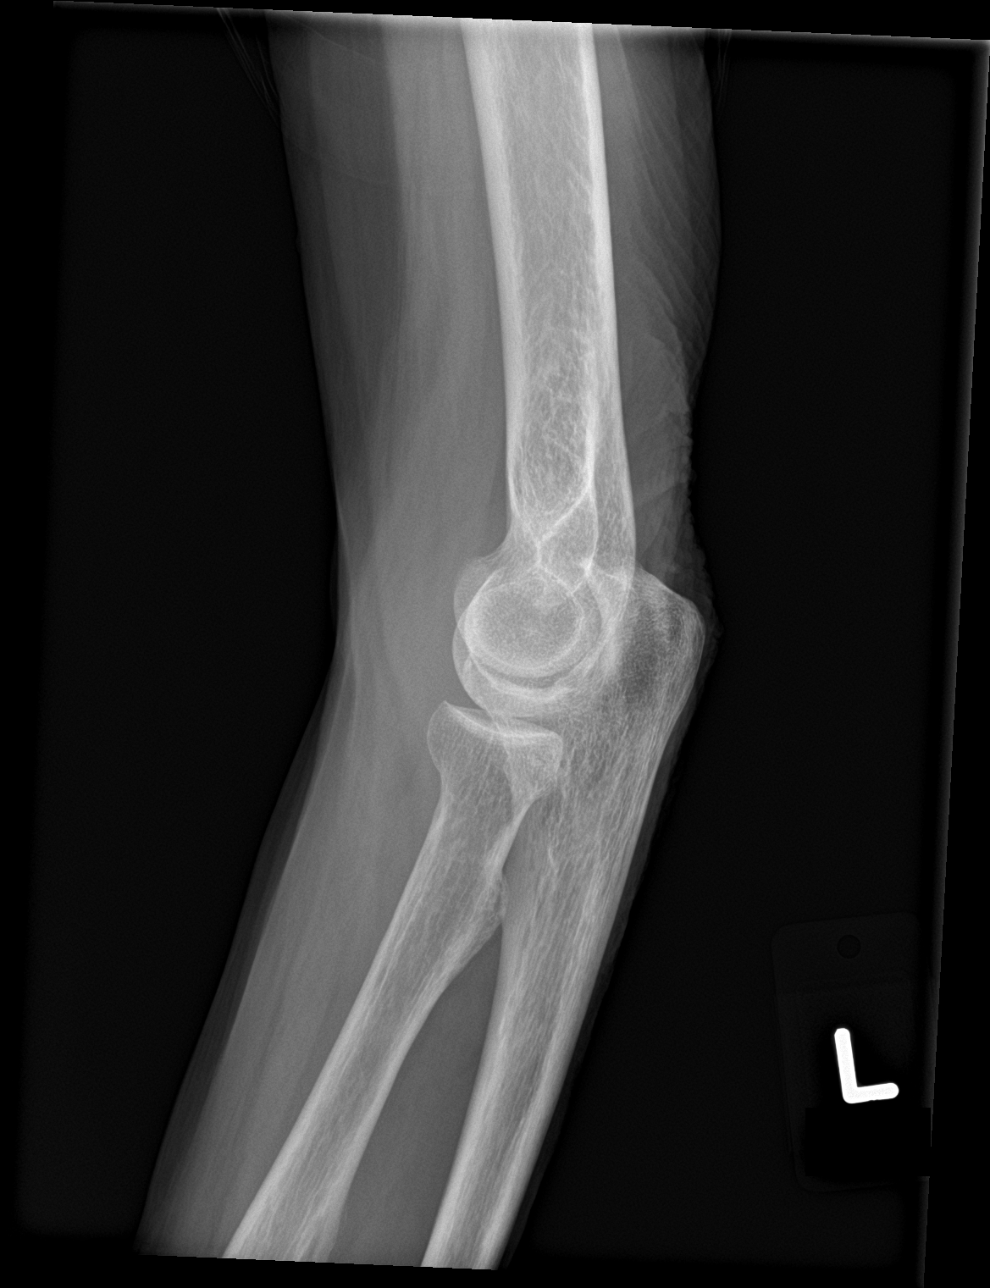

[elbow obl (2 of 2)]
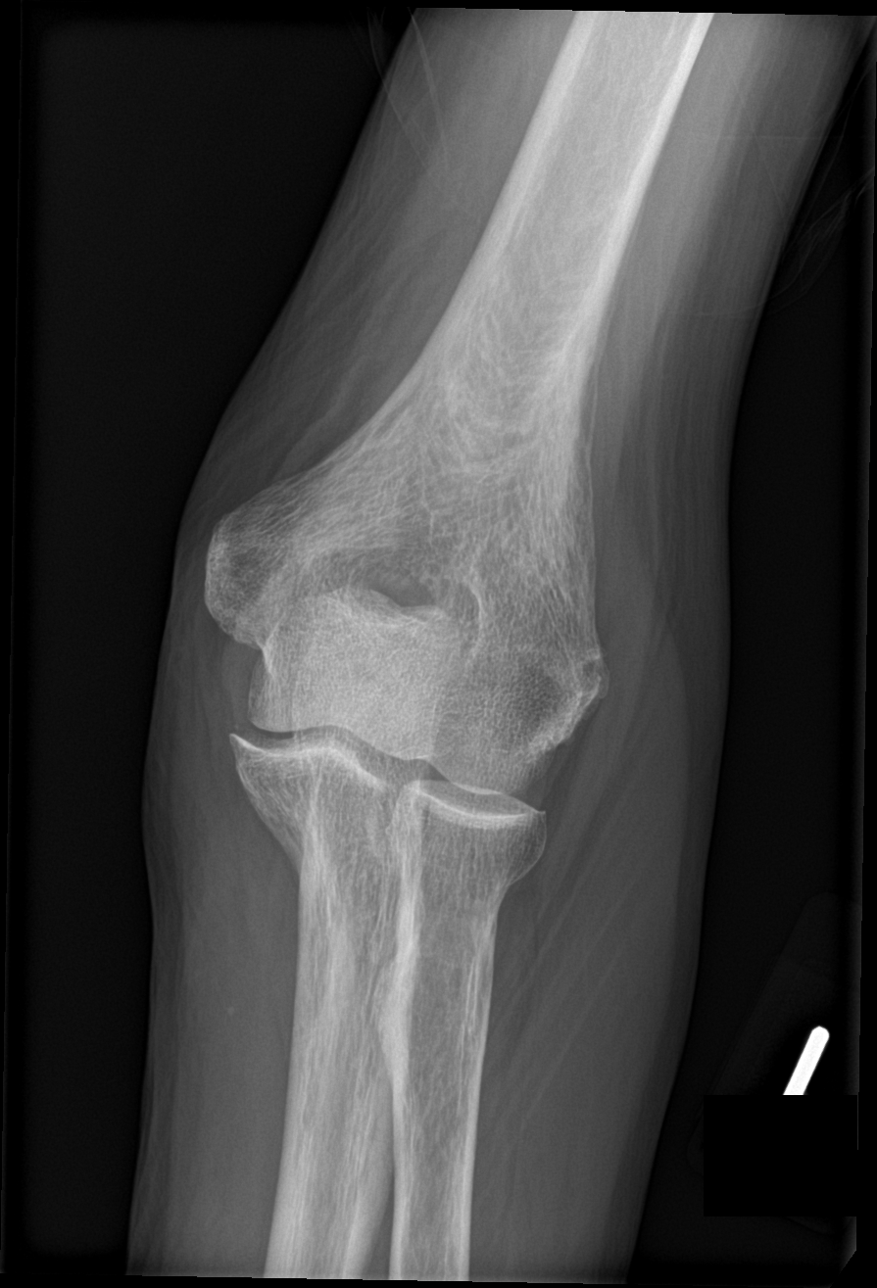

[elbow lat]
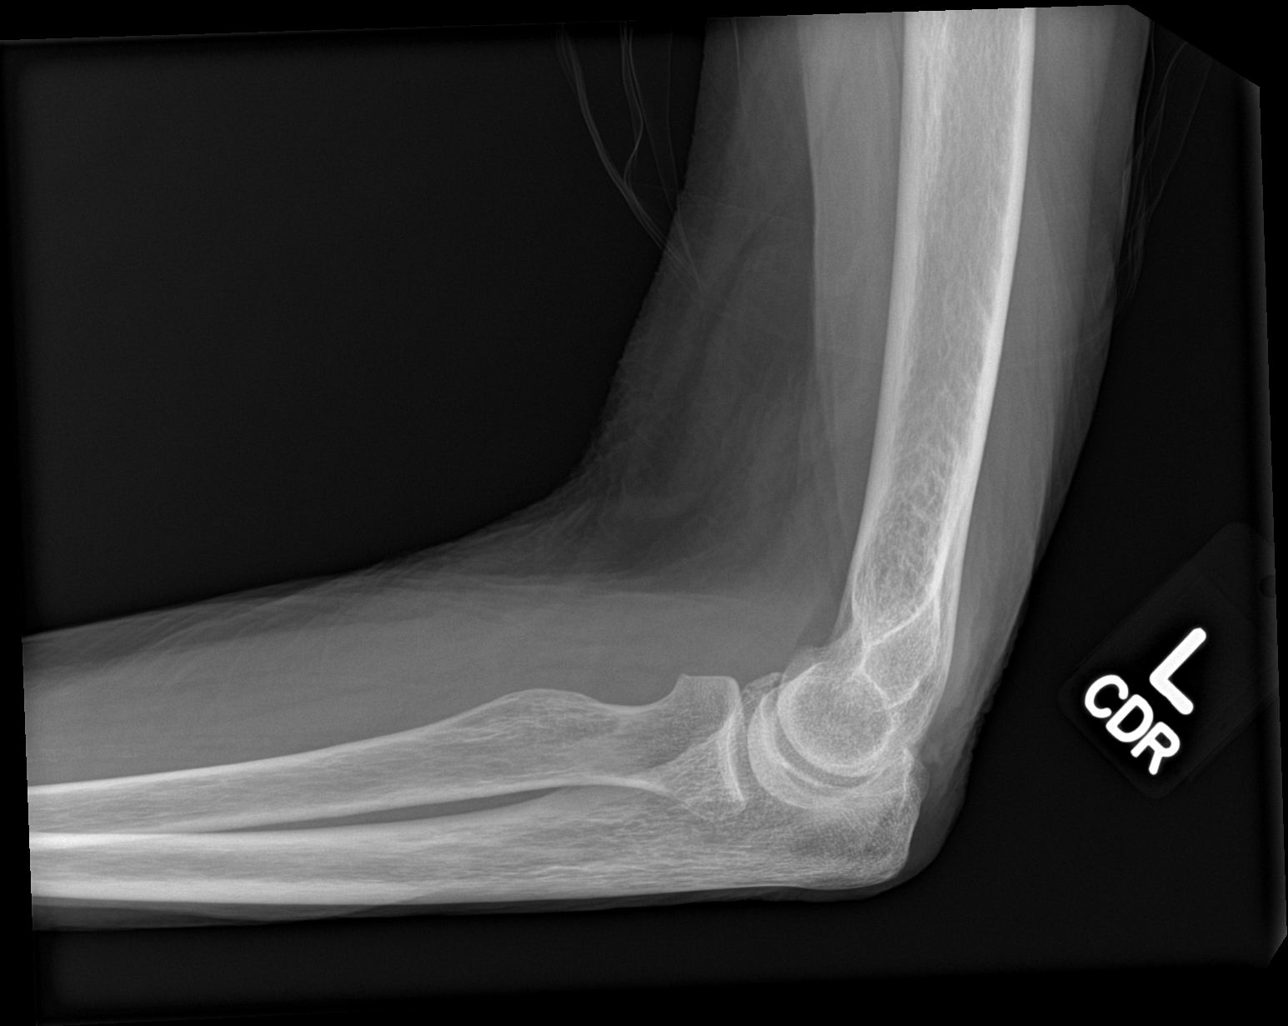

[4 of 4 positions shown; findings below may reference images not displayed]

FINDINGS: There is no evidence of fracture, dislocation, or joint effusion.
There is no evidence of arthropathy or other focal bone abnormality.
Soft tissues are unremarkable.
IMPRESSION: Negative.

## 2020-08-26 IMAGING — DX DG HAND COMPLETE 3+V*L*
3 series · 3 of 3 positions shown · non-contrast
Comparison: None.

CLINICAL DATA: Fall

EXAM:
LEFT HAND - COMPLETE 3+ VIEW

[hand pa]
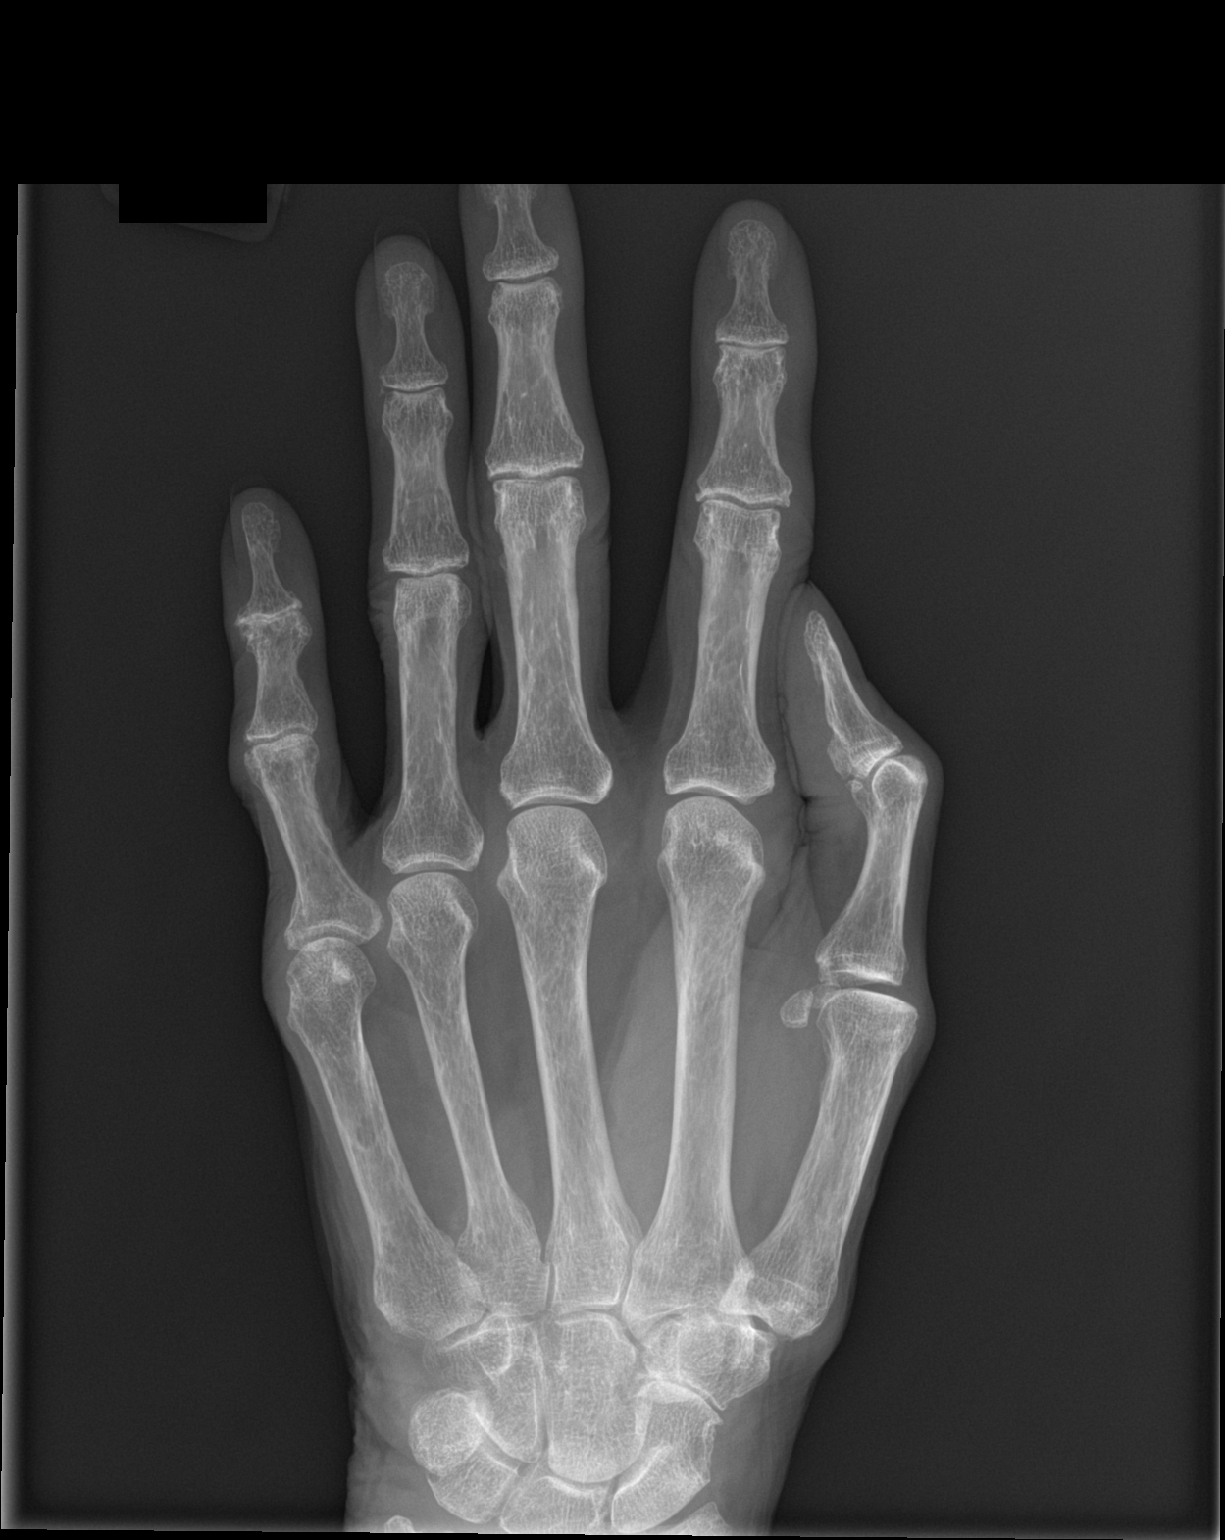

[hand obl]
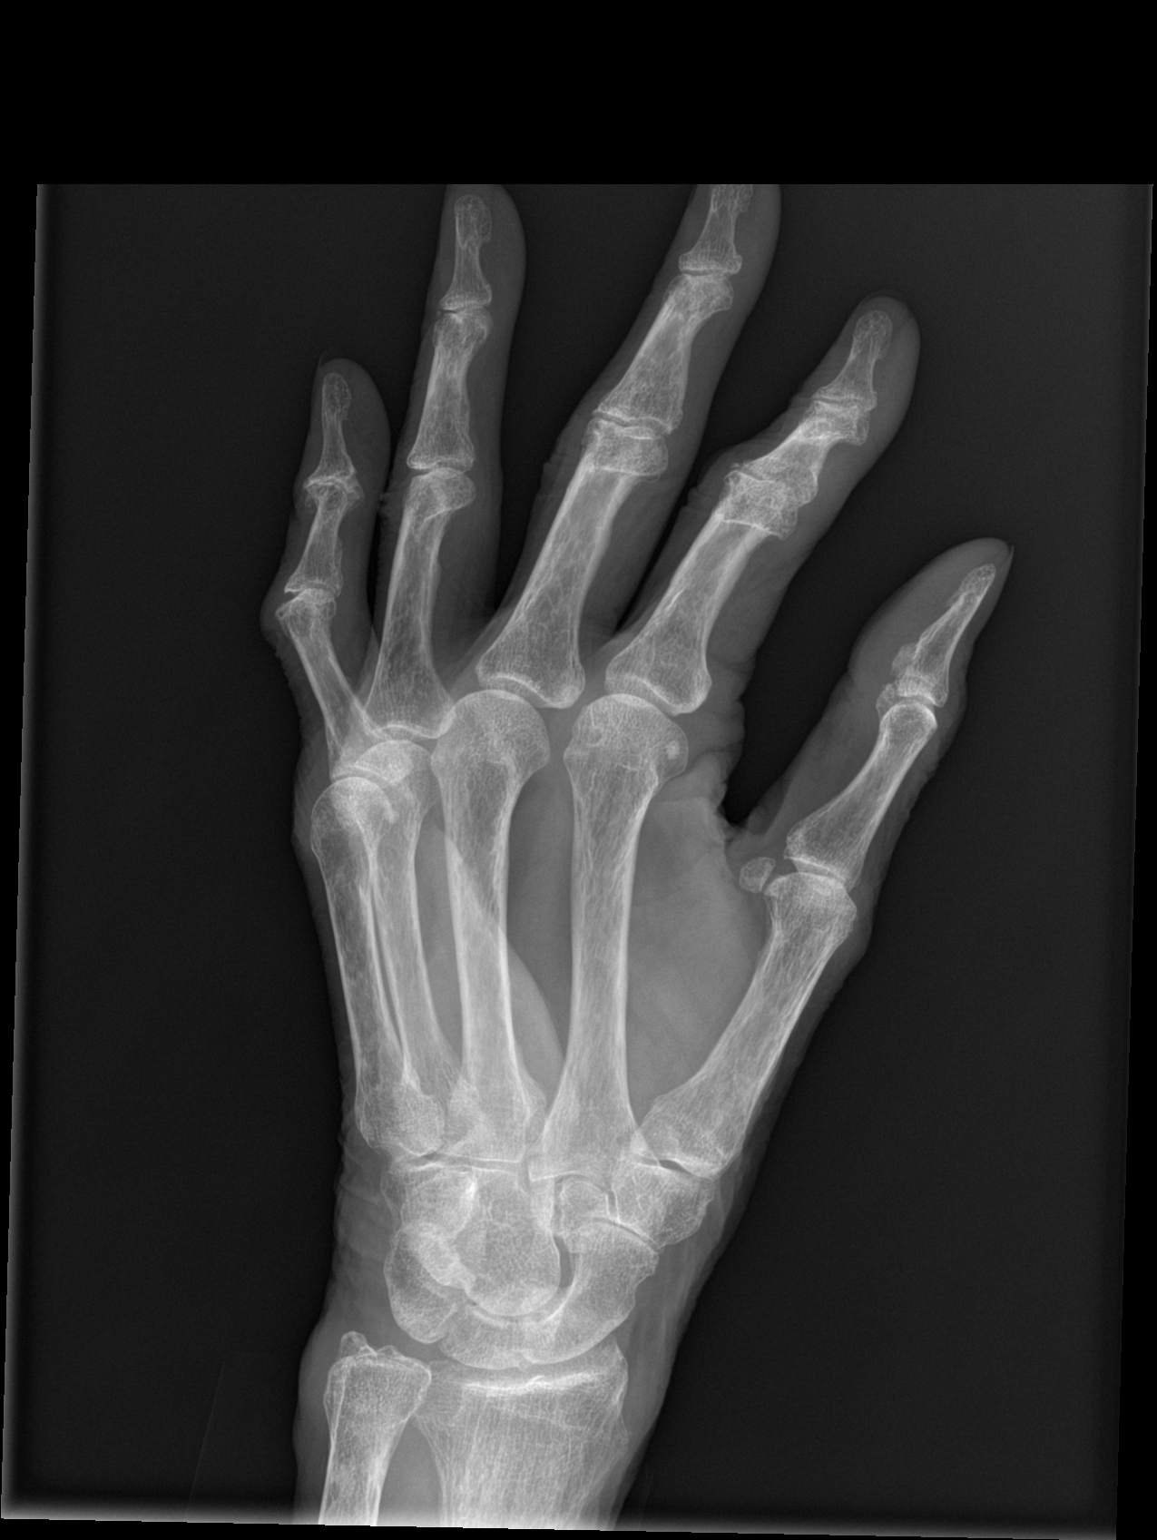

[hand lat]
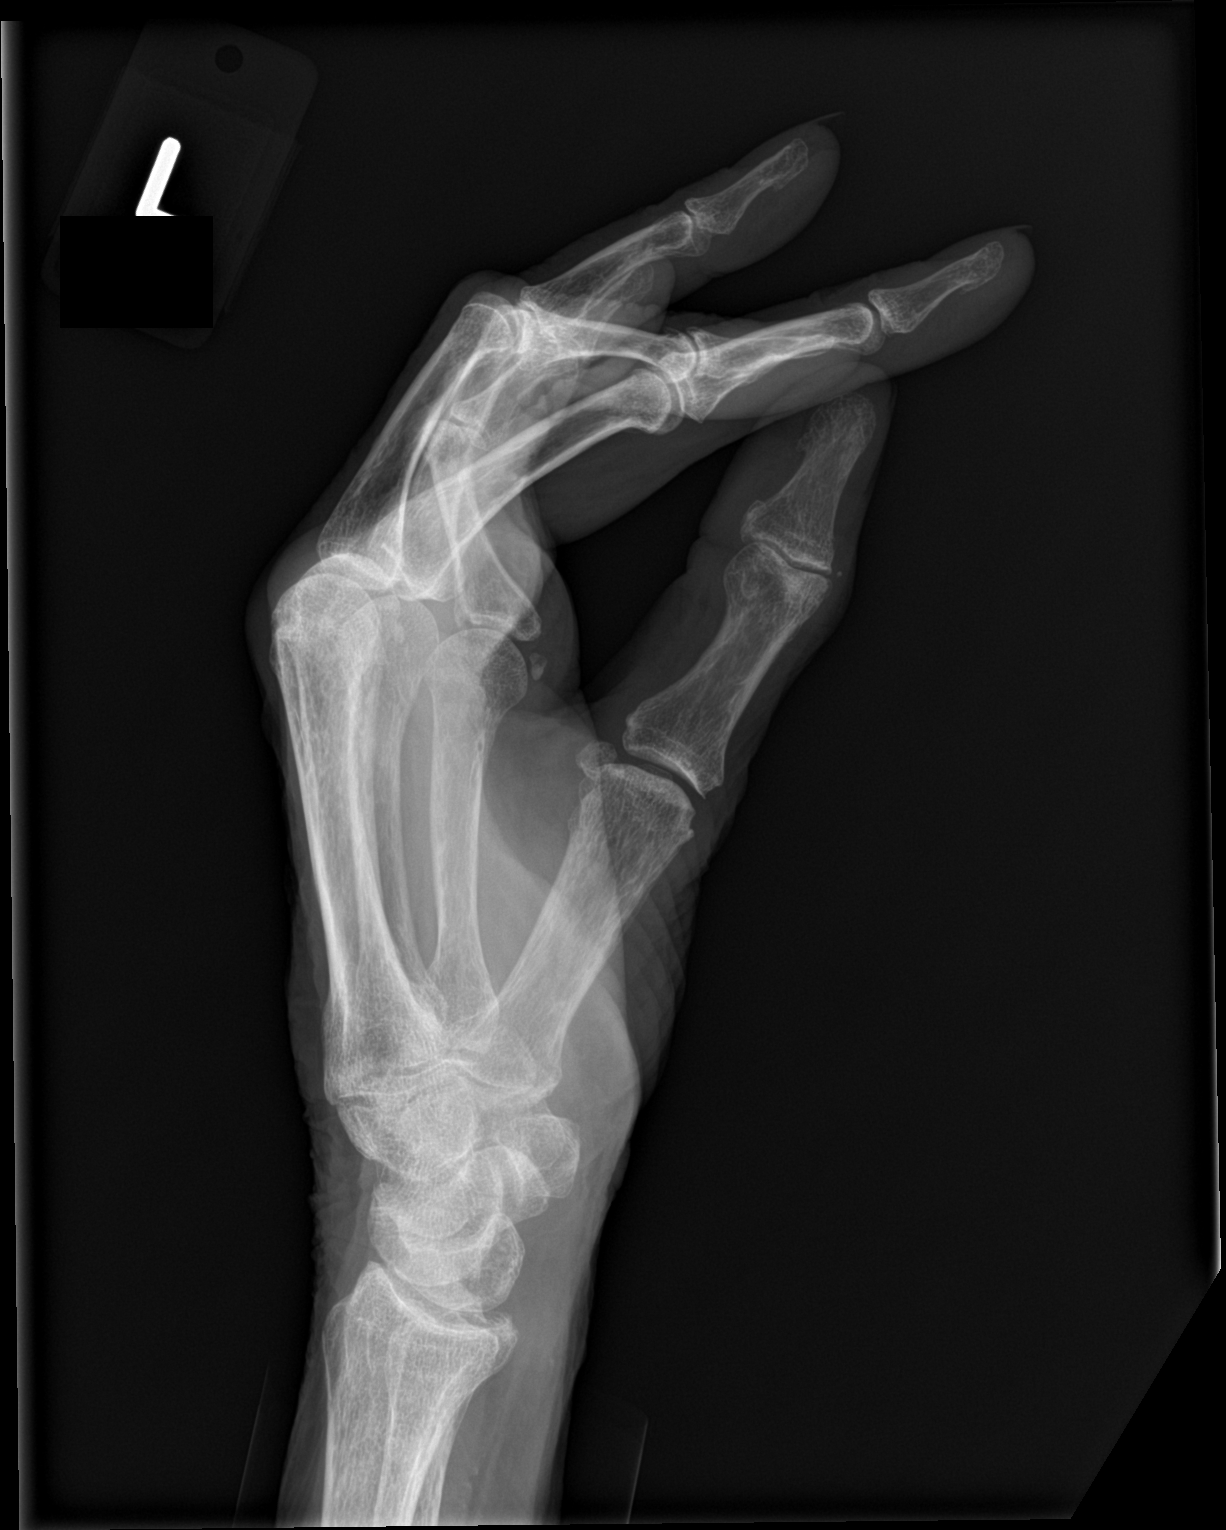

[3 of 3 positions shown; findings below may reference images not displayed]

FINDINGS: There is no evidence of fracture or dislocation. There is no
evidence of arthropathy or other focal bone abnormality. Soft
tissues are unremarkable.
IMPRESSION: Negative.

## 2020-08-26 IMAGING — CT CT HEAD W/O CM
3 series · 14 of 47 positions shown, 16 images · non-contrast
Comparison: None.

CLINICAL DATA: Fall

EXAM:
CT HEAD WITHOUT CONTRAST
TECHNIQUE: Contiguous axial images were obtained from the base of the skull
through the vertex without intravenous contrast.

[Series 2: head 5.0 h30s · axial · 0.44mm/px · z∈[+700,+824]mm · 8 of 31 slices shown, 10 images]
[im 3/31  brain]
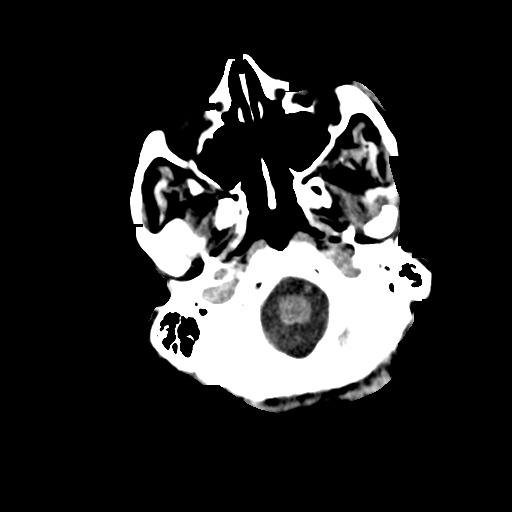
[im 3/31  bone]
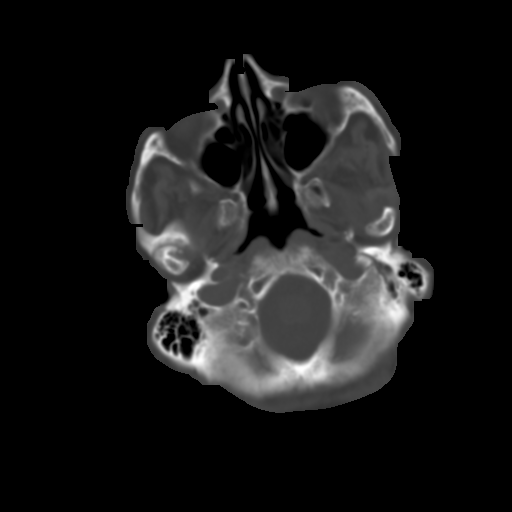
[im 7/31  brain]
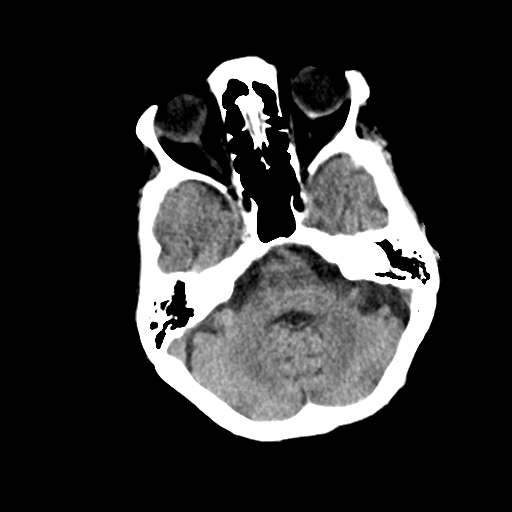
[im 10/31  brain]
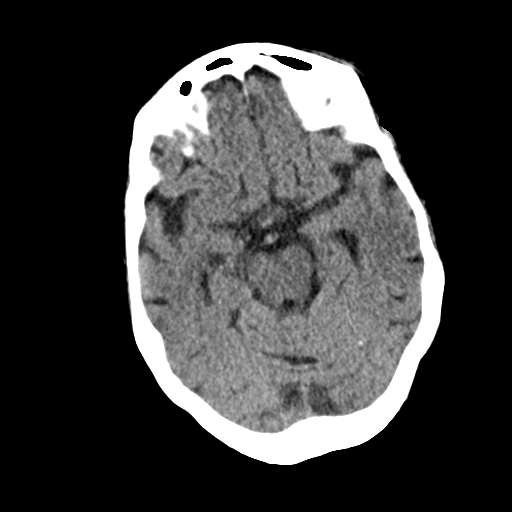
[im 14/31  brain]
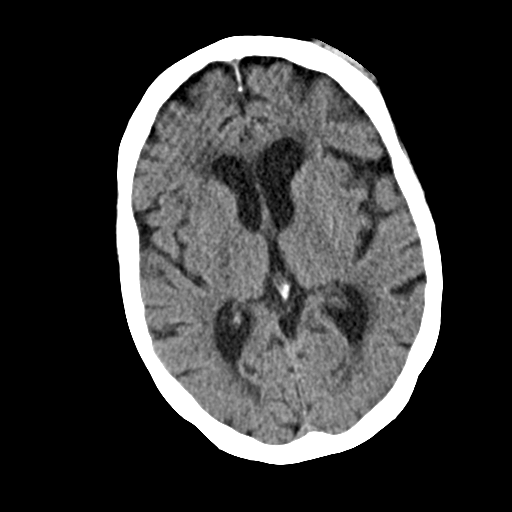
[im 17/31  brain]
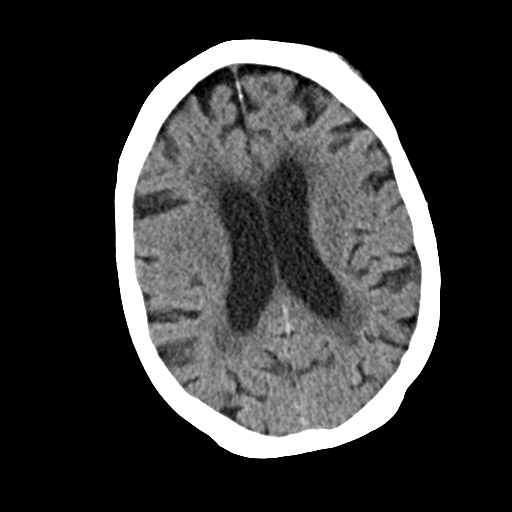
[im 17/31  bone]
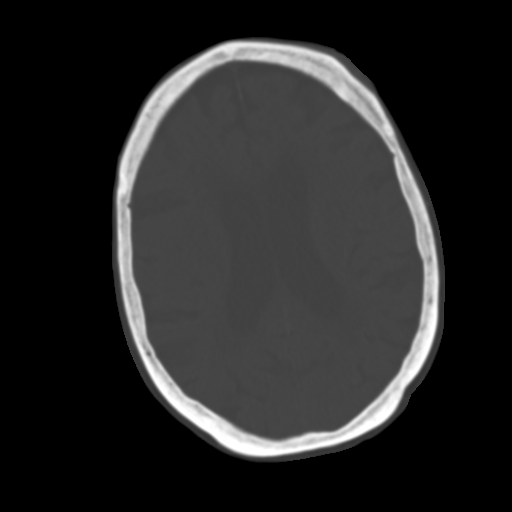
[im 21/31  brain]
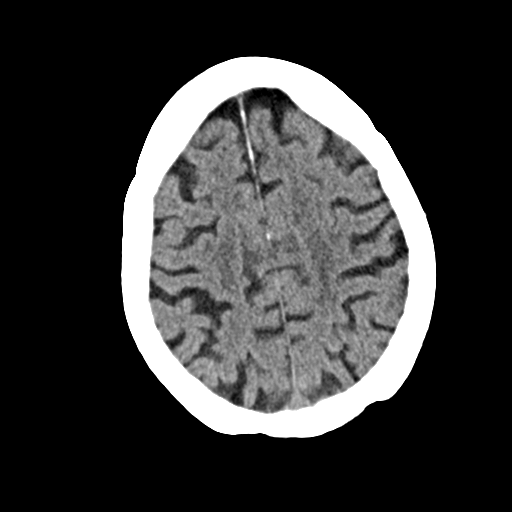
[im 24/31  brain]
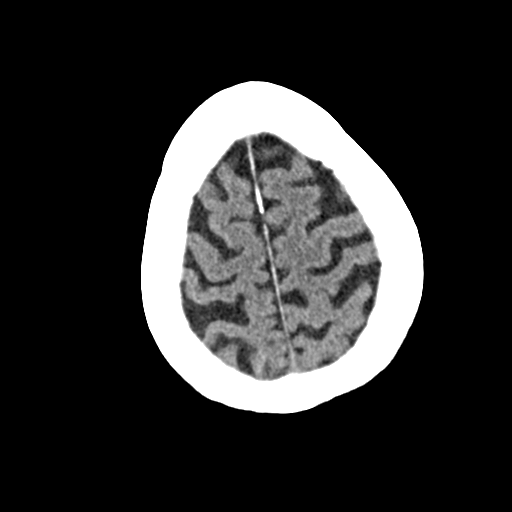
[im 28/31  brain]
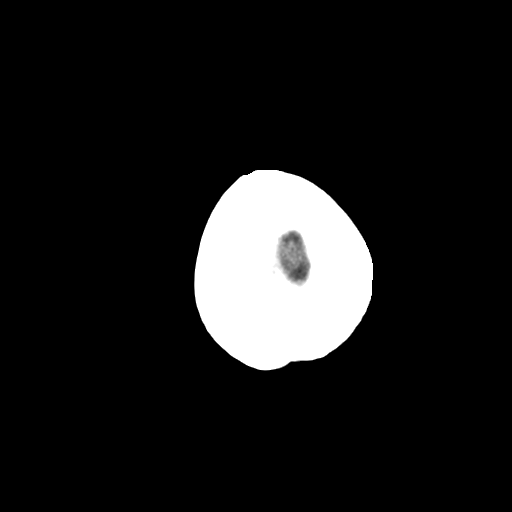

[Series 4: head 3.0 mpr cor · coronal · 0.31mm/px · 3 of 68 slices shown]
[im 23/68  brain]
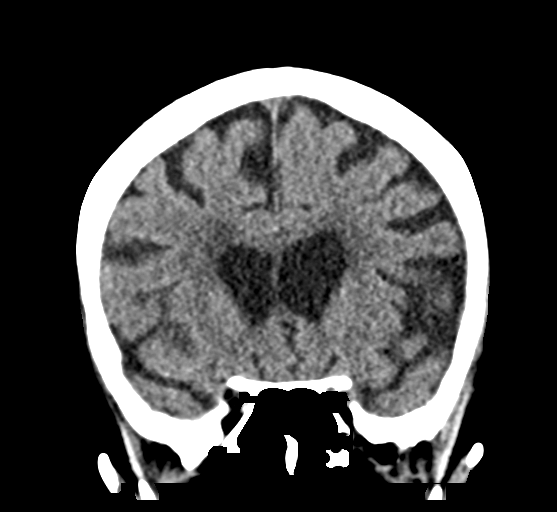
[im 30/68  brain]
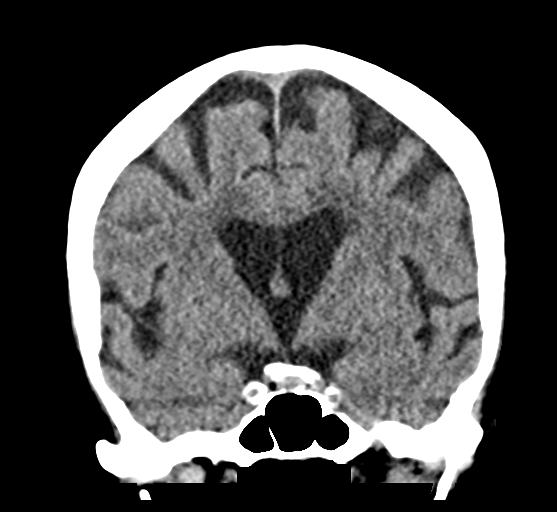
[im 38/68  brain]
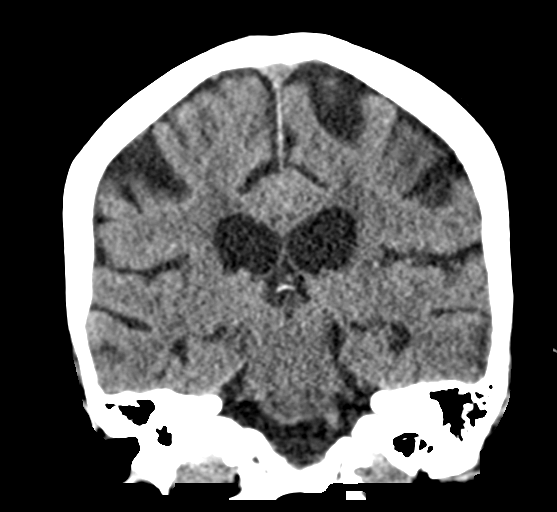

[Series 5: head 3.0 mpr sag · sagittal · 0.30mm/px · 3 of 53 slices shown]
[im 18/53  brain]
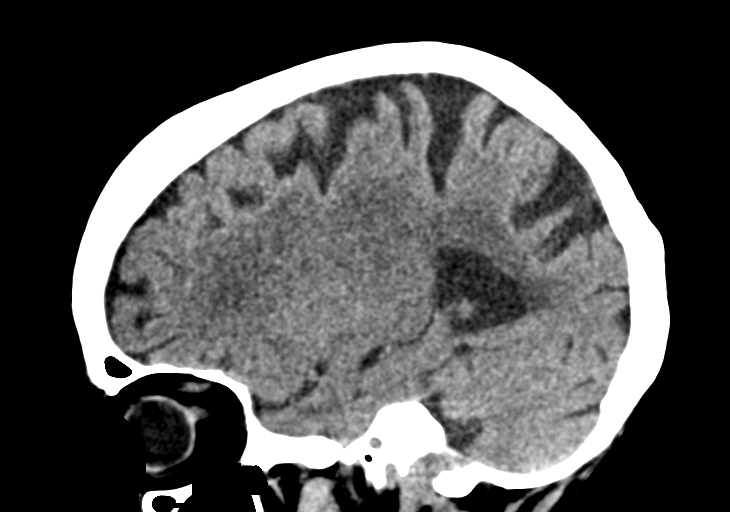
[im 27/53  brain]
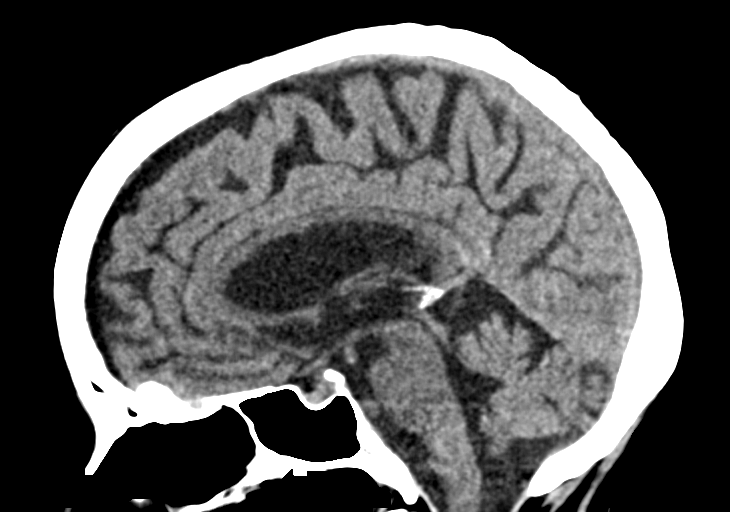
[im 35/53  brain]
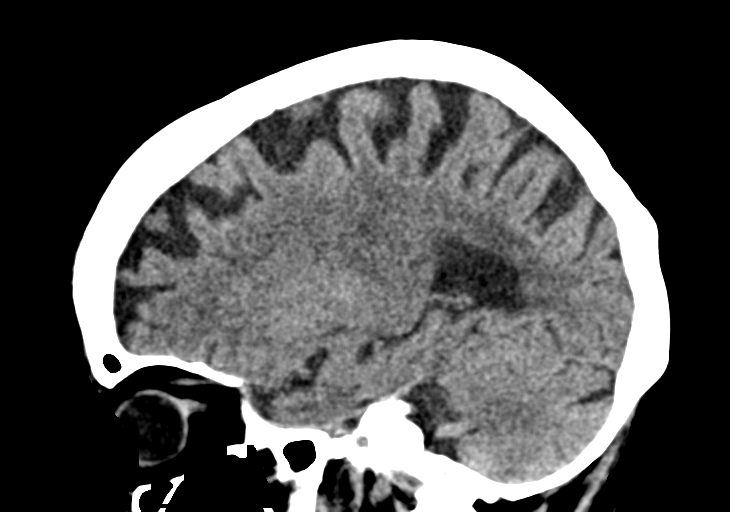

[14 of 47 positions shown; findings below may reference images not displayed]

FINDINGS: Brain: No evidence of acute territorial infarction, hemorrhage,
hydrocephalus,extra-axial collection or mass lesion/mass effect.
There is dilatation the ventricles and sulci consistent with
age-related atrophy. Low-attenuation changes in the deep white
matter consistent with small vessel ischemia.

Vascular: No hyperdense vessel or unexpected calcification.

Skull: The skull is intact. No fracture or focal lesion identified.

Sinuses/Orbits: The visualized paranasal sinuses and mastoid air
cells are clear. The orbits and globes intact.

Other: Mild soft tissue swelling with a small hematoma overlying the
left frontal.

Cervical spine:

Alignment: Physiologic

Skull base and vertebrae: Visualized skull base is intact. No
atlanto-occipital dissociation. The vertebral body heights are well
maintained. No fracture or pathologic osseous lesion seen.

Soft tissues and spinal canal: The visualized paraspinal soft
tissues are unremarkable. No prevertebral soft tissue swelling is
seen. The spinal canal is grossly unremarkable, no large epidural
collection or significant canal narrowing.

Disc levels: Cervical spine spondylosis is seen with disc height
loss and uncovertebral osteophytes most notable at C5-C6 with
moderate to severe neural foraminal narrowing.

Upper chest: The lung apices are clear. Thoracic inlet is within
normal limits.

Other: None
IMPRESSION: No acute intracranial abnormality.

Findings consistent with age related atrophy and chronic small
vessel ischemia

No acute fracture or malalignment of the spine.

## 2020-08-26 IMAGING — DX DG FOREARM 2V*R*
2 series · 2 of 2 positions shown · non-contrast
Comparison: None.

CLINICAL DATA: Fall

EXAM:
RIGHT FOREARM - 2 VIEW

[forearm ap]
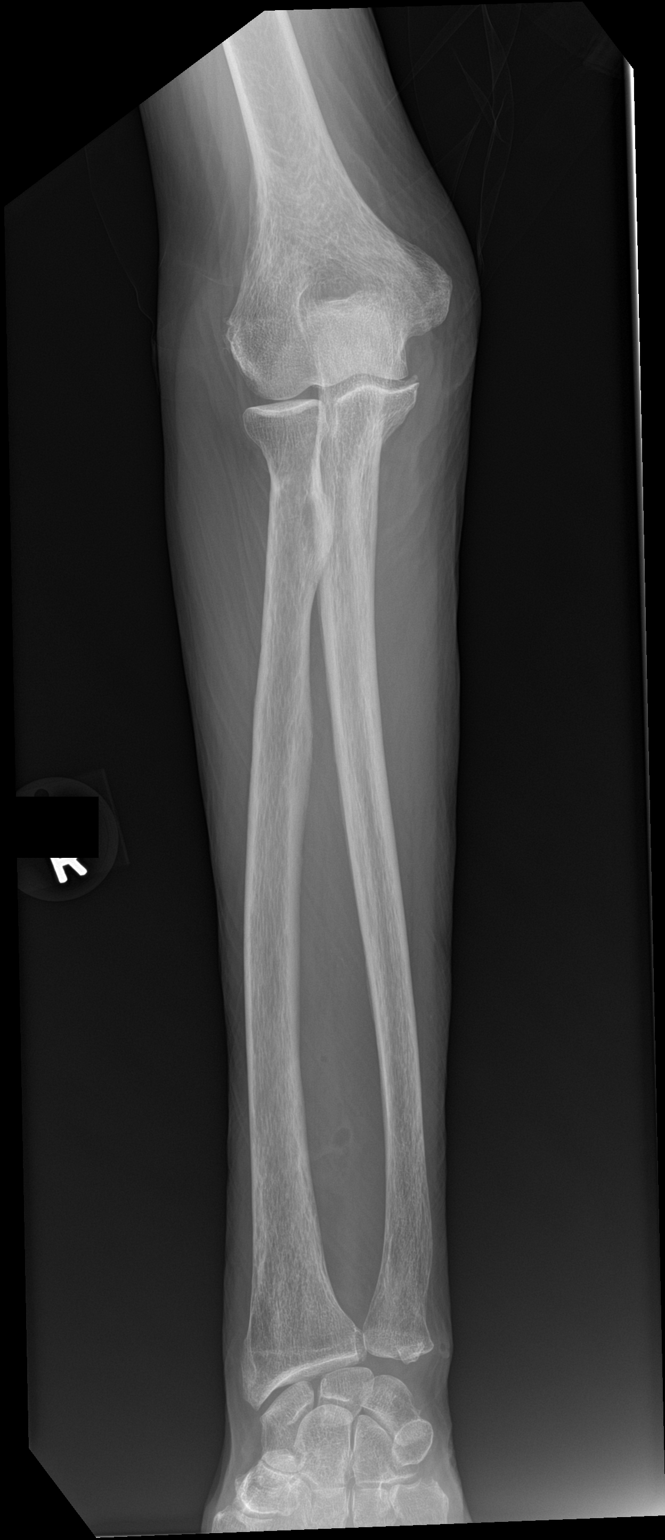

[forearm lat]
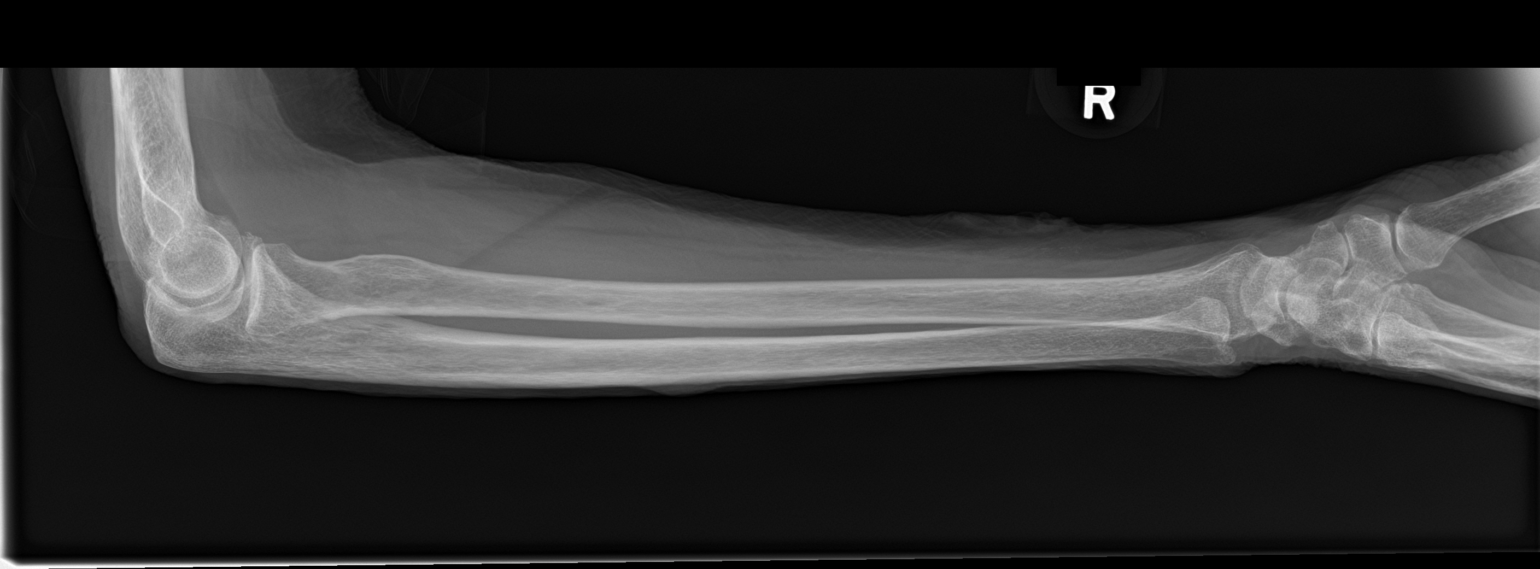

[2 of 2 positions shown; findings below may reference images not displayed]

FINDINGS: There is no evidence of fracture or other focal bone lesions. Soft
tissues are unremarkable.
IMPRESSION: Negative.

## 2020-08-26 NOTE — ED Triage Notes (Signed)
Brought by daughter after patient attempted to get up and fell.  Hematoma and skin tear noted to left temple as well as skin tears to left outer knee, right fa, left hand, and left elbow.  Patient is on plavix qod.  Hx of dementia.

## 2020-08-26 NOTE — ED Notes (Signed)
EDP at bedside  

## 2020-08-26 NOTE — ED Provider Notes (Signed)
[facility] EMERGENCY DEPARTMENT Provider Note   CSN: [id number] Arrival date & time: 08/26/20  0153     History Chief Complaint  Patient presents with  . Fall    [name] is a 86 y.o. female.  HPI     This is an 86 year old female with a history of diabetes, coronary artery disease, hypertension who presents following a fall.  Patient reports that she slipped and fell in the bathroom.  She hit her head and was noted to have multiple skin tears.  She was able to get up with assistance from her daughter.  She takes Plavix every other day.  She denies any significant pain.  Denies headache.  Has not had any nausea or vomiting.  Last tetanus shot was within the last year.  Denies neck pain, chest pain, shortness of breath, abdominal pain.  Past Medical History:  Diagnosis Date  . Coronary artery disease   . DM (diabetes mellitus) ([place])   . HTN (hypertension)   . Hyperlipidemia   . Myocardial infarct ([place]) 2012  . Renal disorder     Patient Active Problem List   Diagnosis Date Noted  . CHF (congestive heart failure) ([place]) 08/11/2020  . CKD (chronic kidney disease), stage III ([place]) 08/11/2020  . Essential hypertension 08/11/2020  . HLD (hyperlipidemia) 08/11/2020  . Type 2 diabetes mellitus without complication ([place]) 05/04/4817  . Memory impairment 08/11/2020  . CAD (coronary artery disease) 08/11/2020  . History of heart artery stent 08/11/2020  . Pleural effusion 03/19/2020    Past Surgical History:  Procedure Laterality Date  . CARDIAC CATHETERIZATION    . CAROTID ENDARTERECTOMY    . CORONARY ANGIOPLASTY    . CORONARY STENT PLACEMENT    . IR THORACENTESIS ASP PLEURAL SPACE W/IMG GUIDE  08/13/2020     OB History   No obstetric history on file.     Family History  Problem Relation Age of Onset  . Alzheimer's disease Mother   . Heart attack Father   . Heart disease Father   . Lymphoma Sister   . Heart failure Brother     Social History    Tobacco Use  . Smoking status: Former Smoker    Packs/day: 1.00    Years: 2.00    Pack years: 2.00    Types: Cigarettes    Quit date: 11/15/1969    Years since quitting: 50.8  . Smokeless tobacco: Never Used  Substance Use Topics  . Alcohol use: Never  . Drug use: Never    Home Medications Prior to Admission medications   Medication Sig Start Date End Date Taking? Authorizing Provider  Biotin 5000 MCG TABS Take 5,000 mcg by mouth daily.    [provider]  Cholecalciferol (VITAMIN D3 SUPER STRENGTH) 50 MCG (2000 UT) TABS Take 2,000 Units by mouth daily.    [provider]  clopidogrel (PLAVIX) 75 MG tablet Take 75 mg by mouth every Monday, Wednesday, and Friday.     [provider]  donepezil (ARICEPT) 10 MG tablet Take 1 tablet (10 mg total) by mouth at bedtime. 07/22/20   Star Age, MD  furosemide (LASIX) 20 MG tablet Take 1 tablet (20 mg total) by mouth daily for 21 days. Discuss with the cardiology for further resumption of diuretics 08/14/20 09/04/20  Antonieta Pert, MD  levothyroxine (SYNTHROID) 25 MCG tablet Take 25 mcg by mouth daily before breakfast.    [provider]  losartan (COZAAR) 100 MG tablet Take 50 mg by  mouth at bedtime.     [provider]  rosuvastatin (CRESTOR) 40 MG tablet Take 40 mg by mouth at bedtime.     [provider]  sertraline (ZOLOFT) 100 MG tablet Take 100 mg by mouth at bedtime.    [provider]    Allergies    Tape, Lisinopril, and Nitrofuran derivatives  Review of Systems   Review of Systems  Respiratory: Negative for shortness of breath.   Cardiovascular: Negative for chest pain.  Gastrointestinal: Negative for abdominal pain, nausea and vomiting.  Musculoskeletal: Negative for neck pain.  Skin: Positive for wound.  Neurological: Negative for syncope and headaches.  All other systems reviewed and are negative.   Physical Exam Updated Vital Signs BP (!) 169/71   Pulse  83   Temp 97.9 F (36.6 C) (Oral)   Resp 16   Ht 1.702 m (5\' 7" )   Wt 49.8 kg   SpO2 100%   BMI 17.20 kg/m   Physical Exam Vitals and nursing note reviewed.  Constitutional:      Appearance: She is well-developed. She is not ill-appearing.  HENT:     Head: Normocephalic.     Comments: Hematoma of the left side of the forehead with associated skin tear, no active bleeding noted, small skin tear just inferior and lateral to the left orbit, midface stable    Right Ear: Tympanic membrane normal.     Left Ear: Tympanic membrane normal.     Nose: Nose normal.     Mouth/Throat:     Mouth: Mucous membranes are moist.  Eyes:     Extraocular Movements: Extraocular movements intact.     Pupils: Pupils are equal, round, and reactive to light.  Neck:     Comments: No midline tenderness to palpation, step-off, deformity Cardiovascular:     Rate and Rhythm: Normal rate and regular rhythm.     Heart sounds: Normal heart sounds.  Pulmonary:     Effort: Pulmonary effort is normal. No respiratory distress.     Breath sounds: No wheezing.  Abdominal:     General: Bowel sounds are normal.     Palpations: Abdomen is soft.  Musculoskeletal:        General: No tenderness or deformity. Normal range of motion.     Cervical back: Neck supple.     Comments: Normal range of motion bilateral hips and knees  Skin:    General: Skin is warm and dry.     Comments: Skin tear left hand, left elbow, right forearm, left lower extremity  Neurological:     Mental Status: She is alert and oriented to person, place, and time.  Psychiatric:        Mood and Affect: Mood normal.     ED Results / Procedures / Treatments   Labs (all labs ordered are listed, but only abnormal results are displayed) Labs Reviewed - No data to display  EKG None  Radiology DG Elbow Complete Left  Result Date: 08/26/2020 CLINICAL DATA:  Fall EXAM: LEFT ELBOW - COMPLETE 3+ VIEW COMPARISON:  None. FINDINGS: There is no  evidence of fracture, dislocation, or joint effusion. There is no evidence of arthropathy or other focal bone abnormality. Soft tissues are unremarkable. IMPRESSION: Negative. Electronically Signed   By: Prudencio Pair M.D.   On: 08/26/2020 02:48   DG Forearm Right  Result Date: 08/26/2020 CLINICAL DATA:  Fall EXAM: RIGHT FOREARM - 2 VIEW COMPARISON:  None. FINDINGS: There is no evidence of fracture  or other focal bone lesions. Soft tissues are unremarkable. IMPRESSION: Negative. Electronically Signed   By: Prudencio Pair M.D.   On: 08/26/2020 02:47   CT Head Wo Contrast  Result Date: 08/26/2020 CLINICAL DATA:  Fall EXAM: CT HEAD WITHOUT CONTRAST TECHNIQUE: Contiguous axial images were obtained from the base of the skull through the vertex without intravenous contrast. COMPARISON:  None. FINDINGS: Brain: No evidence of acute territorial infarction, hemorrhage, hydrocephalus,extra-axial collection or mass lesion/mass effect. There is dilatation the ventricles and sulci consistent with age-related atrophy. Low-attenuation changes in the deep white matter consistent with small vessel ischemia. Vascular: No hyperdense vessel or unexpected calcification. Skull: The skull is intact. No fracture or focal lesion identified. Sinuses/Orbits: The visualized paranasal sinuses and mastoid air cells are clear. The orbits and globes intact. Other: Mild soft tissue swelling with a small hematoma overlying the left frontal. Cervical spine: Alignment: Physiologic Skull base and vertebrae: Visualized skull base is intact. No atlanto-occipital dissociation. The vertebral body heights are well maintained. No fracture or pathologic osseous lesion seen. Soft tissues and spinal canal: The visualized paraspinal soft tissues are unremarkable. No prevertebral soft tissue swelling is seen. The spinal canal is grossly unremarkable, no large epidural collection or significant canal narrowing. Disc levels: Cervical spine spondylosis is seen  with disc height loss and uncovertebral osteophytes most notable at C5-C6 with moderate to severe neural foraminal narrowing. Upper chest: The lung apices are clear. Thoracic inlet is within normal limits. Other: None IMPRESSION: No acute intracranial abnormality. Findings consistent with age related atrophy and chronic small vessel ischemia No acute fracture or malalignment of the spine. Electronically Signed   By: Prudencio Pair M.D.   On: 08/26/2020 03:10   CT Cervical Spine Wo Contrast  Result Date: 08/26/2020 CLINICAL DATA:  Fall EXAM: CT HEAD WITHOUT CONTRAST TECHNIQUE: Contiguous axial images were obtained from the base of the skull through the vertex without intravenous contrast. COMPARISON:  None. FINDINGS: Brain: No evidence of acute territorial infarction, hemorrhage, hydrocephalus,extra-axial collection or mass lesion/mass effect. There is dilatation the ventricles and sulci consistent with age-related atrophy. Low-attenuation changes in the deep white matter consistent with small vessel ischemia. Vascular: No hyperdense vessel or unexpected calcification. Skull: The skull is intact. No fracture or focal lesion identified. Sinuses/Orbits: The visualized paranasal sinuses and mastoid air cells are clear. The orbits and globes intact. Other: Mild soft tissue swelling with a small hematoma overlying the left frontal. Cervical spine: Alignment: Physiologic Skull base and vertebrae: Visualized skull base is intact. No atlanto-occipital dissociation. The vertebral body heights are well maintained. No fracture or pathologic osseous lesion seen. Soft tissues and spinal canal: The visualized paraspinal soft tissues are unremarkable. No prevertebral soft tissue swelling is seen. The spinal canal is grossly unremarkable, no large epidural collection or significant canal narrowing. Disc levels: Cervical spine spondylosis is seen with disc height loss and uncovertebral osteophytes most notable at C5-C6 with  moderate to severe neural foraminal narrowing. Upper chest: The lung apices are clear. Thoracic inlet is within normal limits. Other: None IMPRESSION: No acute intracranial abnormality. Findings consistent with age related atrophy and chronic small vessel ischemia No acute fracture or malalignment of the spine. Electronically Signed   By: Prudencio Pair M.D.   On: 08/26/2020 03:10   DG Hand Complete Left  Result Date: 08/26/2020 CLINICAL DATA:  Fall EXAM: LEFT HAND - COMPLETE 3+ VIEW COMPARISON:  None. FINDINGS: There is no evidence of fracture or dislocation. There is no evidence of arthropathy or  other focal bone abnormality. Soft tissues are unremarkable. IMPRESSION: Negative. Electronically Signed   By: Prudencio Pair M.D.   On: 08/26/2020 02:50    Procedures Procedures (including critical care time)  Medications Ordered in ED Medications - No data to display  ED Course  I have reviewed the triage vital signs and the nursing notes.  Pertinent labs & imaging results that were available during my care of the patient were reviewed by me and considered in my medical decision making (see chart for details).    MDM Rules/Calculators/A&P                          Patient presents following a fall.  Reports mechanical fall and slipping in the bathroom.  She is overall nontoxic and vital signs are reassuring.  ABCs intact.  She has multiple superficial skin tears over the face, bilateral upper extremities and left lower extremity.  She is on Plavix.  CT head and neck obtained given her age and obvious head trauma.  CT scan shows no intracranial bleed, subdural, subarachnoid.  Skin tears were cleaned at the bedside.  Patient has very fragile skin.  Discussed with patient and daughter that I did not feel confident that these would come together with suturing and that suturing would likely cause additional tearing.  They report that she has had multiple skin tears in the past and have wound care materials  at home.  I did apply Steri-Strips to some of the wounds to tacked down the skin tear flaps.  They were given wound care instructions.  After history, exam, and medical workup I feel the patient has been appropriately medically screened and is safe for discharge home. Pertinent diagnoses were discussed with the patient. Patient was given return precautions.  Final Clinical Impression(s) / ED Diagnoses Final diagnoses:  Fall, initial encounter  Traumatic hematoma of forehead, initial encounter  Multiple skin tears    Rx / DC Orders ED Discharge Orders    None       Lakeesha Fontanilla, Barbette Hair, MD 08/26/20 [phone]

## 2020-08-26 NOTE — ED Notes (Signed)
Wounds cleaned and bandages applied. Supplies given to pts daughter. Pt assisted into paper scrubs and blanket to discharge.

## 2020-08-26 NOTE — Discharge Instructions (Addendum)
You were found today to have multiple skin tears after a fall.  DO local wound care with nonadherent dressings.

## 2020-08-27 NOTE — Progress Notes (Signed)
Cardiology Office Note:   Date:  08/29/2020  NAME:  [name]    MRN: [id number] DOB:  August 25, 1932   PCP:  Michael Boston, MD  Cardiologist:  Evalina Field, MD   Referring MD: Michael Boston, MD   Chief Complaint  Patient presents with  . Congestive Heart Failure   History of Present Illness:   [name] is a 86 y.o. female with a hx of CAD, HLD, HFpEF, DM, HTN who presents for follow-up. Recent admission for dCHF. Follow-up today.  Recently admitted to the hospital and diagnosed with diastolic heart failure.  Weights are down around 110 pounds.  The blood pressure is 97/45.  Apparently her primary care physician reduced her dose of losartan to 50 mg a day.  She is still taking Lasix 20 mg a day.  She is watching salt intake.  She also suffered a fall earlier this week.  It was reported to be a mechanical fall.  She had no chest pain or shortness of breath prior to the event.  She reports she was just a bit unsteady on her feet after going to the bathroom.  I do feel she is becoming a bit too dehydrated.  She also was taken off of her diabetes medications.  Most recent A1c was 7.4.  Primary care physicians recommended this be treated with diet.  Given her advanced age and significant comorbidities including frailty this is likely not unreasonable.  Overall, despite her recent fall she appears to be doing well.  She is euvolemic on examination.  She denies any chest pain or shortness of breath.  Problem List 1. Recurrent L pleural effusion s/p pleurex catheter 2.CAD status post stent in 2007/2012 3.Hypertension 4.Hyperlipidemia -Total cholesterol 159, HDL 57, LDL 84, triglycerides 92 5.Arthritis 6.Esophageal dilation 7.Carotid artery stenosis status post right CEA 8. DM -A1c 6.9 9. HFpEF -EF 55-60% -BNP 1055  Past Medical History: Past Medical History:  Diagnosis Date  . Coronary artery disease   . DM (diabetes mellitus) ([place])   . HTN (hypertension)   .  Hyperlipidemia   . Myocardial infarct ([place]) 2012  . Renal disorder     Past Surgical History: Past Surgical History:  Procedure Laterality Date  . CARDIAC CATHETERIZATION    . CAROTID ENDARTERECTOMY    . CORONARY ANGIOPLASTY    . CORONARY STENT PLACEMENT    . IR THORACENTESIS ASP PLEURAL SPACE W/IMG GUIDE  08/13/2020    Current Medications: Current Meds  Medication Sig  . Biotin 5000 MCG TABS Take 5,000 mcg by mouth daily.  . Cholecalciferol (VITAMIN D3 SUPER STRENGTH) 50 MCG (2000 UT) TABS Take 2,000 Units by mouth daily.  . clopidogrel (PLAVIX) 75 MG tablet Take 75 mg by mouth every Monday, Wednesday, and Friday.   . donepezil (ARICEPT) 10 MG tablet Take 1 tablet (10 mg total) by mouth at bedtime.  Marland Kitchen levothyroxine (SYNTHROID) 25 MCG tablet Take 25 mcg by mouth daily before breakfast.  . rosuvastatin (CRESTOR) 40 MG tablet Take 40 mg by mouth at bedtime.   . sertraline (ZOLOFT) 100 MG tablet Take 100 mg by mouth at bedtime.  . [DISCONTINUED] furosemide (LASIX) 20 MG tablet Take 1 tablet (20 mg total) by mouth daily for 21 days. Discuss with the cardiology for further resumption of diuretics  . [DISCONTINUED] losartan (COZAAR) 100 MG tablet Take 50 mg by mouth at bedtime.      Allergies:    Tape, Lisinopril, and Nitrofuran derivatives   Social History: Social  History   Socioeconomic History  . Marital status: Widowed    Spouse name: Not on file  . Number of children: Not on file  . Years of education: Not on file  . Highest education level: Not on file  Occupational History  . Not on file  Tobacco Use  . Smoking status: Former Smoker    Packs/day: 1.00    Years: 2.00    Pack years: 2.00    Types: Cigarettes    Quit date: 11/15/1969    Years since quitting: 50.8  . Smokeless tobacco: Never Used  Substance and Sexual Activity  . Alcohol use: Never  . Drug use: Never  . Sexual activity: Not on file  Other Topics Concern  . Not on file  Social History Narrative   . Not on file   Social Determinants of Health   Financial Resource Strain: Not on file  Food Insecurity: No Food Insecurity  . Worried About Charity fundraiser in the Last Year: Never true  . Ran Out of Food in the Last Year: Never true  Transportation Needs: No Transportation Needs  . Lack of Transportation (Medical): No  . Lack of Transportation (Non-Medical): No  Physical Activity: Not on file  Stress: Not on file  Social Connections: Not on file     Family History: The patient's family history includes Alzheimer's disease in her mother; Heart attack in her father; Heart disease in her father; Heart failure in her brother; Lymphoma in her sister.  ROS:   All other ROS reviewed and negative. Pertinent positives noted in the HPI.     EKGs/Labs/Other Studies Reviewed:   The following studies were personally reviewed by me today:   TTE 08/12/2020  1. Left ventricular ejection fraction, by estimation, is 55 to 60%. The  left ventricle has normal function. The left ventricle has no regional  wall motion abnormalities. There is mild left ventricular hypertrophy.  Left ventricular diastolic parameters  are indeterminate.  2. Right ventricular systolic function is normal. The right ventricular  size is normal. There is moderately elevated pulmonary artery systolic  pressure. The estimated right ventricular systolic pressure is 61.4 mmHg.  3. Trivial to small pericardial effusion. The pericardial effusion is  circumferential. There is no evidence of cardiac tamponade.  4. The mitral valve is abnormal. Mild mitral valve regurgitation.  5. The aortic valve is tricuspid. Aortic valve regurgitation is not  visualized. Mild aortic valve sclerosis is present, with no evidence of  aortic valve stenosis.  6. The inferior vena cava is normal in size with greater than 50%  respiratory variability, suggesting right atrial pressure of 3 mmHg.   Recent Labs: 08/11/2020: ALT 29; B  Natriuretic Peptide 1,055.0 08/13/2020: TSH 1.183 08/14/2020: BUN 30; Creatinine, Ser 1.70; Hemoglobin 9.4; Magnesium 2.2; Platelets 331; Potassium 3.3; Sodium 136   Recent Lipid Panel No results found for: CHOL, TRIG, HDL, CHOLHDL, VLDL, LDLCALC, LDLDIRECT  Physical Exam:   VS:  BP (!) 97/45   Pulse 70   Temp (!) 97.2 F (36.2 C)   Ht 5\' 7"  (1.702 m)   Wt 110 lb 12.8 oz (50.3 kg)   SpO2 98%   BMI 17.35 kg/m    Wt Readings from Last 3 Encounters:  08/29/20 110 lb 12.8 oz (50.3 kg)  08/26/20 109 lb 12.6 oz (49.8 kg)  08/14/20 109 lb 11.2 oz (49.8 kg)    General: Well nourished, well developed, in no acute distress Head: Bruising noted in the left  frontal temporal region Eyes: PEERLA, EOMI  Neck: Supple, no JVD Endocrine: No thryomegaly Cardiac: Normal S1, S2; RRR; no murmurs, rubs, or gallops Lungs: Clear to auscultation bilaterally, no wheezing, rhonchi or rales  Abd: Soft, nontender, no hepatomegaly  Ext: No edema, pulses 2+ Musculoskeletal: No deformities, BUE and BLE strength normal and equal Skin: Warm and dry, no rashes   Neuro: Alert and oriented to person, place, time, and situation, CNII-XII grossly intact, no focal deficits  Psych: Normal mood and affect   ASSESSMENT:   [name] is a 86 y.o. female who presents for the following: 1. Chronic diastolic heart failure ([place])   2. Primary hypertension   3. Coronary artery disease involving native heart without angina pectoris, unspecified vessel or lesion type   4. Other hyperlipidemia     PLAN:   1. Chronic diastolic heart failure ([place]) 2. Primary hypertension -She appears volume down.  I have asked her to stop taking her losartan given her low blood pressures.  They will keep an eye on this.  She should go to taking Lasix 20 mg every other day.  She was given information on salt reductive strategies.  We did discuss Jardiance but apparently they do not want to have any issues with any UTIs.  We did discuss  that fungal UTIs are more common with this medication.  Her most recent A1c is 7.4.  It is probably okay to forego any medication at this time -She will see me back in 3 months.  They will keep an eye on her volume status.  3. Coronary artery disease involving native heart without angina pectoris, unspecified vessel or lesion type -Status post PCI in the past.  No symptoms of angina.  She is on Plavix due to concomitant PAD.  No bleeding.  If all become an issue we may need to stop this.  She does have 1 isolated fall.  She should also continue her Crestor.  Her most recent LDL is not that bad.  Given her age we will keep an eye on this.  4. Other hyperlipidemia -Most recent LDL 84.  Continue Crestor.   Disposition: Return in about 3 months (around 11/27/2020).  Medication Adjustments/Labs and Tests Ordered: Current medicines are reviewed at length with the patient today.  Concerns regarding medicines are outlined above.  No orders of the defined types were placed in this encounter.  Meds ordered this encounter  Medications  . furosemide (LASIX) 20 MG tablet    Sig: Take 1 tablet (20 mg total) by mouth every other day. Take 20mg  (1 tablet) of furosemide every other day    Dispense:  30 tablet    Refill:  3    Patient Instructions  Medication Instructions:  STOP LOSARTAN  START TAKING: LASIX (FUROSEMIDE) 20mg  (1 tablet) EVERY OTHER DAY  *If you need a refill on your cardiac medications before your next appointment, please call your pharmacy*  Follow-Up: At [facility], you and your health needs are our priority.  As part of our continuing mission to provide you with exceptional heart care, we have created designated Provider Care Teams.  These Care Teams include your primary Cardiologist (physician) and Advanced Practice Providers (APPs -  Physician Assistants and Nurse Practitioners) who all work together to provide you with the care you need, when you need it.  Your next  appointment:   3 month(s)  The format for your next appointment:   In Person  Provider:   Eleonore Chiquito, MD  Other Instructions WEIGH YOUR SELF DAILY- WRITE WEIGHT DOWN ON LOG PROVIDED IF YOU GAIN 3 POUNDS OR MORE OVERNIGHT OR 5 POUNDS OR MORE IN 1 WEEK PLEASE CALL OUR OFFICE [phone])        Time Spent with Patient: I have spent a total of 25 minutes with patient reviewing hospital notes, telemetry, EKGs, labs and examining the patient as well as establishing an assessment and plan that was discussed with the patient.  > 50% of time was spent in direct patient care.  Signed, Addison Naegeli. Audie Box, [place]  [address] [place], [place] [address] [phone]  08/29/2020 12:33 PM

## 2020-08-29 ENCOUNTER — Encounter: Payer: Self-pay | Admitting: Cardiovascular Disease

## 2020-08-29 ENCOUNTER — Ambulatory Visit (INDEPENDENT_AMBULATORY_CARE_PROVIDER_SITE_OTHER): Payer: Medicare Other | Admitting: Cardiovascular Disease

## 2020-08-29 ENCOUNTER — Other Ambulatory Visit: Payer: Self-pay

## 2020-08-29 VITALS — BP 97/45 | HR 70 | Temp 97.2°F | Ht 67.0 in | Wt 110.8 lb

## 2020-08-29 DIAGNOSIS — I10 Essential (primary) hypertension: Secondary | ICD-10-CM

## 2020-08-29 DIAGNOSIS — I5032 Chronic diastolic (congestive) heart failure: Secondary | ICD-10-CM | POA: Diagnosis not present

## 2020-08-29 DIAGNOSIS — I2510 Atherosclerotic heart disease of native coronary artery without angina pectoris: Secondary | ICD-10-CM

## 2020-08-29 DIAGNOSIS — E7849 Other hyperlipidemia: Secondary | ICD-10-CM

## 2020-08-29 MED ORDER — FUROSEMIDE 20 MG PO TABS
20.0000 mg | ORAL_TABLET | ORAL | 3 refills | Status: DC
Start: 2020-08-29 — End: 2020-12-06

## 2020-08-29 NOTE — Patient Instructions (Signed)
Medication Instructions:  STOP LOSARTAN  START TAKING: LASIX (FUROSEMIDE) 20mg  (1 tablet) EVERY OTHER DAY  *If you need a refill on your cardiac medications before your next appointment, please call your pharmacy*  Follow-Up: At [facility], you and your health needs are our priority.  As part of our continuing mission to provide you with exceptional heart care, we have created designated Provider Care Teams.  These Care Teams include your primary Cardiologist (physician) and Advanced Practice Providers (APPs -  Physician Assistants and Nurse Practitioners) who all work together to provide you with the care you need, when you need it.  Your next appointment:   3 month(s)  The format for your next appointment:   In Person  Provider:   Eleonore Chiquito, MD  Other Instructions WEIGH YOUR SELF DAILY- WRITE WEIGHT DOWN ON LOG PROVIDED IF YOU GAIN 3 POUNDS OR MORE OVERNIGHT OR 5 POUNDS OR MORE IN 1 WEEK PLEASE CALL OUR OFFICE [phone])

## 2020-09-04 ENCOUNTER — Ambulatory Visit
Admission: RE | Admit: 2020-09-04 | Discharge: 2020-09-04 | Disposition: A | Discharge status: Home / Self Care | Payer: Medicare Other | Source: Ambulatory Visit | Attending: Nephrology | Admitting: Nephrology

## 2020-09-04 DIAGNOSIS — N1832 Chronic kidney disease, stage 3b: Secondary | ICD-10-CM

## 2020-09-04 DIAGNOSIS — R809 Proteinuria, unspecified: Secondary | ICD-10-CM

## 2020-09-04 IMAGING — US US RENAL
1 series · 14 of 25 positions shown · non-contrast
Comparison: None.

Noncontrast chest CT [DATE].

CLINICAL DATA: 88-year-old female with stage III B chronic kidney
disease. Proteinuria.

EXAM:
RENAL / URINARY TRACT ULTRASOUND COMPLETE

[Series 1: us renal · 0.15mm/px · 14 of 26 slices shown]
[im 1/26]
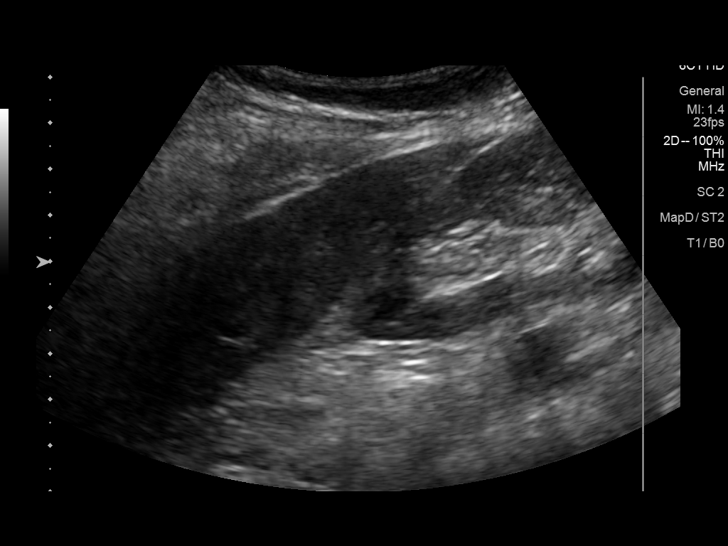
[im 3/26]
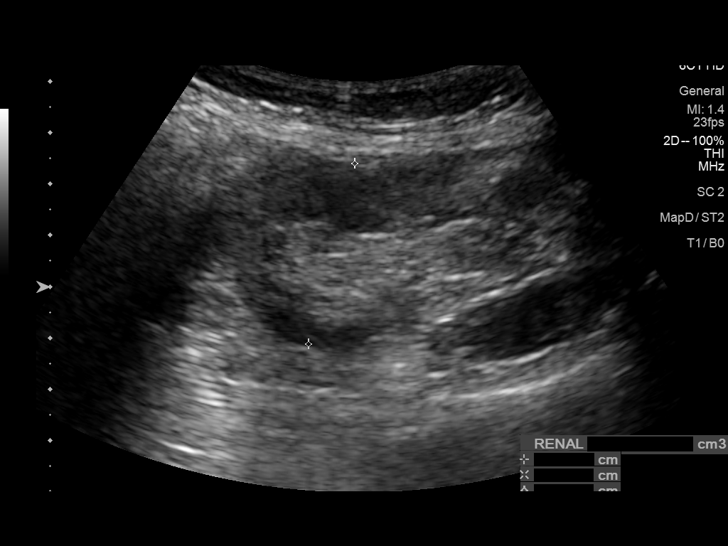
[im 5/26]
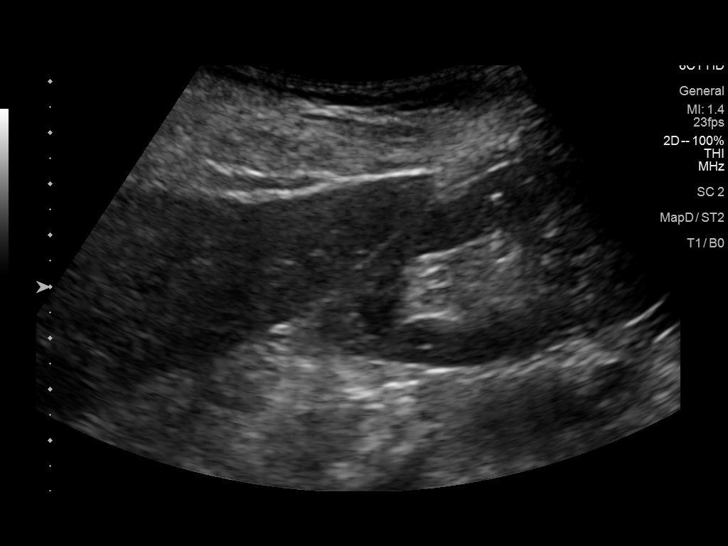
[im 7/26]
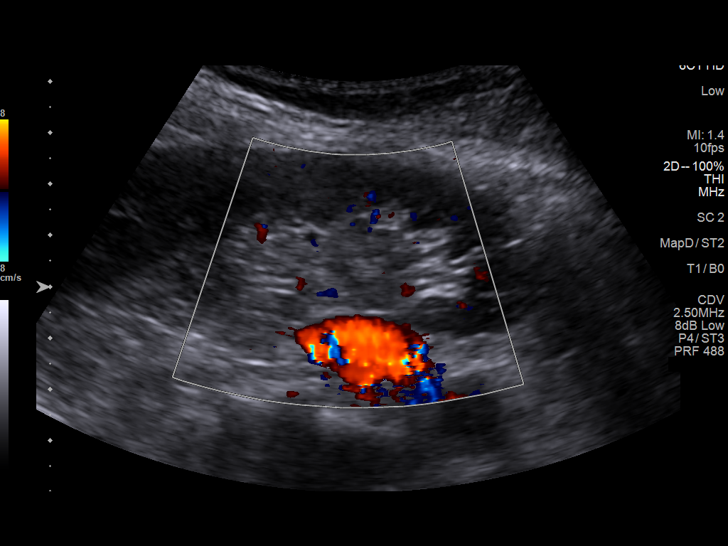
[im 9/26]
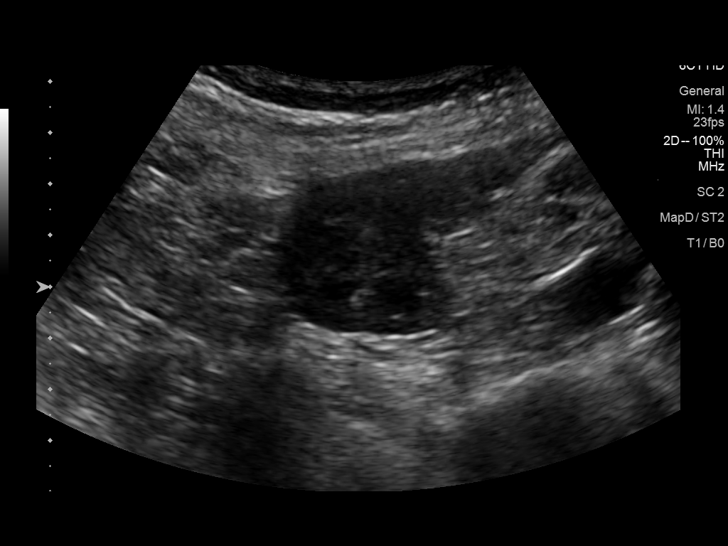
[im 10/26]
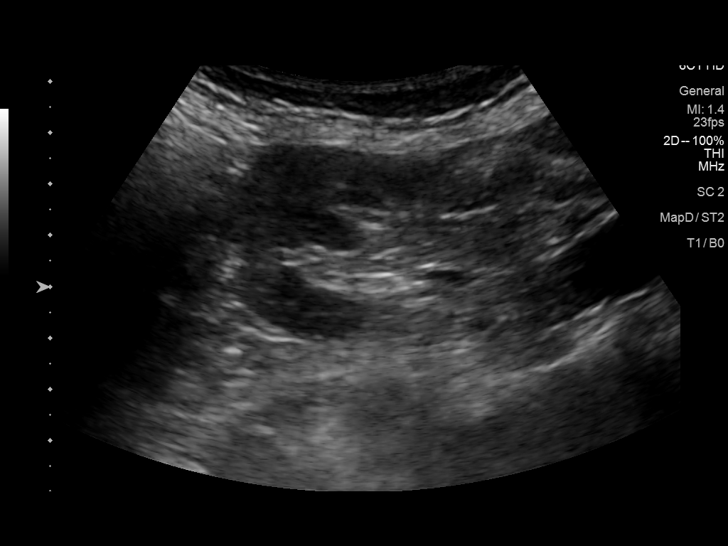
[im 12/26]
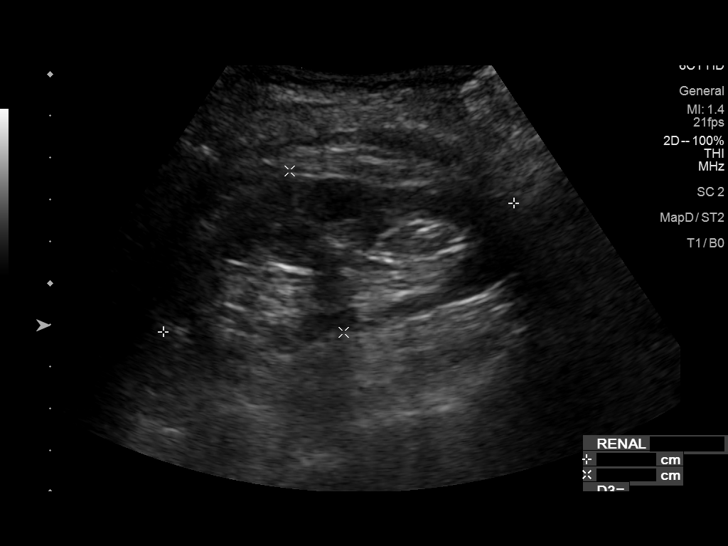
[im 14/26]
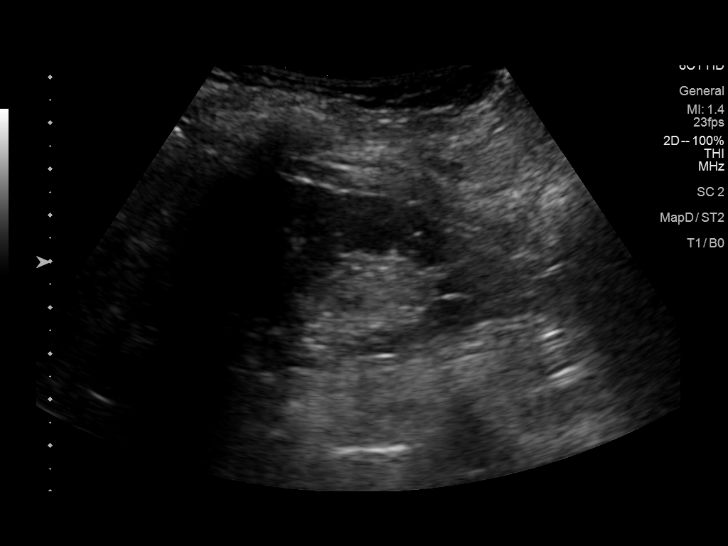
[im 16/26]
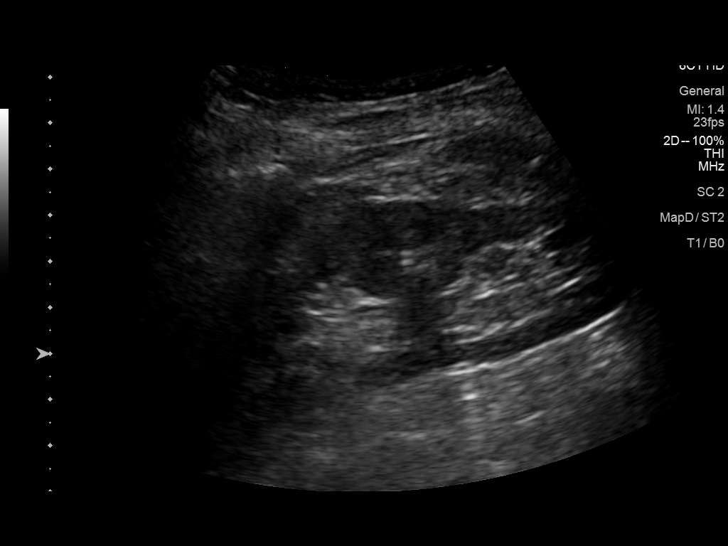
[im 17/26]
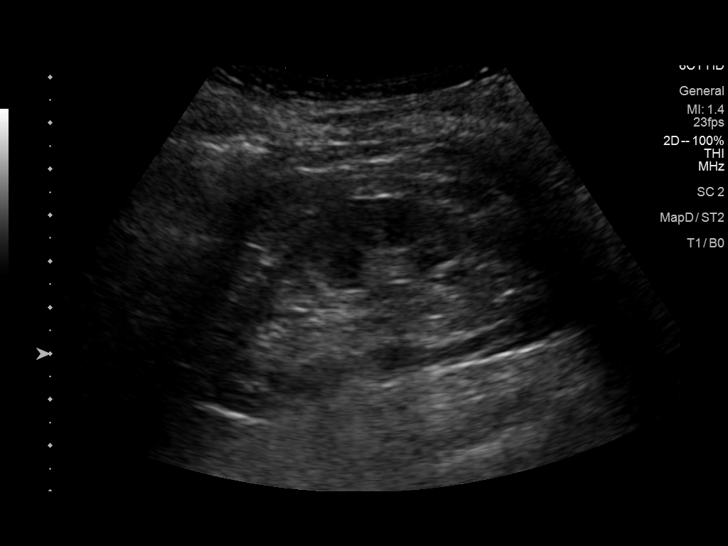
[im 19/26]
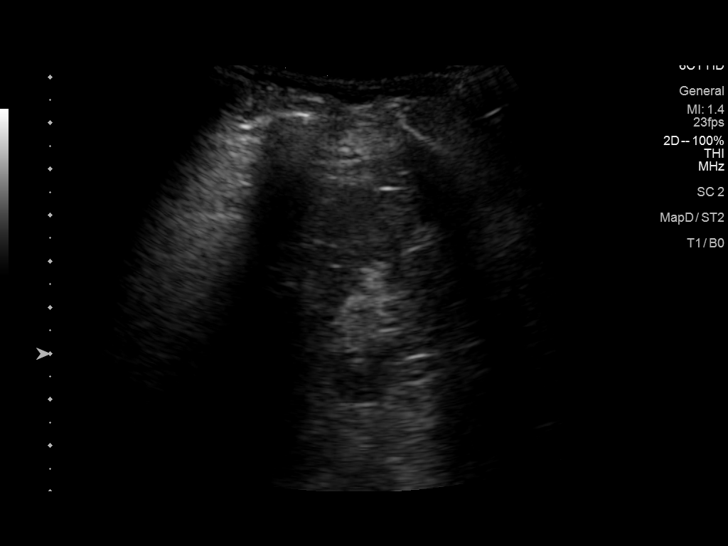
[im 21/26]
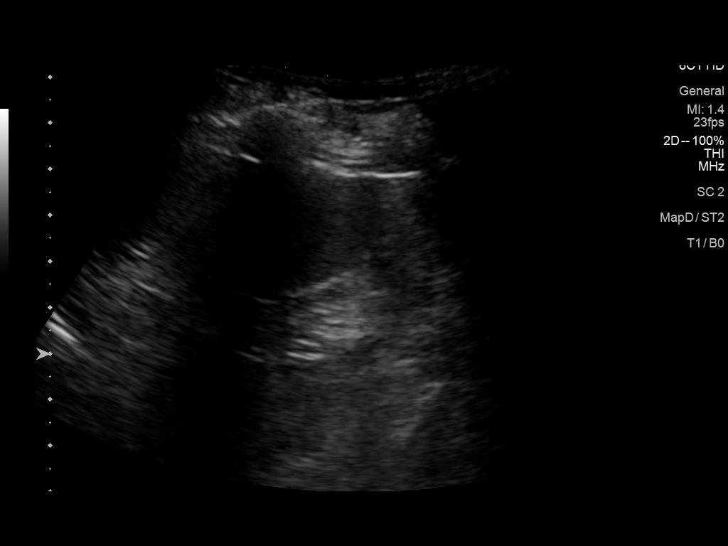
[im 23/26]
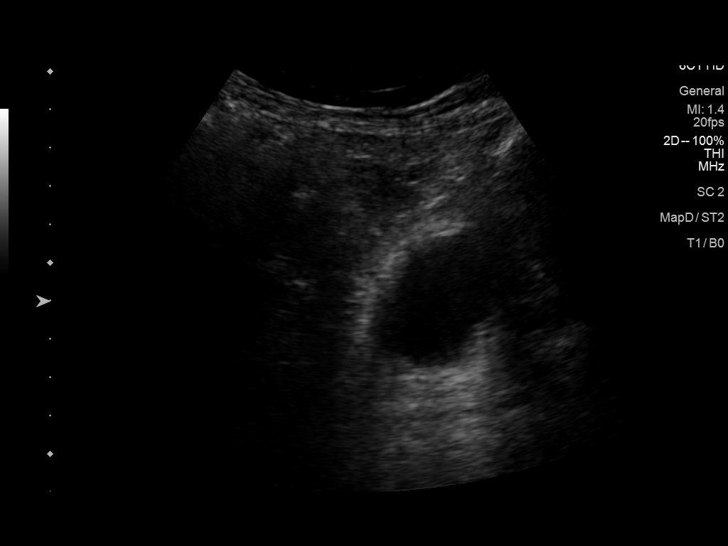
[im 26/26]
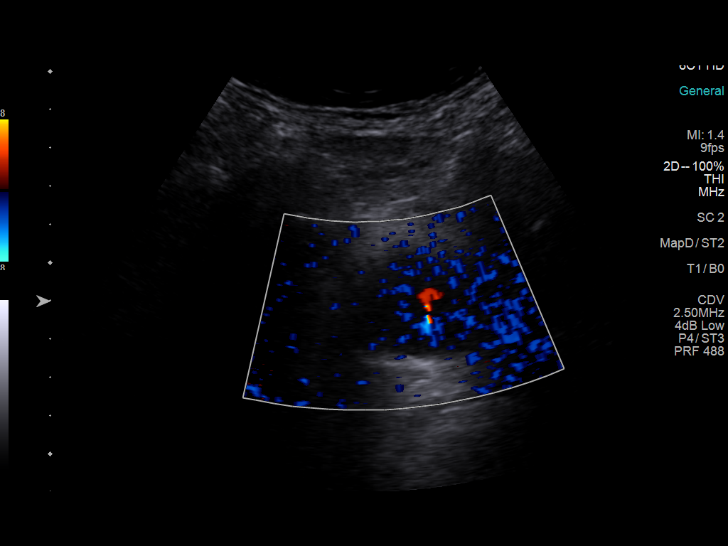

[14 of 25 positions shown; findings below may reference images not displayed]

FINDINGS: Right Kidney:

Renal measurements: 8.1 x 3.7 x 3.6 cm = volume: 58 mL. Maintained
right renal cortical echogenicity . some areas of renal cortical
thinning are noted (image 4). No right hydronephrosis or renal
lesion.

Left Kidney:

Renal measurements: 9.0 x 4.1 x 3.4 cm = volume: 65 mL. Maintained
cortical echogenicity. Cortical thinning is noted. No left
hydronephrosis or renal lesion.

Bladder:

Diminutive. Appears unremarkable for the degree of bladder
distention.
IMPRESSION: Negative aside from bilateral renal cortical thinning/volume loss.

## 2020-09-23 ENCOUNTER — Encounter: Payer: Self-pay | Admitting: Family Medicine

## 2020-09-26 ENCOUNTER — Ambulatory Visit: Payer: Medicare Other | Admitting: Family Medicine

## 2020-11-04 ENCOUNTER — Ambulatory Visit: Payer: Medicare Other | Admitting: Podiatry

## 2020-11-07 ENCOUNTER — Ambulatory Visit: Payer: Medicare Other

## 2020-11-07 ENCOUNTER — Ambulatory Visit (INDEPENDENT_AMBULATORY_CARE_PROVIDER_SITE_OTHER): Payer: Medicare Other | Admitting: Podiatry

## 2020-11-07 ENCOUNTER — Other Ambulatory Visit: Payer: Self-pay

## 2020-11-07 DIAGNOSIS — L89516 Pressure-induced deep tissue damage of right ankle: Secondary | ICD-10-CM | POA: Diagnosis not present

## 2020-11-07 DIAGNOSIS — E119 Type 2 diabetes mellitus without complications: Secondary | ICD-10-CM

## 2020-11-07 NOTE — Patient Instructions (Signed)
Preventing Pressure Injuries  A pressure injury, sometimes called a bedsore or a pressure ulcer, is an injury to the skin and underlying tissue caused by pressure. A pressure injury can happen when your skin presses against a surface, such as a mattress or wheelchair seat, for too long. The pressure on the blood vessels causes reduced blood flow to your skin. This can eventually cause the skin tissue to die and break down into a wound. Pressure injuries usually develop:  Over bony parts of the body, such as the tailbone, shoulders, elbows, hips, and heels.  Under medical devices, such as respiratory equipment, stockings, tubes, and splints. How can this condition affect me? Pressure injuries are caused by a lack of blood supply to an area of skin. These injuries begin as a reddened area on the skin and can become an open sore. They can result from intense pressure over a short period of time or from less pressure over a long period of time. Pressure injuries can vary in severity. They can cause pain, muscle damage, and infection. What can increase my risk? This condition is more likely to develop in people who:  Are in the hospital or an extended care facility.  Are bedridden or in a wheelchair.  Have an injury or disease that keeps them from: ? Moving normally. ? Feeling pain or pressure. ? Communicating if they feel pain or pressure.  Have a condition that: ? Makes them sleepy or less alert. ? Causes poor blood flow.  Need to wear a medical device.  Have poor control of their bladder or bowel functions (incontinence).  Have poor nutrition (malnutrition).  Have had this condition before.  Are of certain ethnicities. People of African American, Latino, or Hispanic descent are at higher risk compared to other ethnic groups. What actions can I take to prevent pressure injuries? Reducing and redistributing pressure  Do not lie or sit in one position for a long time. Move or change  position: ? Every hour when out of bed in a chair. ? Every two hours when in bed. ? As often as told by your health care provider.  Use pillows, wedges, or cushions to redistribute pressure. Ask your health care provider to recommend a mattress, cushions, or pads for you.  Use medical devices that do not rub your skin. Tell your health care provider if one of your medical devices is causing pain or irritation. Skin care If you are in the hospital, your health care providers:  Will inspect your skin, including areas under or around medical devices, at least twice a day.  May recommend that you use certain types of bedding to help prevent pressure injuries. These may include a pad, mattress, or chair cushion that is filled with gel, air, water, or foam.  Will evaluate your nutrition and consult a dietitian if needed.  Will inspect and change any wound dressings regularly.  May help you move into different positions every few hours.  Will adjust any medical devices and braces as needed to limit pressure on your skin.  Will keep your skin clean and dry.  May use gentle cleansers and skin protectants if you are incontinent.  Will moisturize any dry skin. In general, at home:  Keep your skin clean and dry. Gently pat your skin dry.  Do not rub or massage bony areas of your skin.  Moisturize dry skin.  Use gentle cleansers and skin protectants routinely if you are incontinent.  Check your skin at least once   a day for any changes in color and for any new blisters or sores. Make sure to check under and around any medical devices and between skin folds. Have a caregiver do this for you if you are not able.   Lifestyle  Be as active as you can every day. Ask your health care provider to suggest safe exercises or activities.  Do not abuse drugs or alcohol.  Do not use any products that contain nicotine or tobacco, such as cigarettes, e-cigarettes, and chewing tobacco. If you need  help quitting, ask your health care provider. General instructions  Take over-the-counter and prescription medicines only as told by your health care provider.  Work with your health care provider to manage any chronic health conditions.  Eat a healthy diet that includes protein, vitamins, and minerals. Ask your health care provider what types of food you should eat.  Drink enough fluid to keep your urine pale yellow.  Keep all follow-up visits as told by your health care provider. This is important.   Contact a health care provider if you:  Feel or see any changes in your skin. Summary  A pressure injury, sometimes called a bedsore or a pressure ulcer, is an injury to the skin and underlying tissue caused by pressure.  Do not lie or sit in one position for a long time.  Check your skin at least once a day for any changes in color and for any new blisters or sores.  Make sure to check under and around any medical devices and between skin folds. Have a caregiver do this for you if you are not able.  Eat a healthy diet that includes protein, vitamins, and minerals. Ask your health care provider what types of food you should eat. This information is not intended to replace advice given to you by your health care provider. Make sure you discuss any questions you have with your health care provider. Document Revised: 12/30/2018 Document Reviewed: 05/31/2018 Elsevier Patient Education  2021 [employer].  Preventing Pressure Injuries  A pressure injury, sometimes called a bedsore or a pressure ulcer, is an injury to the skin and underlying tissue caused by pressure. A pressure injury can happen when your skin presses against a surface, such as a mattress or wheelchair seat, for too long. The pressure on the blood vessels causes reduced blood flow to your skin. This can eventually cause the skin tissue to die and break down into a wound. Pressure injuries usually develop:  Over bony  parts of the body, such as the tailbone, shoulders, elbows, hips, and heels.  Under medical devices, such as respiratory equipment, stockings, tubes, and splints. How can this condition affect me? Pressure injuries are caused by a lack of blood supply to an area of skin. These injuries begin as a reddened area on the skin and can become an open sore. They can result from intense pressure over a short period of time or from less pressure over a long period of time. Pressure injuries can vary in severity. They can cause pain, muscle damage, and infection. What can increase my risk? This condition is more likely to develop in people who:  Are in the hospital or an extended care facility.  Are bedridden or in a wheelchair.  Have an injury or disease that keeps them from: ? Moving normally. ? Feeling pain or pressure. ? Communicating if they feel pain or pressure.  Have a condition that: ? Makes them sleepy or less alert. ?  Causes poor blood flow.  Need to wear a medical device.  Have poor control of their bladder or bowel functions (incontinence).  Have poor nutrition (malnutrition).  Have had this condition before.  Are of certain ethnicities. People of African American, Latino, or Hispanic descent are at higher risk compared to other ethnic groups. What actions can I take to prevent pressure injuries? Reducing and redistributing pressure  Do not lie or sit in one position for a long time. Move or change position: ? Every hour when out of bed in a chair. ? Every two hours when in bed. ? As often as told by your health care provider.  Use pillows, wedges, or cushions to redistribute pressure. Ask your health care provider to recommend a mattress, cushions, or pads for you.  Use medical devices that do not rub your skin. Tell your health care provider if one of your medical devices is causing pain or irritation. Skin care If you are in the hospital, your health care  providers:  Will inspect your skin, including areas under or around medical devices, at least twice a day.  May recommend that you use certain types of bedding to help prevent pressure injuries. These may include a pad, mattress, or chair cushion that is filled with gel, air, water, or foam.  Will evaluate your nutrition and consult a dietitian if needed.  Will inspect and change any wound dressings regularly.  May help you move into different positions every few hours.  Will adjust any medical devices and braces as needed to limit pressure on your skin.  Will keep your skin clean and dry.  May use gentle cleansers and skin protectants if you are incontinent.  Will moisturize any dry skin. In general, at home:  Keep your skin clean and dry. Gently pat your skin dry.  Do not rub or massage bony areas of your skin.  Moisturize dry skin.  Use gentle cleansers and skin protectants routinely if you are incontinent.  Check your skin at least once a day for any changes in color and for any new blisters or sores. Make sure to check under and around any medical devices and between skin folds. Have a caregiver do this for you if you are not able.   Lifestyle  Be as active as you can every day. Ask your health care provider to suggest safe exercises or activities.  Do not abuse drugs or alcohol.  Do not use any products that contain nicotine or tobacco, such as cigarettes, e-cigarettes, and chewing tobacco. If you need help quitting, ask your health care provider. General instructions  Take over-the-counter and prescription medicines only as told by your health care provider.  Work with your health care provider to manage any chronic health conditions.  Eat a healthy diet that includes protein, vitamins, and minerals. Ask your health care provider what types of food you should eat.  Drink enough fluid to keep your urine pale yellow.  Keep all follow-up visits as told by your  health care provider. This is important.   Contact a health care provider if you:  Feel or see any changes in your skin. Summary  A pressure injury, sometimes called a bedsore or a pressure ulcer, is an injury to the skin and underlying tissue caused by pressure.  Do not lie or sit in one position for a long time.  Check your skin at least once a day for any changes in color and for any new blisters or sores.  Make sure to check under and around any medical devices and between skin folds. Have a caregiver do this for you if you are not able.  Eat a healthy diet that includes protein, vitamins, and minerals. Ask your health care provider what types of food you should eat. This information is not intended to replace advice given to you by your health care provider. Make sure you discuss any questions you have with your health care provider. Document Revised: 12/30/2018 Document Reviewed: 05/31/2018 Elsevier Patient Education  2021 [employer].

## 2020-11-10 ENCOUNTER — Encounter: Payer: Self-pay | Admitting: Podiatry

## 2020-11-10 NOTE — Progress Notes (Signed)
  Subjective:  Patient ID: [name], female    DOB: Apr 10, 1932,  MRN: [id number]  Chief Complaint  Patient presents with  . Ankle Pain    Rt ankle - very tender to touch- unable to apply much pressure- poss pressure sore-   . Callouses    RT foot as well     87 y.o. female presents with the above complaint. History confirmed with patient.  Here with her daughter as well who confirms the history  Objective:  Physical Exam: warm, good capillary refill, no trophic changes or ulcerative lesions, normal DP and PT pulses and normal sensory exam.  Venous stasis and varicose veins are noted throughout the lower extremities.  On the right lateral ankle there is tenderness over the distal fibula and an area of tender skin.  She is very thin and has little soft tissue coverage over her bony prominence no pain over the ATFL, CFL, there is no instability and no pain with range of motion of the ankle   Assessment:   1. Type 2 diabetes mellitus without complication, without long-term current use of insulin ([place])   2. Pressure injury of deep tissue of right ankle      Plan:  Patient was evaluated and treated and all questions answered.  Discussed etiology and treatment of pressure injuries with the patient and her daughter.  I dispensed her heel protector offloading boots.  Think this will resolve quite easily on its own if they can offload the pressure.  Do not think she needs antibiotics or a specific ointment at this point there is no ulcer.  No follow-ups on file.

## 2020-11-25 ENCOUNTER — Telehealth: Payer: Self-pay | Admitting: Podiatry

## 2020-11-25 NOTE — Telephone Encounter (Signed)
Patients daughter called and stated that her mother seen Dr. Sherryle Lis a few weeks ago about a pressure wound, she was giving foot pillows to help with her would. These pillows have not worked, she is in severe pain. Her daughter is wondering what can she do next or is there anything she can apply to the wound or could her mother be sent in an antibiotic. Please call the daughter as soon as possible.

## 2020-11-27 MED ORDER — LIDOCAINE 4 % EX PTCH: 1.0000 "application " | MEDICATED_PATCH | Freq: Every day | CUTANEOUS | 3 refills | Status: DC

## 2020-11-27 NOTE — Telephone Encounter (Signed)
My last visit with her there was no wound. If she has a new wound we should make her an appointment to see me again to re-evaluate

## 2020-11-27 NOTE — Telephone Encounter (Signed)
So the daughter states that its the same pressure wound that she has been having. The daughter wants to know if there is anything else she can do. Mom has dementia and its really hard to get her to appointments.

## 2020-11-27 NOTE — Addendum Note (Signed)
Addended bySherryle Lis, Donda Friedli R on: 11/27/2020 02:07 PM   Modules accepted: Orders

## 2020-11-27 NOTE — Telephone Encounter (Signed)
Thanks you!

## 2020-11-27 NOTE — Telephone Encounter (Signed)
OK. I'll call and discuss

## 2020-11-28 ENCOUNTER — Ambulatory Visit: Payer: Medicare Other | Admitting: Cardiovascular Disease

## 2020-12-06 ENCOUNTER — Ambulatory Visit (INDEPENDENT_AMBULATORY_CARE_PROVIDER_SITE_OTHER): Payer: Medicare Other | Admitting: Cardiovascular Disease

## 2020-12-06 ENCOUNTER — Encounter: Payer: Self-pay | Admitting: Cardiovascular Disease

## 2020-12-06 ENCOUNTER — Other Ambulatory Visit: Payer: Self-pay

## 2020-12-06 VITALS — BP 140/60 | HR 67 | Ht 67.0 in | Wt 113.0 lb

## 2020-12-06 DIAGNOSIS — I2510 Atherosclerotic heart disease of native coronary artery without angina pectoris: Secondary | ICD-10-CM

## 2020-12-06 DIAGNOSIS — I10 Essential (primary) hypertension: Secondary | ICD-10-CM | POA: Diagnosis not present

## 2020-12-06 DIAGNOSIS — E782 Mixed hyperlipidemia: Secondary | ICD-10-CM | POA: Diagnosis not present

## 2020-12-06 DIAGNOSIS — I5032 Chronic diastolic (congestive) heart failure: Secondary | ICD-10-CM

## 2020-12-06 MED ORDER — FUROSEMIDE 20 MG PO TABS: 20.0000 mg | ORAL_TABLET | Freq: Every day | ORAL | 3 refills | Status: AC | PRN

## 2020-12-06 NOTE — Progress Notes (Signed)
Cardiology Office Note:   Date:  12/06/2020  NAME:  [name]    MRN: [id number] DOB:  08/10/1932   PCP:  Michael Boston, MD  Cardiologist:  Evalina Field, MD  Electrophysiologist:  None   Referring MD: Michael Boston, MD   Chief Complaint  Patient presents with  . Congestive Heart Failure   History of Present Illness:   [name] is a 87 y.o. female with a hx of CAD, HFpEF, HLD, HTN who presents for follow-up.  She seems to be doing well.  Weights are stable.  She does go to the bathroom frequently for Lasix.  We discussed taking this daily as needed.  She has a daughter in agreement.  She is having no bleeding issues.  She does have an area of skin breakdown on her right lower extremity is being watched by her podiatrist.  No recent falls.  BP well controlled.  Volume status very acceptable today.  She denies any significant chest pain or shortness of breath.  Overall doing well.  Problem List 1. Recurrent L pleural effusion  -s/p pleurex catheter 2.CAD  -status post stent in 2007/2012 3.Hypertension 4.Hyperlipidemia -Total cholesterol 159, HDL 57, LDL 84, triglycerides 92 5.Arthritis 6.Esophageal dilation 7.Carotid artery stenosis status post right CEA 8. DM -A1c6.9 9. HFpEF -EF 55-60% -BNP 1055 10. Dementia   Past Medical History: Past Medical History:  Diagnosis Date  . Coronary artery disease   . DM (diabetes mellitus) ([place])   . HTN (hypertension)   . Hyperlipidemia   . Myocardial infarct ([place]) 2012  . Renal disorder     Past Surgical History: Past Surgical History:  Procedure Laterality Date  . CARDIAC CATHETERIZATION    . CAROTID ENDARTERECTOMY    . CORONARY ANGIOPLASTY    . CORONARY STENT PLACEMENT    . IR THORACENTESIS ASP PLEURAL SPACE W/IMG GUIDE  08/13/2020    Current Medications: Current Meds  Medication Sig  . Biotin 5000 MCG TABS Take 5,000 mcg by mouth daily.  . cephALEXin (KEFLEX) 250 MG capsule Take by mouth.   . Cholecalciferol (VITAMIN D3 SUPER STRENGTH) 50 MCG (2000 UT) TABS Take 2,000 Units by mouth daily.  . clopidogrel (PLAVIX) 75 MG tablet Take 75 mg by mouth every Monday, Wednesday, and Friday.   . donepezil (ARICEPT) 10 MG tablet Take 1 tablet (10 mg total) by mouth at bedtime.  Marland Kitchen levothyroxine (SYNTHROID) 25 MCG tablet Take 25 mcg by mouth daily before breakfast.  . Lidocaine (HM LIDOCAINE PATCH) 4 % PTCH Apply 1 application topically daily. Cut to size and apply over painful hours for up to 12 hours, do not wear for more than 12 hours at a time  . losartan (COZAAR) 100 MG tablet Take 100 mg by mouth daily.  . metoprolol succinate (TOPROL-XL) 50 MG 24 hr tablet Take 50 mg by mouth daily.  . pioglitazone (ACTOS) 15 MG tablet Take 15 mg by mouth daily.  . rosuvastatin (CRESTOR) 40 MG tablet Take 40 mg by mouth at bedtime.   . sertraline (ZOLOFT) 100 MG tablet Take 100 mg by mouth at bedtime.  . [DISCONTINUED] furosemide (LASIX) 20 MG tablet Take 1 tablet (20 mg total) by mouth every other day. Take 20mg  (1 tablet) of furosemide every other day     Allergies:    Tape, Lisinopril, and Nitrofuran derivatives   Social History: Social History   Socioeconomic History  . Marital status: Widowed    Spouse name: Not on file  .  Number of children: Not on file  . Years of education: Not on file  . Highest education level: Not on file  Occupational History  . Not on file  Tobacco Use  . Smoking status: Former Smoker    Packs/day: 1.00    Years: 2.00    Pack years: 2.00    Types: Cigarettes    Quit date: 11/15/1969    Years since quitting: 51.0  . Smokeless tobacco: Never Used  Substance and Sexual Activity  . Alcohol use: Never  . Drug use: Never  . Sexual activity: Not on file  Other Topics Concern  . Not on file  Social History Narrative  . Not on file   Social Determinants of Health   Financial Resource Strain: Not on file  Food Insecurity: No Food Insecurity  . Worried  About Charity fundraiser in the Last Year: Never true  . Ran Out of Food in the Last Year: Never true  Transportation Needs: No Transportation Needs  . Lack of Transportation (Medical): No  . Lack of Transportation (Non-Medical): No  Physical Activity: Not on file  Stress: Not on file  Social Connections: Not on file     Family History: The patient's family history includes Alzheimer's disease in her mother; Heart attack in her father; Heart disease in her father; Heart failure in her brother; Lymphoma in her sister.  ROS:   All other ROS reviewed and negative. Pertinent positives noted in the HPI.     EKGs/Labs/Other Studies Reviewed:   The following studies were personally reviewed by me today:  Recent Labs: 08/11/2020: ALT 29; B Natriuretic Peptide 1,055.0 08/13/2020: TSH 1.183 08/14/2020: BUN 30; Creatinine, Ser 1.70; Hemoglobin 9.4; Magnesium 2.2; Platelets 331; Potassium 3.3; Sodium 136   Recent Lipid Panel No results found for: CHOL, TRIG, HDL, CHOLHDL, VLDL, LDLCALC, LDLDIRECT  Physical Exam:   VS:  BP 140/60   Pulse 67   Ht 5\' 7"  (1.702 m)   Wt 113 lb (51.3 kg)   SpO2 97%   BMI 17.70 kg/m    Wt Readings from Last 3 Encounters:  12/06/20 113 lb (51.3 kg)  08/29/20 110 lb 12.8 oz (50.3 kg)  08/26/20 109 lb 12.6 oz (49.8 kg)    General: Well nourished, well developed, in no acute distress Head: Atraumatic, normal size  Eyes: PEERLA, EOMI  Neck: Supple, no JVD Endocrine: No thryomegaly Cardiac: Normal S1, S2; RRR; no murmurs, rubs, or gallops Lungs: Clear to auscultation bilaterally, no wheezing, rhonchi or rales  Abd: Soft, nontender, no hepatomegaly  Ext: No edema, venous insufficiency changes noted Musculoskeletal: No deformities, BUE and BLE strength normal and equal Skin: Warm and dry, no rashes   Neuro: Alert and oriented to person, place, time, and situation, CNII-XII grossly intact, no focal deficits  Psych: Normal mood and affect   ASSESSMENT:    [name] is a 87 y.o. female who presents for the following: 1. Chronic diastolic heart failure ([place])   2. Primary hypertension   3. Coronary artery disease involving native heart without angina pectoris, unspecified vessel or lesion type   4. Mixed hyperlipidemia     PLAN:   1. Chronic diastolic heart failure ([facility]) -Volume status acceptable.  Reduce Lasix to 20 mg daily as needed.  2. Primary hypertension -Well-controlled no change in medications.  3. Coronary artery disease involving native heart without angina pectoris, unspecified vessel or lesion type 4. Mixed hyperlipidemia -No symptoms of angina.  Continue Plavix therapy.  She  is also on Crestor 40 mg daily.  Most recent LDL cholesterol 84.  Given her age I think this is acceptable.  She also has dementia.  Unclear benefit of aggressive medical care at this point.  Disposition: Return in about 6 months (around 06/08/2021).  Medication Adjustments/Labs and Tests Ordered: Current medicines are reviewed at length with the patient today.  Concerns regarding medicines are outlined above.  No orders of the defined types were placed in this encounter.  Meds ordered this encounter  Medications  . furosemide (LASIX) 20 MG tablet    Sig: Take 1 tablet (20 mg total) by mouth daily as needed (weight gain 2-3 lbs.).    Dispense:  30 tablet    Refill:  3    Patient Instructions  Medication Instructions:  Take Lasix daily as needed- if you have 2-3lbs weight increase.  *If you need a refill on your cardiac medications before your next appointment, please call your pharmacy*   Follow-Up: At [facility], you and your health needs are our priority.  As part of our continuing mission to provide you with exceptional heart care, we have created designated Provider Care Teams.  These Care Teams include your primary Cardiologist (physician) and Advanced Practice Providers (APPs -  Physician Assistants and Nurse Practitioners) who  all work together to provide you with the care you need, when you need it.  We recommend signing up for the patient portal called "MyChart".  Sign up information is provided on this After Visit Summary.  MyChart is used to connect with patients for Virtual Visits (Telemedicine).  Patients are able to view lab/test results, encounter notes, upcoming appointments, etc.  Non-urgent messages can be sent to your provider as well.   To learn more about what you can do with MyChart, go to [email or web address].    Your next appointment:   6 month(s)  The format for your next appointment:   In Person  Provider:   Eleonore Chiquito, MD       Time Spent with Patient: I have spent a total of 25 minutes with patient reviewing hospital notes, telemetry, EKGs, labs and examining the patient as well as establishing an assessment and plan that was discussed with the patient.  > 50% of time was spent in direct patient care.  Signed, Addison Naegeli. Audie Box, MD, [place]  [address], [place], [place] [address] [phone]  12/06/2020 5:07 PM

## 2020-12-06 NOTE — Patient Instructions (Signed)
Medication Instructions:  Take Lasix daily as needed- if you have 2-3lbs weight increase.  *If you need a refill on your cardiac medications before your next appointment, please call your pharmacy*   Follow-Up: At [facility], you and your health needs are our priority.  As part of our continuing mission to provide you with exceptional heart care, we have created designated Provider Care Teams.  These Care Teams include your primary Cardiologist (physician) and Advanced Practice Providers (APPs -  Physician Assistants and Nurse Practitioners) who all work together to provide you with the care you need, when you need it.  We recommend signing up for the patient portal called "MyChart".  Sign up information is provided on this After Visit Summary.  MyChart is used to connect with patients for Virtual Visits (Telemedicine).  Patients are able to view lab/test results, encounter notes, upcoming appointments, etc.  Non-urgent messages can be sent to your provider as well.   To learn more about what you can do with MyChart, go to [email or web address].    Your next appointment:   6 month(s)  The format for your next appointment:   In Person  Provider:   Eleonore Chiquito, MD

## 2021-01-09 ENCOUNTER — Other Ambulatory Visit (HOSPITAL_COMMUNITY): Payer: Self-pay

## 2021-01-09 MED ORDER — MOLNUPIRAVIR 200 MG PO CAPS
ORAL_CAPSULE | ORAL | 0 refills | Status: DC
Start: 2021-01-09 — End: 2021-08-04
  Filled 2021-01-09: qty 40, 5d supply, fill #0

## 2021-02-02 ENCOUNTER — Emergency Department (HOSPITAL_BASED_OUTPATIENT_CLINIC_OR_DEPARTMENT_OTHER)
Admission: EM | Admit: 2021-02-02 | Discharge: 2021-02-02 | Discharge status: Against Medical Advice | Payer: Medicare Other | Attending: Emergency Medicine | Admitting: Emergency Medicine

## 2021-02-02 ENCOUNTER — Emergency Department (HOSPITAL_BASED_OUTPATIENT_CLINIC_OR_DEPARTMENT_OTHER): Payer: Medicare Other

## 2021-02-02 ENCOUNTER — Encounter (HOSPITAL_BASED_OUTPATIENT_CLINIC_OR_DEPARTMENT_OTHER): Payer: Self-pay | Admitting: Emergency Medicine

## 2021-02-02 ENCOUNTER — Other Ambulatory Visit: Payer: Self-pay

## 2021-02-02 DIAGNOSIS — Y92002 Bathroom of unspecified non-institutional (private) residence single-family (private) house as the place of occurrence of the external cause: Secondary | ICD-10-CM | POA: Diagnosis not present

## 2021-02-02 DIAGNOSIS — Z87891 Personal history of nicotine dependence: Secondary | ICD-10-CM | POA: Insufficient documentation

## 2021-02-02 DIAGNOSIS — W010XXA Fall on same level from slipping, tripping and stumbling without subsequent striking against object, initial encounter: Secondary | ICD-10-CM | POA: Diagnosis not present

## 2021-02-02 DIAGNOSIS — I509 Heart failure, unspecified: Secondary | ICD-10-CM | POA: Diagnosis not present

## 2021-02-02 DIAGNOSIS — N189 Chronic kidney disease, unspecified: Secondary | ICD-10-CM | POA: Diagnosis not present

## 2021-02-02 DIAGNOSIS — I2510 Atherosclerotic heart disease of native coronary artery without angina pectoris: Secondary | ICD-10-CM | POA: Insufficient documentation

## 2021-02-02 DIAGNOSIS — E1122 Type 2 diabetes mellitus with diabetic chronic kidney disease: Secondary | ICD-10-CM | POA: Insufficient documentation

## 2021-02-02 DIAGNOSIS — S4992XA Unspecified injury of left shoulder and upper arm, initial encounter: Secondary | ICD-10-CM | POA: Diagnosis present

## 2021-02-02 DIAGNOSIS — Z79899 Other long term (current) drug therapy: Secondary | ICD-10-CM | POA: Insufficient documentation

## 2021-02-02 DIAGNOSIS — S41112A Laceration without foreign body of left upper arm, initial encounter: Secondary | ICD-10-CM | POA: Insufficient documentation

## 2021-02-02 DIAGNOSIS — M25552 Pain in left hip: Secondary | ICD-10-CM | POA: Insufficient documentation

## 2021-02-02 DIAGNOSIS — I130 Hypertensive heart and chronic kidney disease with heart failure and stage 1 through stage 4 chronic kidney disease, or unspecified chronic kidney disease: Secondary | ICD-10-CM | POA: Insufficient documentation

## 2021-02-02 DIAGNOSIS — M25551 Pain in right hip: Secondary | ICD-10-CM | POA: Diagnosis not present

## 2021-02-02 DIAGNOSIS — W19XXXA Unspecified fall, initial encounter: Secondary | ICD-10-CM

## 2021-02-02 DIAGNOSIS — Z5321 Procedure and treatment not carried out due to patient leaving prior to being seen by health care provider: Secondary | ICD-10-CM | POA: Insufficient documentation

## 2021-02-02 IMAGING — DX DG HIP (WITH OR WITHOUT PELVIS) 5+V BILAT
5 series · 5 of 5 positions shown · non-contrast
Comparison: None.

CLINICAL DATA: Pain following fall

EXAM:
DG HIP (WITH OR WITHOUT PELVIS) 5+V BILAT

[pelvis ap]
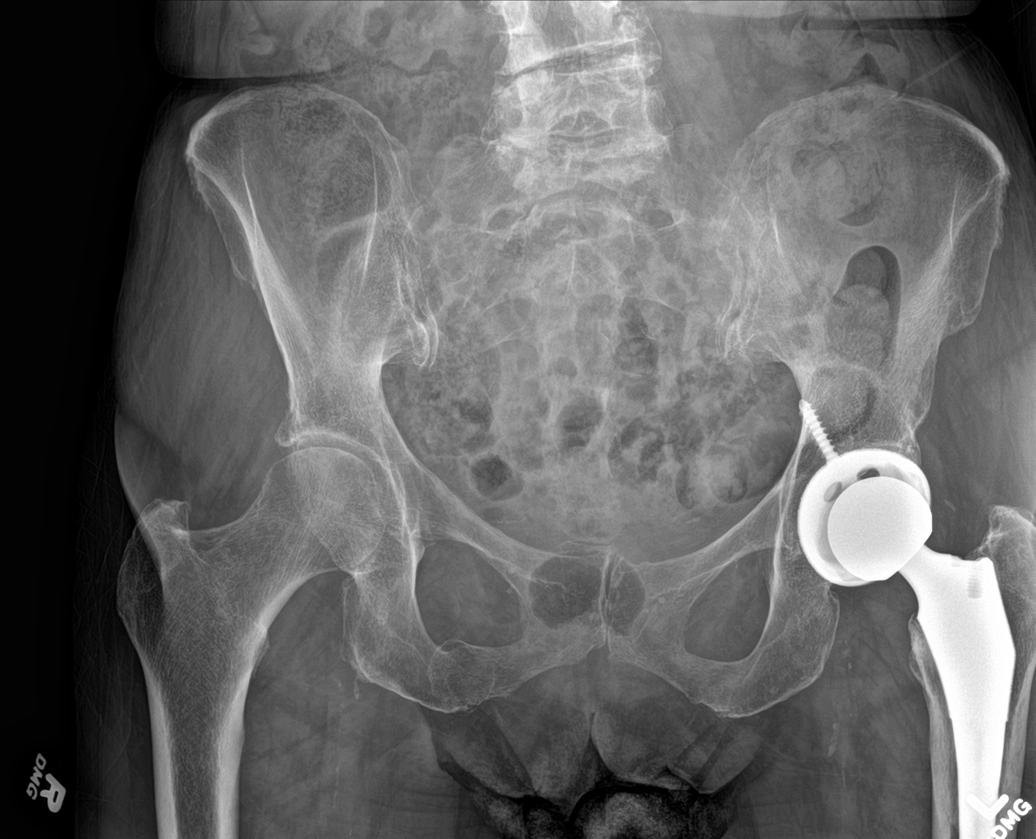

[hip ap (1 of 2)]
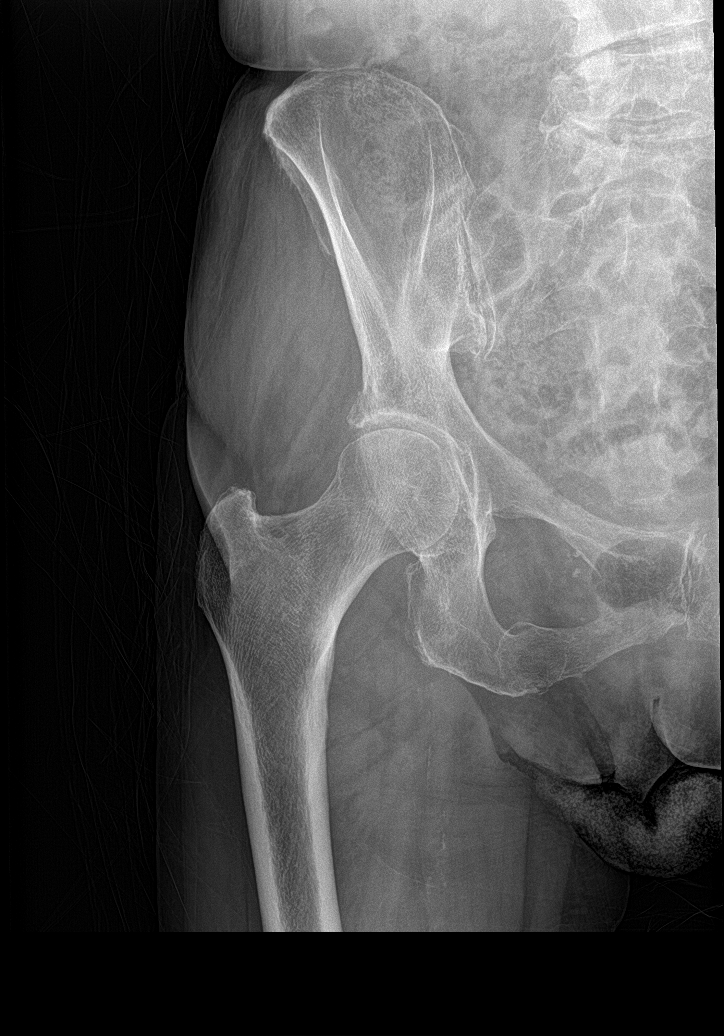

[hip lat (1 of 2)]
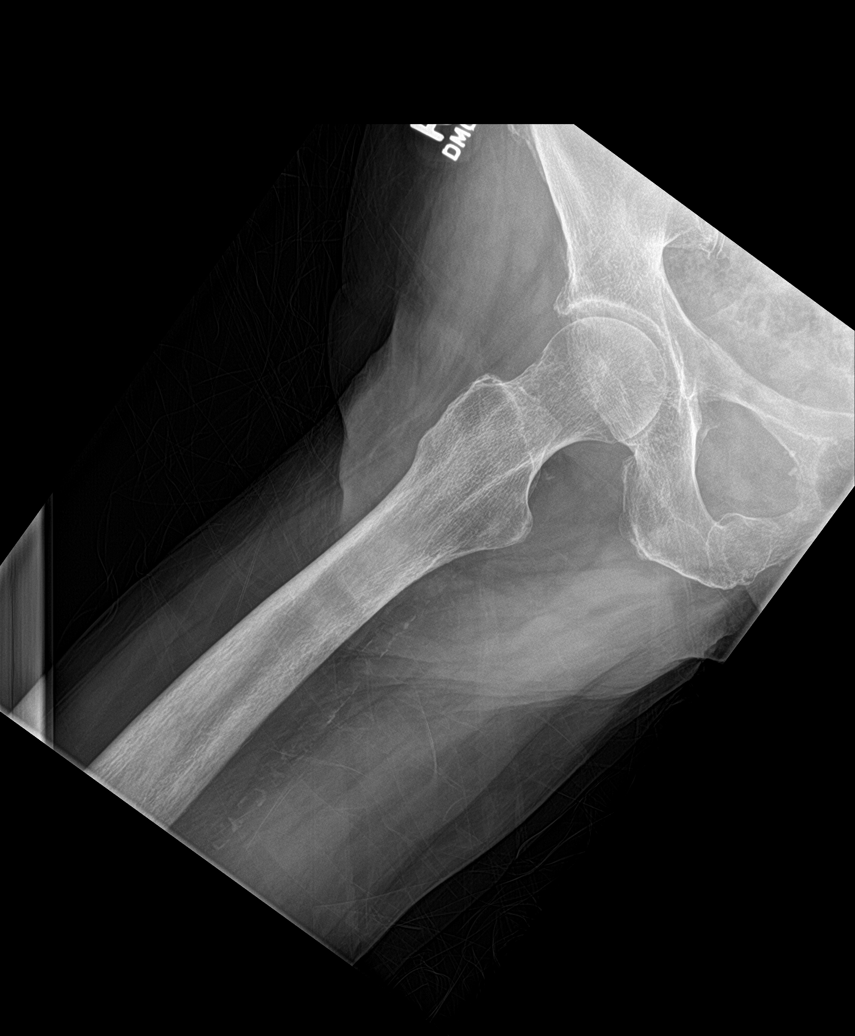

[hip ap (2 of 2)]
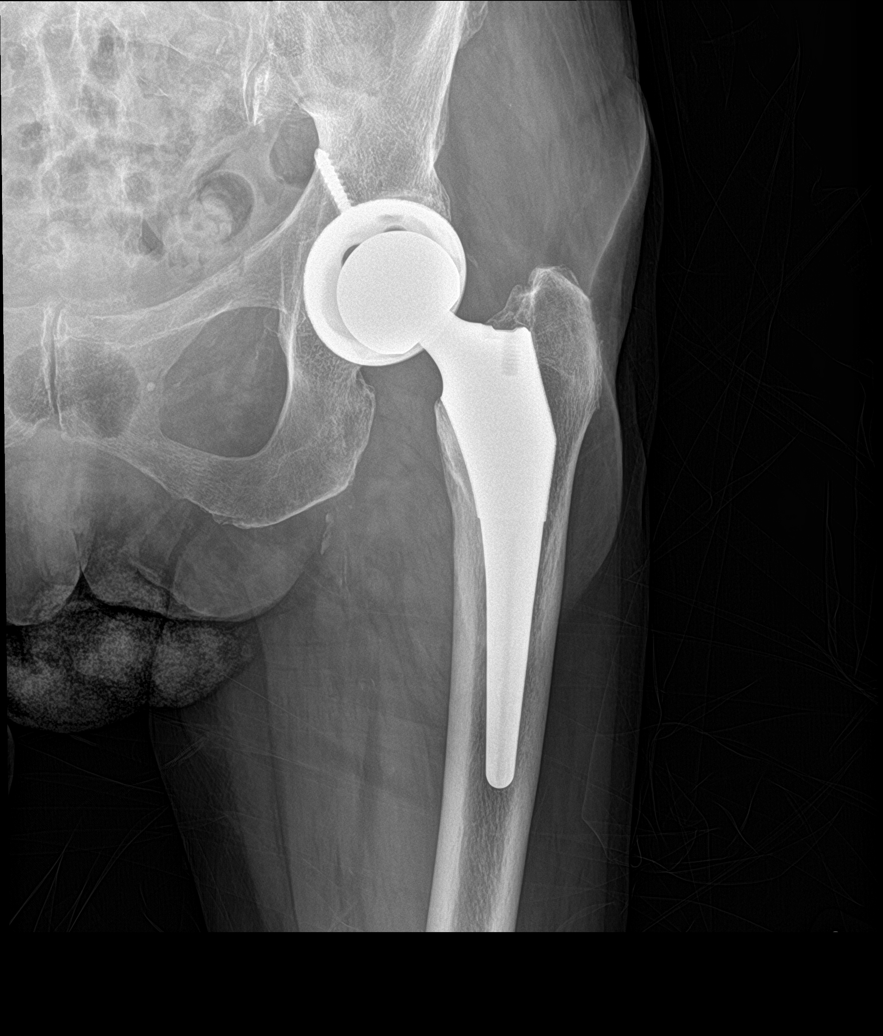

[hip lat (2 of 2)]
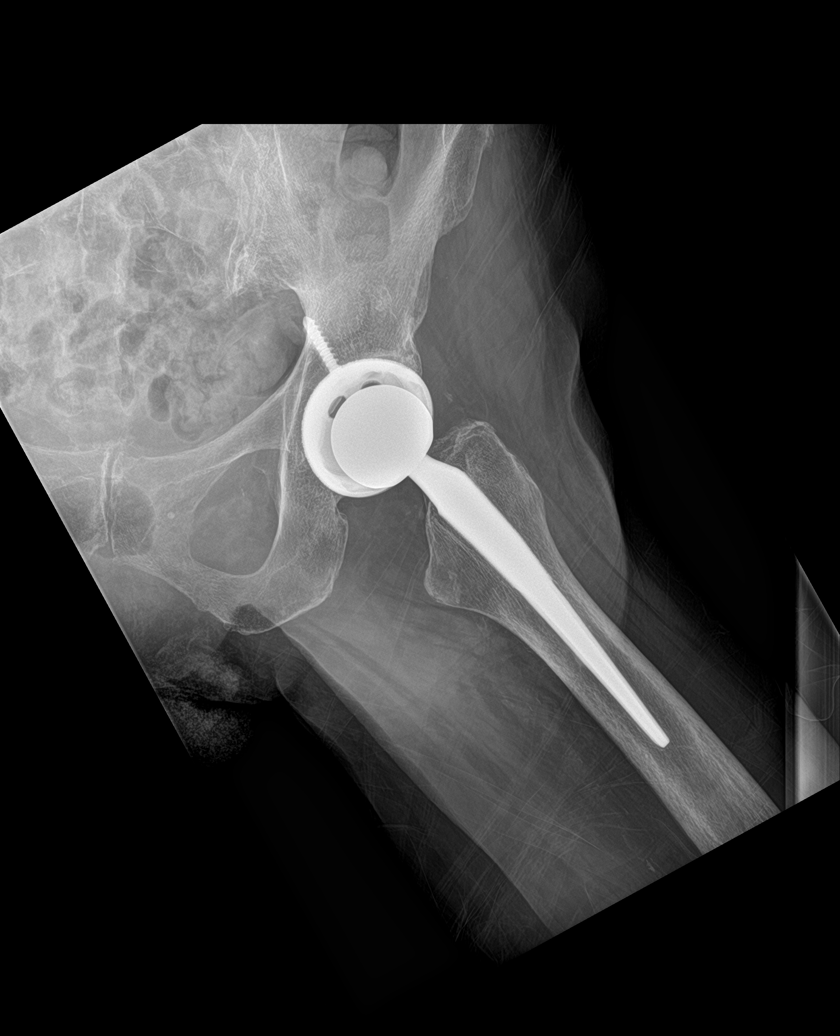

[5 of 5 positions shown; findings below may reference images not displayed]

FINDINGS: Frontal pelvis as well as frontal and lateral views of each hip
joint obtained. There is a total hip replacement on the left with
prosthetic components well-seated. There is evidence of an old
fracture of the right ischium with remodeling. No acute fracture or
dislocation. Slight narrowing right hip joint. There is degenerative
change in lower lumbar spine.
IMPRESSION: Total hip replacement on the left with prosthetic components
well-seated. Old healed fracture right ischium. No acute fracture or
dislocation. Mild narrowing right hip joint. Degenerative change in
lower lumbar spine.

## 2021-02-02 IMAGING — CT CT HIP*R* W/O CM
2 of 3 series · 17 of 46 positions shown, 19 images · non-contrast
Comparison: Same day hip radiograph

CLINICAL DATA: Hip pain stress fracture suspected.

EXAM:
CT OF THE RIGHT HIP WITHOUT CONTRAST
TECHNIQUE: Multidetector CT imaging of the right hip was performed according to
the standard protocol. Multiplanar CT image reconstructions were
also generated.

[Series 6: axial soft tissue · axial · 0.47mm/px · z∈[-590,-374]mm · 14 of 124 slices shown, 16 images]
[im 8/124  soft-tissue]
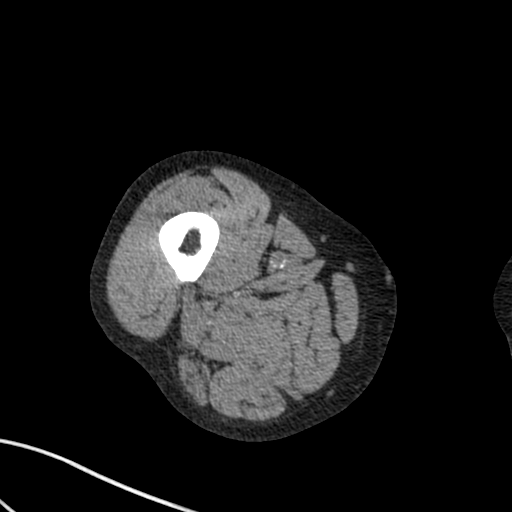
[im 8/124  bone]
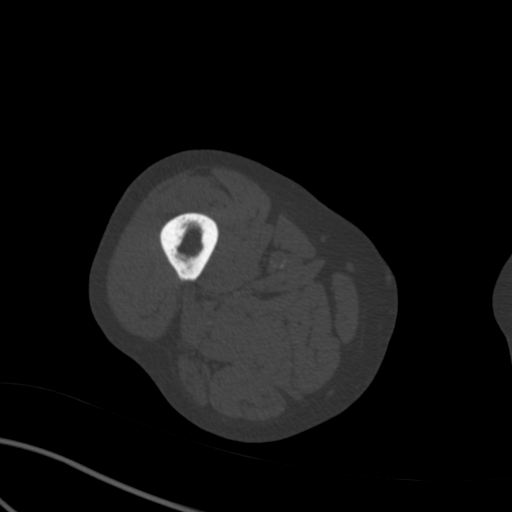
[im 16/124  soft-tissue]
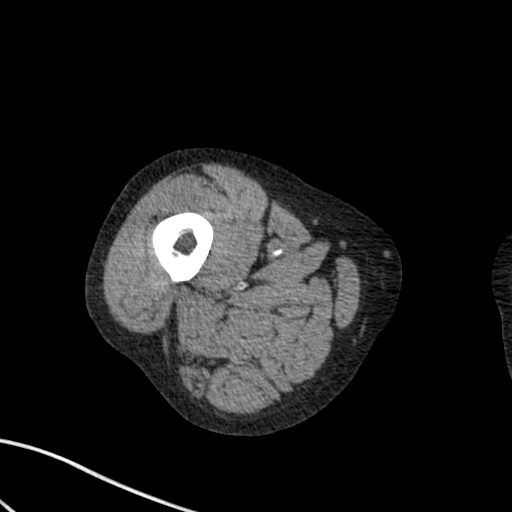
[im 24/124  soft-tissue]
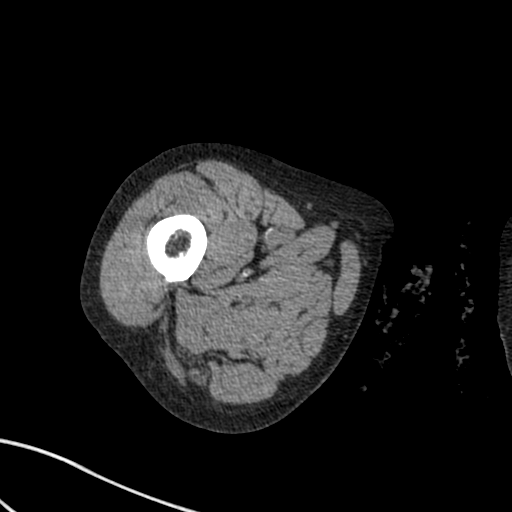
[im 32/124  soft-tissue]
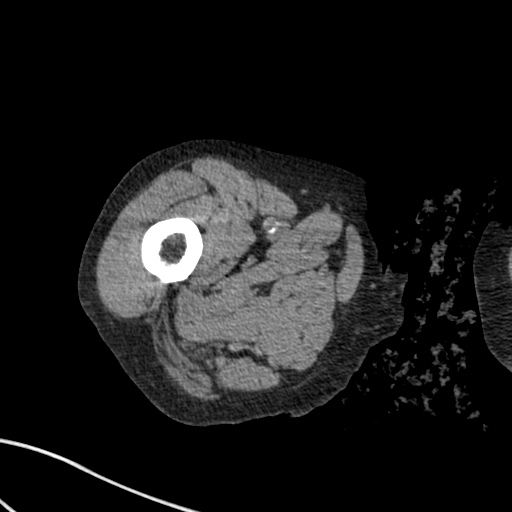
[im 40/124  soft-tissue]
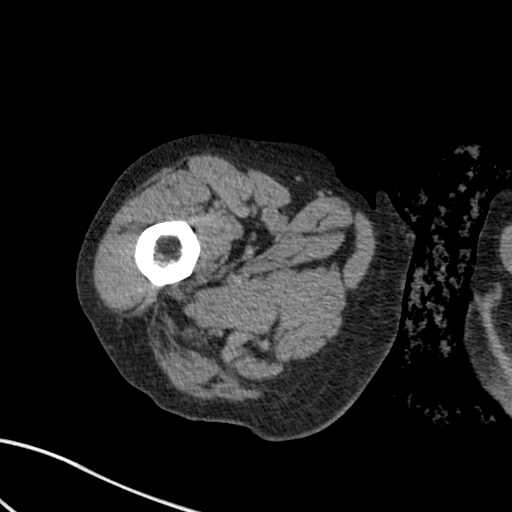
[im 48/124  soft-tissue]
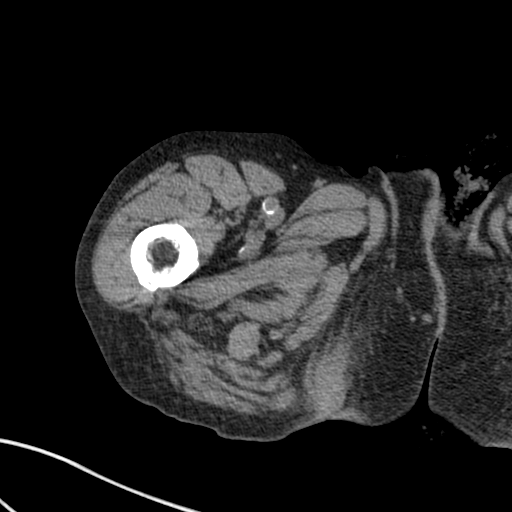
[im 56/124  soft-tissue]
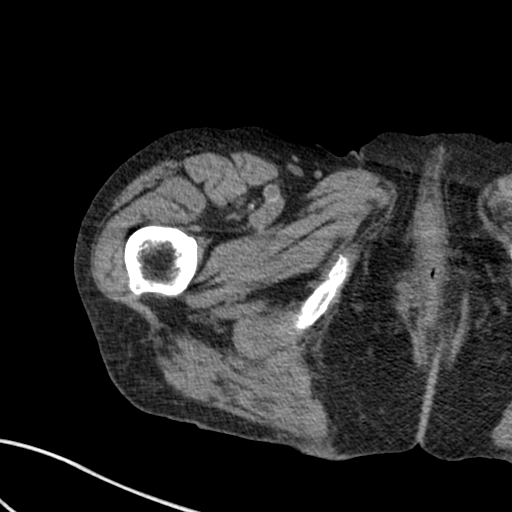
[im 68/124  soft-tissue]
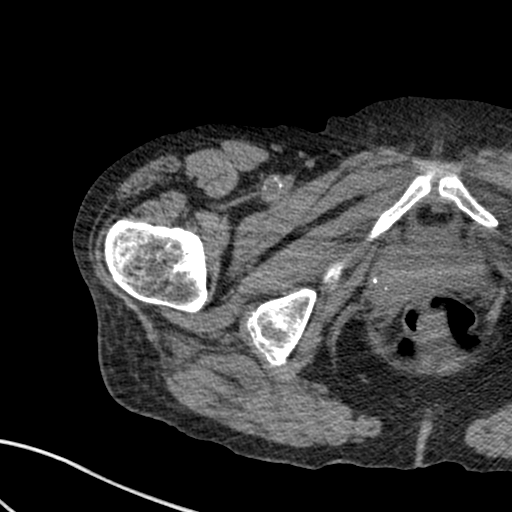
[im 76/124  soft-tissue]
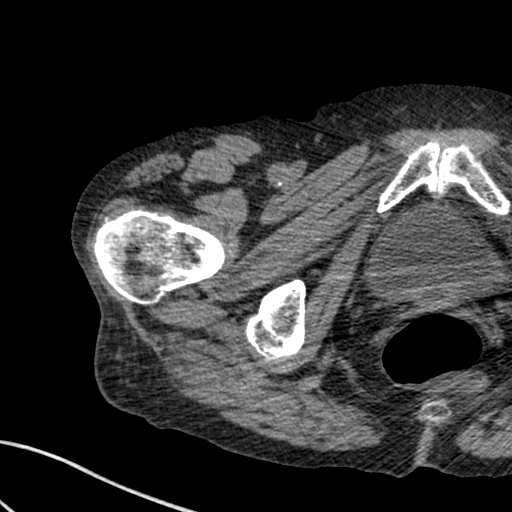
[im 76/124  bone]
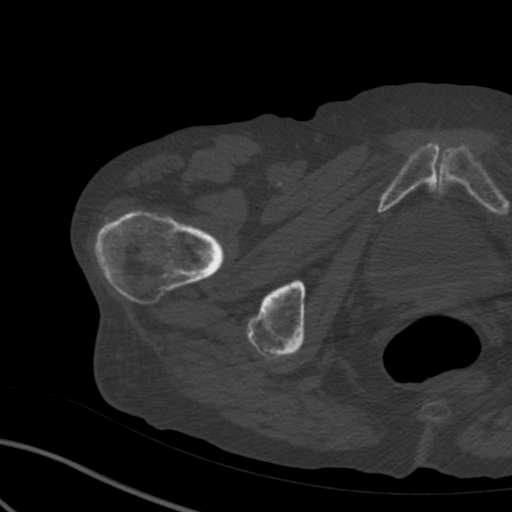
[im 84/124  soft-tissue]
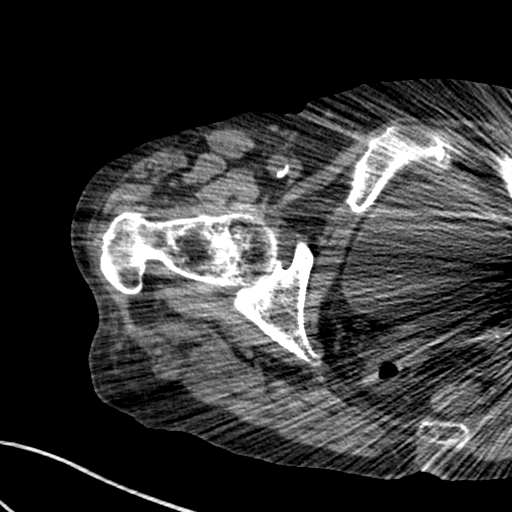
[im 92/124  soft-tissue]
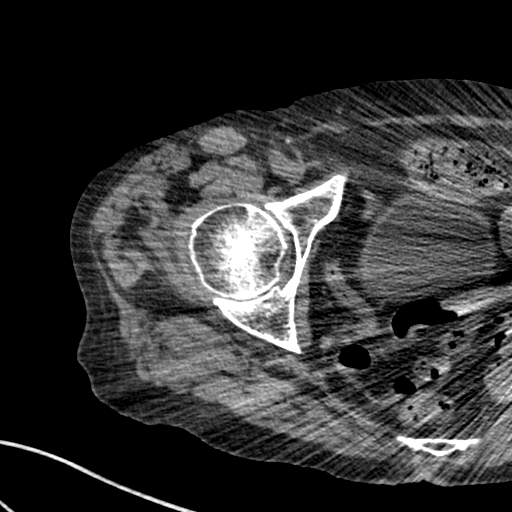
[im 100/124  soft-tissue]
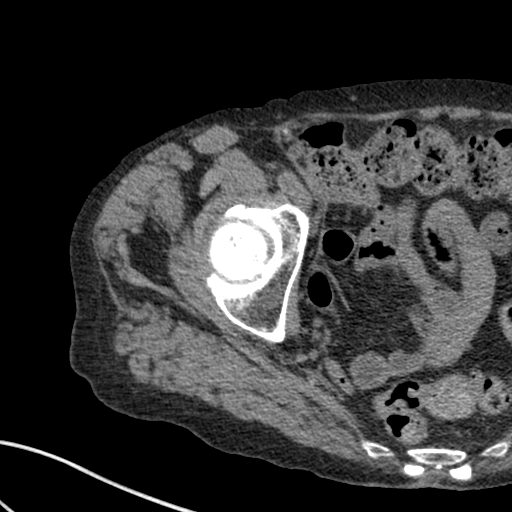
[im 108/124  soft-tissue]
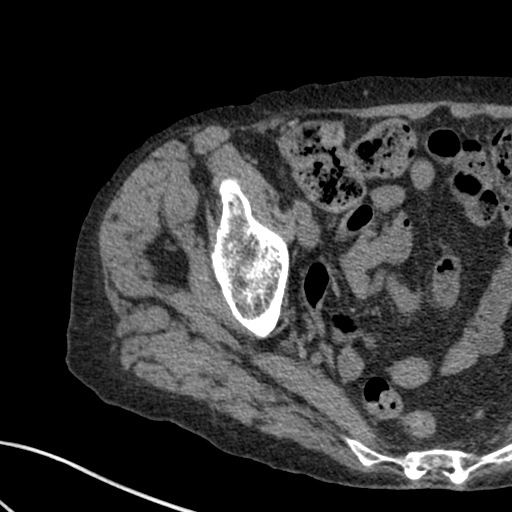
[im 116/124  soft-tissue]
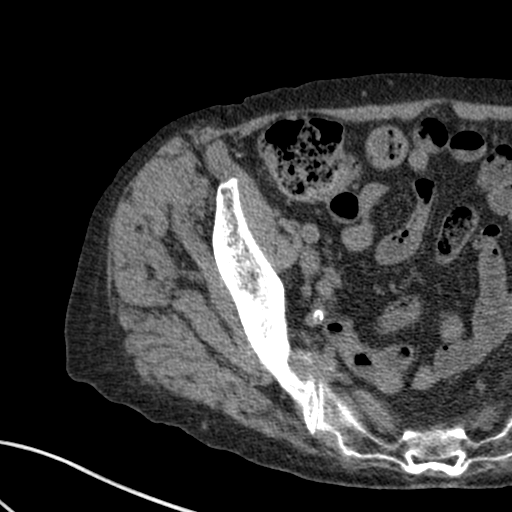

[Series 10: coronal st · coronal · 0.38mm/px · 3 of 100 slices shown]
[im 34/100  soft-tissue]
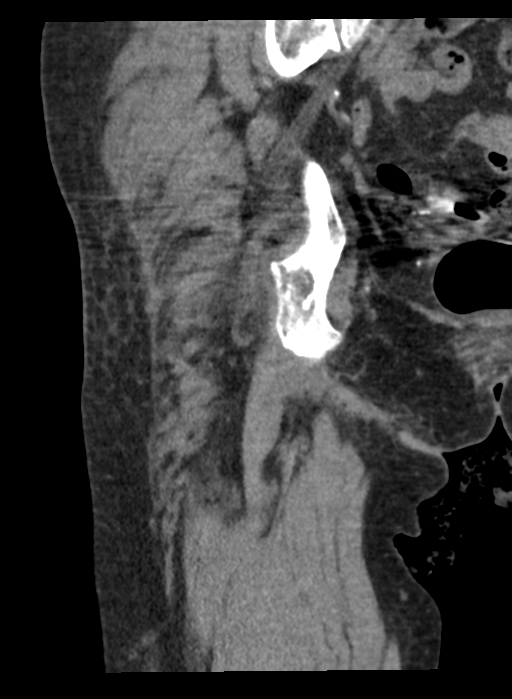
[im 45/100  soft-tissue]
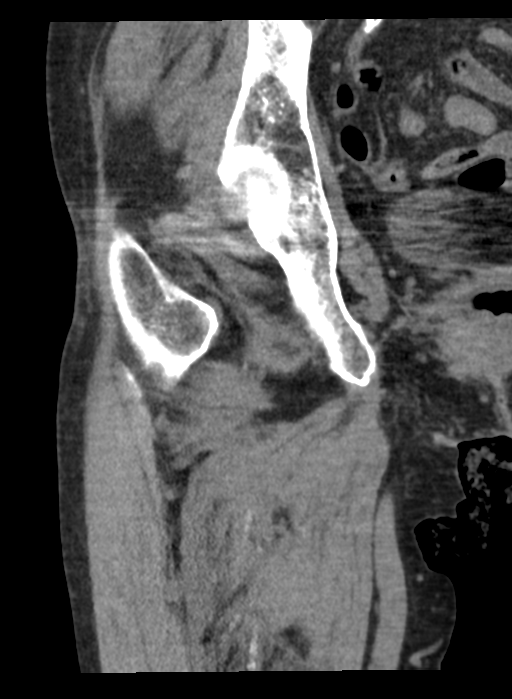
[im 56/100  soft-tissue]
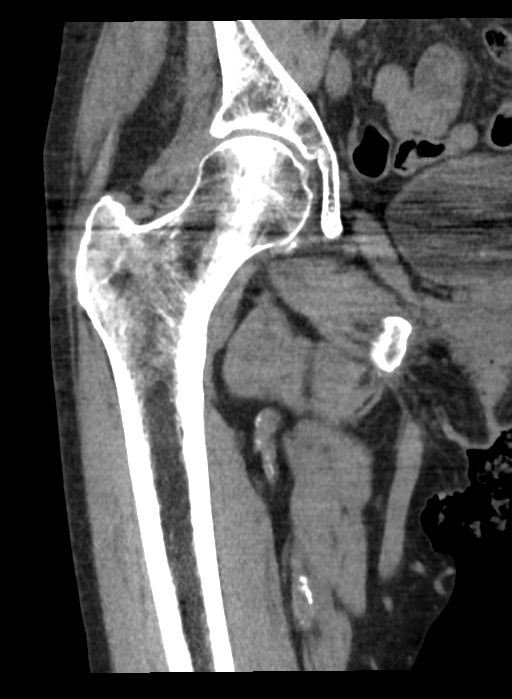

[17 of 46 positions shown; findings below may reference images not displayed]

FINDINGS: Bones/Joint/Cartilage

Diffuse demineralization of bone. No acute hip fracture visualized.
Degenerative change of the right hip. Remote, healed fracture of the
right ischium.

Ligaments

Suboptimally assessed by CT.

Muscles and Tendons

Within normal limits.

Soft tissues

Vascular calcifications. Mild subcutaneous edema over the right hip.
IMPRESSION: 1. No acute hip fracture visualized.
2. Remote, healed fracture of the right ischium.
3. Degenerative change of the right hip.
4. Mild subcutaneous edema over the right hip.

## 2021-02-02 NOTE — ED Notes (Signed)
Daughter informed RN that when pt is an unfamiliar environment pt has often become agitated and restless in the evening environment. Daughter wanted to know how much longer it would be before they can be able to leave to go home. Will speak with primary provider about daughters concerns.

## 2021-02-02 NOTE — ED Provider Notes (Signed)
[facility] EMERGENCY DEPARTMENT Provider Note   CSN: [id number] Arrival date & time: 02/02/21  1348     History Chief Complaint  Patient presents with  . Fall    [name] is a 87 y.o. female.  Patient is a 87 year old female who presents after a fall.  The fall happened 2 days ago.  She was getting out of the shower and slipped and fell.  She thinks she fell forward.  Since that time she has had a hard time walking because of hip pain.  It seems to be more on the right but she cannot really tell me for sure.  It hurts when she puts weight on her legs and hurts when she tries to lift them.  She denies any other injuries.  She is on Plavix but adamantly denies hitting her head.  She denies any neck or back pain.  She has a little bit of a scratch on her left arm but no underlying pain.  She had a history of a prior left hip replacement.  She denies any symptoms preceding the fall such as dizziness, chest pain, shortness of breath or other symptoms.  She feels like her wet foot just slipped.        Past Medical History:  Diagnosis Date  . Coronary artery disease   . DM (diabetes mellitus) ([place])   . HTN (hypertension)   . Hyperlipidemia   . Myocardial infarct ([place]) 2012  . Renal disorder     Patient Active Problem List   Diagnosis Date Noted  . CHF (congestive heart failure) ([place]) 08/11/2020  . CKD (chronic kidney disease), stage III ([place]) 08/11/2020  . Essential hypertension 08/11/2020  . HLD (hyperlipidemia) 08/11/2020  . Type 2 diabetes mellitus without complication ([place]) 11/94/1740  . Memory impairment 08/11/2020  . CAD (coronary artery disease) 08/11/2020  . History of heart artery stent 08/11/2020  . Pleural effusion 03/19/2020    Past Surgical History:  Procedure Laterality Date  . CARDIAC CATHETERIZATION    . CAROTID ENDARTERECTOMY    . CORONARY ANGIOPLASTY    . CORONARY STENT PLACEMENT    . IR THORACENTESIS ASP PLEURAL SPACE W/IMG GUIDE   08/13/2020     OB History   No obstetric history on file.     Family History  Problem Relation Age of Onset  . Alzheimer's disease Mother   . Heart attack Father   . Heart disease Father   . Lymphoma Sister   . Heart failure Brother     Social History   Tobacco Use  . Smoking status: Former Smoker    Packs/day: 1.00    Years: 2.00    Pack years: 2.00    Types: Cigarettes    Quit date: 11/15/1969    Years since quitting: 51.2  . Smokeless tobacco: Never Used  Vaping Use  . Vaping Use: Never used  Substance Use Topics  . Alcohol use: Never  . Drug use: Never    Home Medications Prior to Admission medications   Medication Sig Start Date End Date Taking? Authorizing Provider  Biotin 5000 MCG TABS Take 5,000 mcg by mouth daily.    [provider]  cephALEXin (KEFLEX) 250 MG capsule Take by mouth. 05/23/20   [provider]  Cholecalciferol (VITAMIN D3 SUPER STRENGTH) 50 MCG (2000 UT) TABS Take 2,000 Units by mouth daily.    [provider]  clopidogrel (PLAVIX) 75 MG tablet Take 75 mg by mouth every Monday, Wednesday, and Friday.  [provider]  donepezil (ARICEPT) 10 MG tablet Take 1 tablet (10 mg total) by mouth at bedtime. 07/22/20   Star Age, MD  furosemide (LASIX) 20 MG tablet Take 1 tablet (20 mg total) by mouth daily as needed (weight gain 2-3 lbs.). 12/06/20   O'NealCassie Freer, MD  levothyroxine (SYNTHROID) 25 MCG tablet Take 25 mcg by mouth daily before breakfast.    [provider]  Lidocaine (HM LIDOCAINE PATCH) 4 % PTCH Apply 1 application topically daily. Cut to size and apply over painful hours for up to 12 hours, do not wear for more than 12 hours at a time 11/27/20   Criselda Peaches, DPM  losartan (COZAAR) 100 MG tablet Take 100 mg by mouth daily. 09/11/20   [provider]  metoprolol succinate (TOPROL-XL) 50 MG 24 hr tablet Take 50 mg by mouth daily. 09/11/20   [provider]   Molnupiravir 200 MG CAPS Take 4 capsules by mouth every 12 hours for 5 days 01/09/21     pioglitazone (ACTOS) 15 MG tablet Take 15 mg by mouth daily. 09/11/20   [provider]  rosuvastatin (CRESTOR) 40 MG tablet Take 40 mg by mouth at bedtime.     [provider]  sertraline (ZOLOFT) 100 MG tablet Take 100 mg by mouth at bedtime.    [provider]    Allergies    Tape, Lisinopril, and Nitrofuran derivatives  Review of Systems   Review of Systems  Constitutional: Negative for chills, diaphoresis, fatigue and fever.  HENT: Negative for congestion, rhinorrhea and sneezing.   Eyes: Negative.   Respiratory: Negative for cough, chest tightness and shortness of breath.   Cardiovascular: Negative for chest pain and leg swelling.  Gastrointestinal: Negative for abdominal pain, blood in stool, diarrhea, nausea and vomiting.  Genitourinary: Negative for difficulty urinating, flank pain, frequency and hematuria.  Musculoskeletal: Positive for arthralgias. Negative for back pain.  Skin: Positive for wound. Negative for rash.  Neurological: Negative for dizziness, speech difficulty, weakness, numbness and headaches.    Physical Exam Updated Vital Signs BP (!) 154/71   Pulse 67   Temp 98.2 F (36.8 C) (Oral)   Resp 18   Ht 5' 7.5" (1.715 m)   Wt 49.4 kg   SpO2 98%   BMI 16.82 kg/m   Physical Exam Constitutional:      Appearance: She is well-developed.  HENT:     Head: Normocephalic and atraumatic.  Eyes:     Pupils: Pupils are equal, round, and reactive to light.  Neck:     Comments: No pain along the cervical, thoracic or lumbosacral spine Cardiovascular:     Rate and Rhythm: Normal rate and regular rhythm.     Heart sounds: Normal heart sounds.  Pulmonary:     Effort: Pulmonary effort is normal. No respiratory distress.     Breath sounds: Normal breath sounds. No wheezing or rales.  Chest:     Chest wall: No tenderness.  Abdominal:     General:  Bowel sounds are normal.     Palpations: Abdomen is soft.     Tenderness: There is no abdominal tenderness. There is no guarding or rebound.  Musculoskeletal:        General: Normal range of motion.     Cervical back: Normal range of motion and neck supple.     Comments: She has some mild pain on range of motion of the right hip.  She also has pain on flexion  of the hip.  She has some minimal pain on range of motion of the left hip but is able to lift it against gravity without significant discomfort.  She has no pain to the knees or ankles.  No other pain on palpation or range of motion of the extremities.  Pedal pulses are intact.  She has normal sensation and motor function distally.  Lymphadenopathy:     Cervical: No cervical adenopathy.  Skin:    General: Skin is warm and dry.     Findings: No rash.     Comments: Small skin tear to her left arm, Steri-Strips in place  Neurological:     Mental Status: She is alert and oriented to person, place, and time.     ED Results / Procedures / Treatments   Labs (all labs ordered are listed, but only abnormal results are displayed) Labs Reviewed - No data to display  EKG None  Radiology DG HIPS BILAT WITH PELVIS MIN 5 VIEWS  Result Date: 02/02/2021 CLINICAL DATA:  Pain following fall EXAM: DG HIP (WITH OR WITHOUT PELVIS) 5+V BILAT COMPARISON:  None. FINDINGS: Frontal pelvis as well as frontal and lateral views of each hip joint obtained. There is a total hip replacement on the left with prosthetic components well-seated. There is evidence of an old fracture of the right ischium with remodeling. No acute fracture or dislocation. Slight narrowing right hip joint. There is degenerative change in lower lumbar spine. IMPRESSION: Total hip replacement on the left with prosthetic components well-seated. Old healed fracture right ischium. No acute fracture or dislocation. Mild narrowing right hip joint. Degenerative change in lower lumbar spine.  Electronically Signed   By: Lowella Grip III M.D.   On: 02/02/2021 17:47    Procedures Procedures   Medications Ordered in ED Medications - No data to display  ED Course  I have reviewed the triage vital signs and the nursing notes.  Pertinent labs & imaging results that were available during my care of the patient were reviewed by me and considered in my medical decision making (see chart for details).    MDM Rules/Calculators/A&P                          Patient is a 87 year old female who presents with hip pain.  It seems to be mostly emanating from the right hip.  She does not have any other apparent injuries.  This is been going on for 2 days since a fall.  She had x-rays of the hip which revealed no evidence of acute bony injury.  This was reviewed by me.  She does not have any tenderness to the lumbar spine.  Given her inability to put weight on the hip, I ordered a CT of the hip.  This has been done but it is taking a while to be read.  Patient is agitated and is here with the daughter.  They have decided that they do not want a wait anymore and are leaving.  I did advise them if she does have an occult fractures it will need to have surgery.  My overall concern is that she cannot put weight on it which could be consistent with an occult fracture.  They are aware of this and want to leave..  Patient left AMA. Final Clinical Impression(s) / ED Diagnoses Final diagnoses:  Right hip pain    Rx / DC Orders ED Discharge Orders    None  Malvin Johns, MD 02/02/21 2038

## 2021-02-02 NOTE — ED Triage Notes (Signed)
Pt c/o slip and fall in bathroom 2 days ago. Pt reports unable to walk due to pain in B/L hip. Pt lives at home with family but was home alone at time of fall.

## 2021-02-02 NOTE — ED Notes (Signed)
Awaiting radiology results, VS updated, pt rounded to ensure comfort

## 2021-02-02 NOTE — ED Notes (Signed)
ED Provider at bedside. 

## 2021-02-02 NOTE — ED Notes (Signed)
Assisted PT daughter with peri care. PT tolerated well.

## 2021-02-02 NOTE — ED Notes (Addendum)
States she fell at home 2 days ago, slipped exiting the shower, fell onto rt hip area. Pt states she is not having pain, it is "a little sore", states it is difficult to place weight on rt foot. no discoloration at rt hip noted, is not tender to palpation, no deformity, shortening of rt leg is noted as well. Has strong 2+ rt post tib pulse with good capillary refill. Has strong dorsal and plantar flexion.

## 2021-02-02 NOTE — ED Notes (Signed)
Pt and family member wanted to speak to primary RN about possibly leaving. Primary RN explained that we are down 2 radiologists, so scans have been behind on being read. Xray has been read but not CT. Informed Dr. Tamera Punt that pt & family member wanted to leave and to talk to MD. Dr. Tamera Punt went to talk shortly after being informed; told primary RN they are deciding what they want to do. Family member came out and informed nurses station that they were going to leave. Pt in NAD upon departure of ER in wheelchair; VS have been stable during visit. AMA form not signed d/t brisk departure of pt and family member.

## 2021-02-06 ENCOUNTER — Other Ambulatory Visit: Payer: Self-pay | Admitting: Student

## 2021-02-06 DIAGNOSIS — M25551 Pain in right hip: Secondary | ICD-10-CM

## 2021-02-08 ENCOUNTER — Other Ambulatory Visit: Payer: Self-pay | Admitting: Student

## 2021-02-08 ENCOUNTER — Ambulatory Visit
Admission: RE | Admit: 2021-02-08 | Discharge: 2021-02-08 | Disposition: A | Discharge status: Home / Self Care | Payer: Medicare Other | Source: Ambulatory Visit | Attending: Student | Admitting: Student

## 2021-02-08 ENCOUNTER — Other Ambulatory Visit: Payer: Self-pay

## 2021-02-08 DIAGNOSIS — M25551 Pain in right hip: Secondary | ICD-10-CM

## 2021-02-08 IMAGING — MR MR HIP*R* W/O CM
4 of 5 series · 22 of 40 positions shown · non-contrast
Comparison: X-ray and CT [DATE]

CLINICAL DATA: Right hip and groin pain.  Fall 1 week ago

EXAM:
MR OF THE RIGHT HIP WITHOUT CONTRAST
TECHNIQUE: Multiplanar, multisequence MR imaging was performed. No intravenous
contrast was administered.

[Series 4: T1 · coronal · 4.0mm · 0.74mm/px · 6 of 18 slices shown]
[im 1/18]
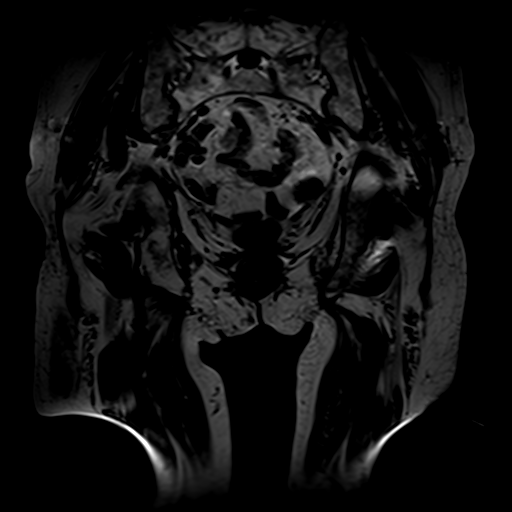
[im 4/18]
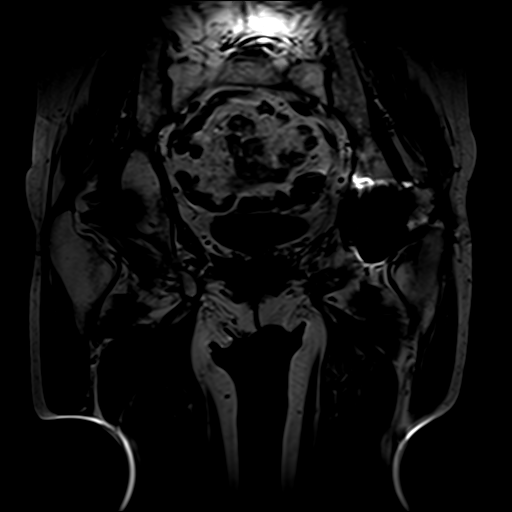
[im 7/18]
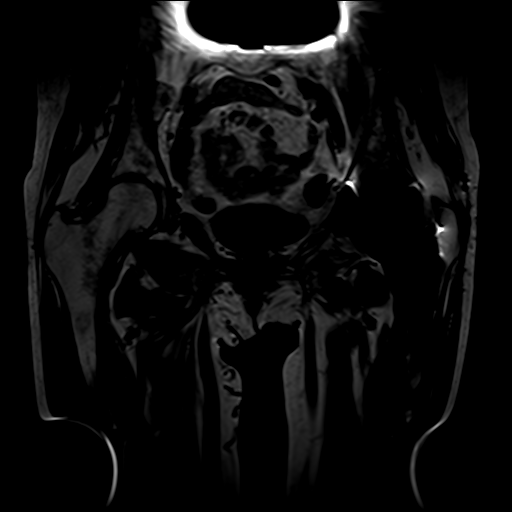
[im 11/18]
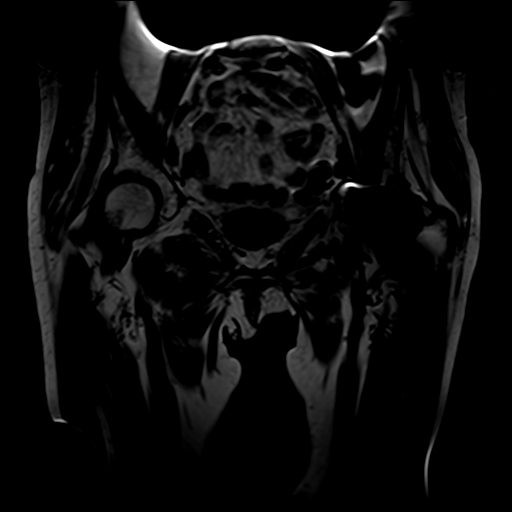
[im 14/18]
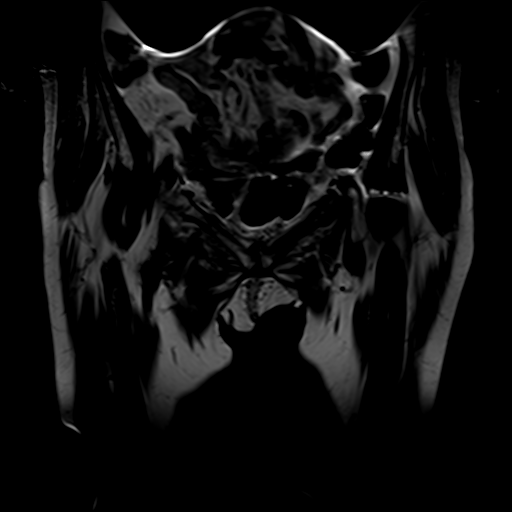
[im 18/18]
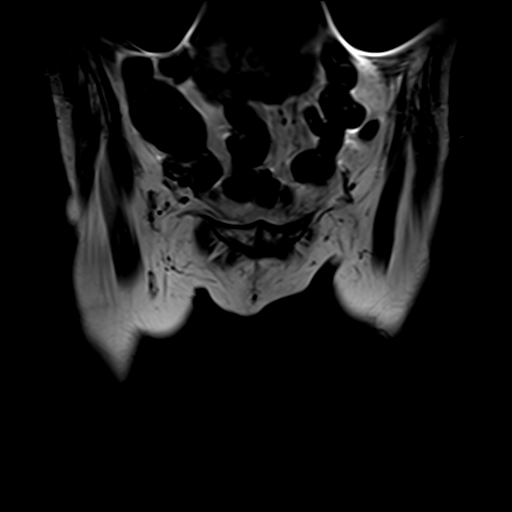

[Series 5: T2 fat-sat · coronal · 4.0mm · 0.74mm/px · 8 of 24 slices shown (1 of 2)]
[im 1/24]
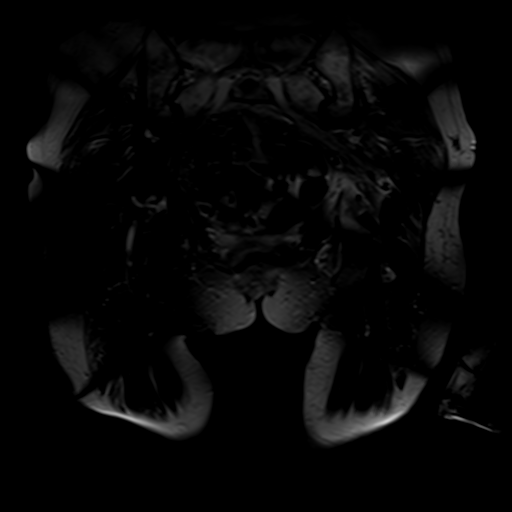
[im 4/24]
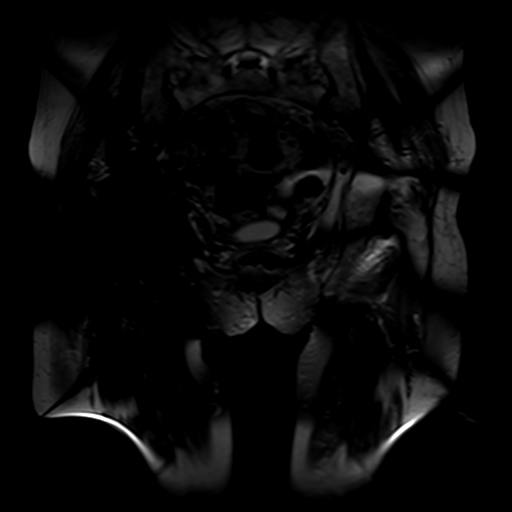
[im 7/24]
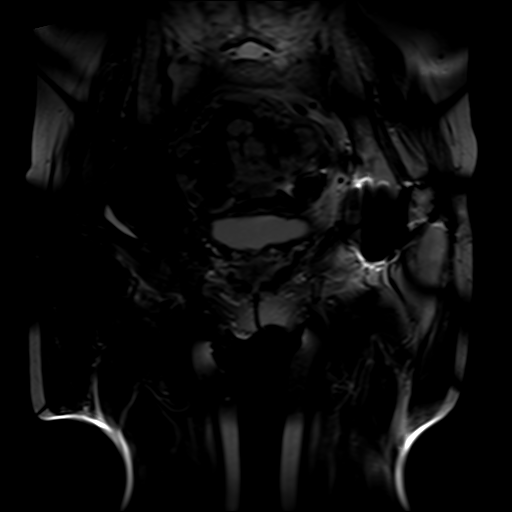
[im 10/24]
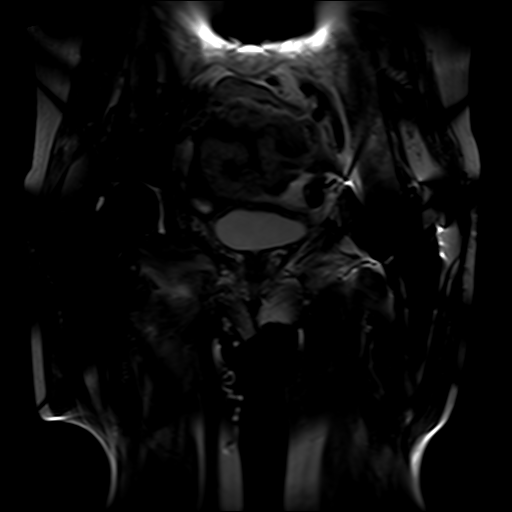
[im 14/24]
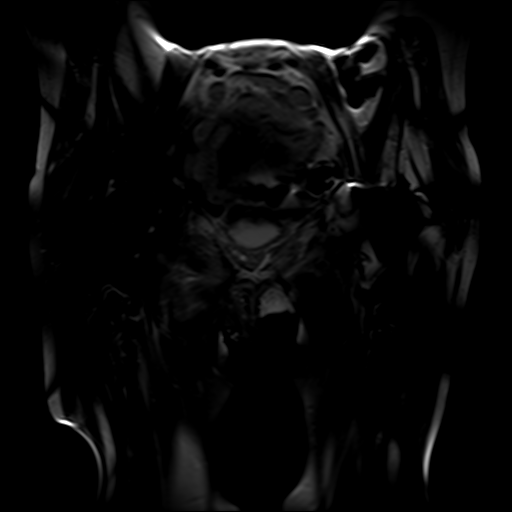
[im 17/24]
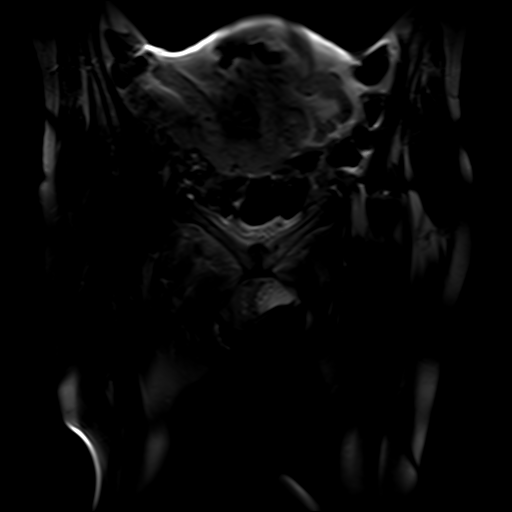
[im 20/24]
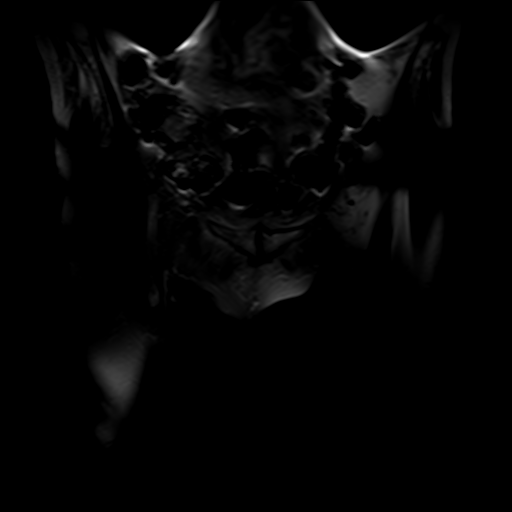
[im 24/24]
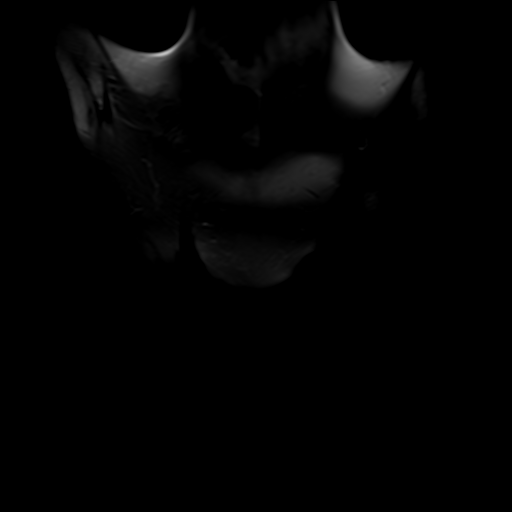

[Series 6: T2 fat-sat · axial · 4.0mm · 0.70mm/px · z∈[-91,+49]mm · 5 of 33 slices shown (2 of 2)]
[im 1/33]
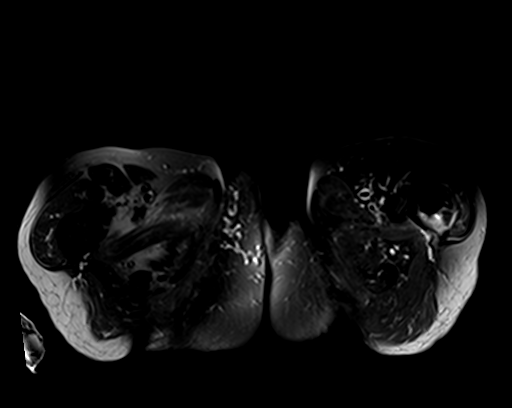
[im 4/33]
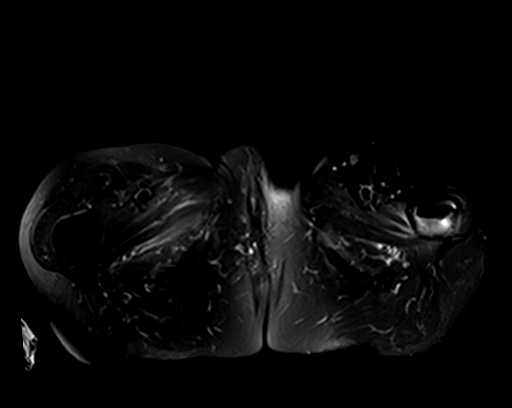
[im 7/33]
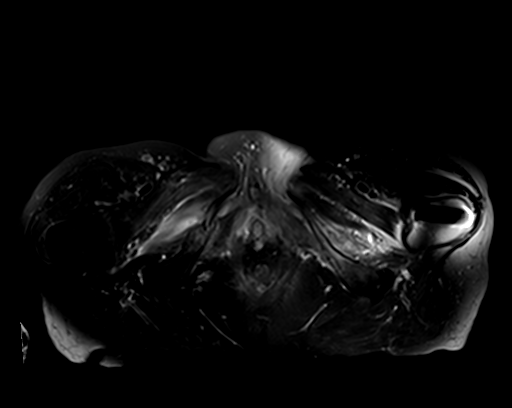
[im 17/33]
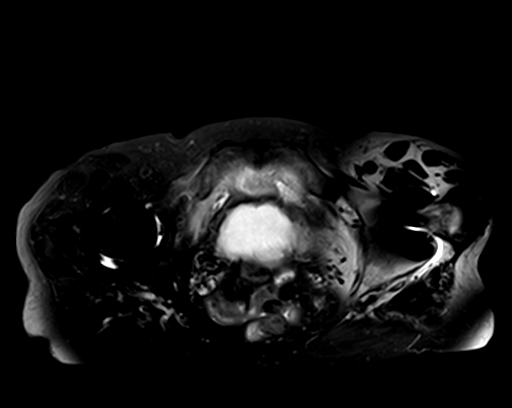
[im 29/33]
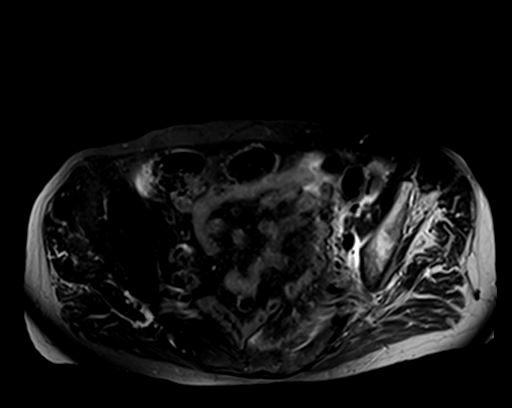

[Series 7: PD fat-sat · sagittal · 4.0mm · 0.70mm/px · 3 of 22 slices shown]
[im 4/22]
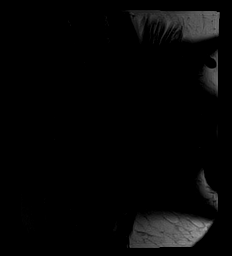
[im 13/22]
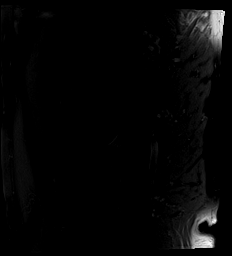
[im 19/22]
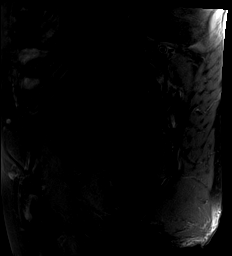

[22 of 40 positions shown; findings below may reference images not displayed]

FINDINGS: Bones: Acute minimally displaced fractures of the bilateral superior
and inferior pubic rami (series 6, images 15-29) with associated
bone marrow edema. Fracture involves the parasymphyseal aspect of
the left pubic bone. Pubic symphysis is intact without diastasis. No
acute fracture or dislocation of the right hip. No femoral head
avascular necrosis. Status post left total hip arthroplasty.
Susceptibility artifact degrades evaluation of the adjacent
structures. Visualized bilateral SI joints are intact without
diastasis. Mild degenerative changes of both SI joints.

Articular cartilage and labrum

Articular cartilage:  Mild chondral thinning without focal defect.

Labrum: Superior labral degeneration with suspected partial tear of
the anterosuperior labrum (series 7, image 14). No paralabral cyst.

Joint or bursal effusion

Joint effusion:  Trace right hip joint effusion.

Bursae: No abnormal bursal fluid collection.

Muscles and tendons

Muscles and tendons: Tendinosis of the bilateral hamstring tendon
origins, right worse than left. Remaining tendinous structures about
the right hip appear intact. Prominent intramuscular edema within
the bilateral adductor muscle compartments, likely reactive.

Other findings

Miscellaneous:   No fluid collections within the soft tissues.
IMPRESSION: 1. Acute fractures of the bilateral superior and inferior pubic rami
with surrounding bone marrow edema.
2. Mild degenerative changes of the right hip without fracture or
dislocation. Trace right hip joint effusion, nonspecific.
3. Tendinosis of the bilateral hamstring tendon origins, right worse
than left.

These results will be called to the ordering clinician or
representative by the Radiologist Assistant, and communication
documented in the PACS or [REDACTED].

## 2021-02-21 ENCOUNTER — Other Ambulatory Visit: Payer: Self-pay | Admitting: Internal Medicine

## 2021-02-21 DIAGNOSIS — Z13820 Encounter for screening for osteoporosis: Secondary | ICD-10-CM

## 2021-06-07 ENCOUNTER — Other Ambulatory Visit: Payer: Self-pay

## 2021-06-07 ENCOUNTER — Emergency Department (HOSPITAL_BASED_OUTPATIENT_CLINIC_OR_DEPARTMENT_OTHER)
Admission: EM | Admit: 2021-06-07 | Discharge: 2021-06-07 | Disposition: A | Discharge status: Home / Self Care | Payer: Medicare Other | Attending: Emergency Medicine | Admitting: Emergency Medicine

## 2021-06-07 ENCOUNTER — Encounter (HOSPITAL_BASED_OUTPATIENT_CLINIC_OR_DEPARTMENT_OTHER): Payer: Self-pay

## 2021-06-07 DIAGNOSIS — S51811A Laceration without foreign body of right forearm, initial encounter: Secondary | ICD-10-CM | POA: Insufficient documentation

## 2021-06-07 DIAGNOSIS — I509 Heart failure, unspecified: Secondary | ICD-10-CM | POA: Insufficient documentation

## 2021-06-07 DIAGNOSIS — Z87891 Personal history of nicotine dependence: Secondary | ICD-10-CM | POA: Insufficient documentation

## 2021-06-07 DIAGNOSIS — Y92013 Bedroom of single-family (private) house as the place of occurrence of the external cause: Secondary | ICD-10-CM | POA: Diagnosis not present

## 2021-06-07 DIAGNOSIS — S51812A Laceration without foreign body of left forearm, initial encounter: Secondary | ICD-10-CM | POA: Insufficient documentation

## 2021-06-07 DIAGNOSIS — N183 Chronic kidney disease, stage 3 unspecified: Secondary | ICD-10-CM | POA: Insufficient documentation

## 2021-06-07 DIAGNOSIS — I2510 Atherosclerotic heart disease of native coronary artery without angina pectoris: Secondary | ICD-10-CM | POA: Diagnosis not present

## 2021-06-07 DIAGNOSIS — W010XXA Fall on same level from slipping, tripping and stumbling without subsequent striking against object, initial encounter: Secondary | ICD-10-CM | POA: Insufficient documentation

## 2021-06-07 DIAGNOSIS — E1122 Type 2 diabetes mellitus with diabetic chronic kidney disease: Secondary | ICD-10-CM | POA: Diagnosis not present

## 2021-06-07 DIAGNOSIS — Z79899 Other long term (current) drug therapy: Secondary | ICD-10-CM | POA: Diagnosis not present

## 2021-06-07 DIAGNOSIS — S81812A Laceration without foreign body, left lower leg, initial encounter: Secondary | ICD-10-CM | POA: Diagnosis not present

## 2021-06-07 DIAGNOSIS — T148XXA Other injury of unspecified body region, initial encounter: Secondary | ICD-10-CM

## 2021-06-07 DIAGNOSIS — S81811A Laceration without foreign body, right lower leg, initial encounter: Secondary | ICD-10-CM | POA: Insufficient documentation

## 2021-06-07 DIAGNOSIS — Z7902 Long term (current) use of antithrombotics/antiplatelets: Secondary | ICD-10-CM | POA: Diagnosis not present

## 2021-06-07 DIAGNOSIS — I130 Hypertensive heart and chronic kidney disease with heart failure and stage 1 through stage 4 chronic kidney disease, or unspecified chronic kidney disease: Secondary | ICD-10-CM | POA: Diagnosis not present

## 2021-06-07 NOTE — Discharge Instructions (Addendum)
Follow-up next week with your primary care doctor for wound check.  Recommend daily dressing changes.  Use nonadherent gauze.  Come back to ER if you develop redness, swelling, discharge.

## 2021-06-07 NOTE — ED Triage Notes (Signed)
She states she tripped (didn't pass out) in her bedroom at home. She has a skin tear at each upper shin areas; and also has a skin tear at each proximal, lateral forearm. She is in no distress and her daughter is with her.

## 2021-06-08 NOTE — ED Provider Notes (Signed)
[facility] EMERGENCY DEPT Provider Note   CSN: [id number] Arrival date & time: 06/07/21  1153     History Chief Complaint  Patient presents with   Fall   skin tears    [name] is a 87 y.o. female.  Presents to the emergency room with concern for fall, skin tears.  Patient states that she tripped in her bedroom at home, did not pass out, did not feel lightheaded.  [address] and suffered skin tears to her lower legs and forearms.  Did not hit her head.  Has been ambulatory without difficulty since the fall.  Per review of medications in chart, on Plavix.  HPI     Past Medical History:  Diagnosis Date   Coronary artery disease    DM (diabetes mellitus) ([place])    HTN (hypertension)    Hyperlipidemia    Myocardial infarct [facility]) 2012   Renal disorder     Patient Active Problem List   Diagnosis Date Noted   CHF (congestive heart failure) ([place]) 08/11/2020   CKD (chronic kidney disease), stage III ([place]) 08/11/2020   Essential hypertension 08/11/2020   HLD (hyperlipidemia) 08/11/2020   Type 2 diabetes mellitus without complication ([place]) [id number]   Memory impairment 08/11/2020   CAD (coronary artery disease) 08/11/2020   History of heart artery stent 08/11/2020   Pleural effusion 03/19/2020    Past Surgical History:  Procedure Laterality Date   CARDIAC CATHETERIZATION     CAROTID ENDARTERECTOMY     CORONARY ANGIOPLASTY     CORONARY STENT PLACEMENT     IR THORACENTESIS ASP PLEURAL SPACE W/IMG GUIDE  08/13/2020     OB History   No obstetric history on file.     Family History  Problem Relation Age of Onset   Alzheimer's disease Mother    Heart attack Father    Heart disease Father    Lymphoma Sister    Heart failure Brother     Social History   Tobacco Use   Smoking status: Former    Packs/day: 1.00    Years: 2.00    Pack years: 2.00    Types: Cigarettes    Quit date: 11/15/1969    Years since quitting: 51.5   Smokeless tobacco: Never   Vaping Use   Vaping Use: Never used  Substance Use Topics   Alcohol use: Never   Drug use: Never    Home Medications Prior to Admission medications   Medication Sig Start Date End Date Taking? Authorizing Provider  Biotin 5000 MCG TABS Take 5,000 mcg by mouth daily.    [provider]  cephALEXin (KEFLEX) 250 MG capsule Take by mouth. 05/23/20   [provider]  Cholecalciferol (VITAMIN D3 SUPER STRENGTH) 50 MCG (2000 UT) TABS Take 2,000 Units by mouth daily.    [provider]  clopidogrel (PLAVIX) 75 MG tablet Take 75 mg by mouth every Monday, Wednesday, and Friday.     [provider]  donepezil (ARICEPT) 10 MG tablet Take 1 tablet (10 mg total) by mouth at bedtime. 07/22/20   Star Age, MD  furosemide (LASIX) 20 MG tablet Take 1 tablet (20 mg total) by mouth daily as needed (weight gain 2-3 lbs.). 12/06/20   O'NealCassie Freer, MD  levothyroxine (SYNTHROID) 25 MCG tablet Take 25 mcg by mouth daily before breakfast.    [provider]  Lidocaine (HM LIDOCAINE PATCH) 4 % PTCH Apply 1 application topically daily. Cut to size and apply over painful hours for up  to 12 hours, do not wear for more than 12 hours at a time 11/27/20   Criselda Peaches, DPM  losartan (COZAAR) 100 MG tablet Take 100 mg by mouth daily. 09/11/20   [provider]  metoprolol succinate (TOPROL-XL) 50 MG 24 hr tablet Take 50 mg by mouth daily. 09/11/20   [provider]  Molnupiravir 200 MG CAPS Take 4 capsules by mouth every 12 hours for 5 days 01/09/21     pioglitazone (ACTOS) 15 MG tablet Take 15 mg by mouth daily. 09/11/20   [provider]  rosuvastatin (CRESTOR) 40 MG tablet Take 40 mg by mouth at bedtime.     [provider]  sertraline (ZOLOFT) 100 MG tablet Take 100 mg by mouth at bedtime.    [provider]    Allergies    Tape, Lisinopril, and Nitrofuran derivatives  Review of Systems   Review of Systems   Constitutional:  Negative for chills and fever.  HENT:  Negative for ear pain and sore throat.   Eyes:  Negative for pain and visual disturbance.  Respiratory:  Negative for cough and shortness of breath.   Cardiovascular:  Negative for chest pain and palpitations.  Gastrointestinal:  Negative for abdominal pain and vomiting.  Genitourinary:  Negative for dysuria and hematuria.  Musculoskeletal:  Negative for arthralgias and back pain.  Skin:  Positive for wound. Negative for color change and rash.  Neurological:  Negative for seizures and syncope.  All other systems reviewed and are negative.  Physical Exam Updated Vital Signs BP (!) 124/58 (BP Location: Left Arm)   Pulse 70   Temp 98.9 F (37.2 C) (Oral)   Resp 16   Ht '5\' 7"'$  (1.702 m)   Wt 49 kg   SpO2 98%   BMI 16.92 kg/m   Physical Exam Vitals and nursing note reviewed.  Constitutional:      General: She is not in acute distress.    Appearance: She is well-developed.  HENT:     Head: Normocephalic and atraumatic.  Eyes:     Conjunctiva/sclera: Conjunctivae normal.  Cardiovascular:     Rate and Rhythm: Normal rate and regular rhythm.     Heart sounds: No murmur heard. Pulmonary:     Effort: Pulmonary effort is normal. No respiratory distress.     Breath sounds: Normal breath sounds.  Abdominal:     Palpations: Abdomen is soft.     Tenderness: There is no abdominal tenderness.  Musculoskeletal:     Cervical back: Neck supple.     Comments: Back: no C, T, L spine TTP, no step off or deformity RUE: superficial skin tear to lateral proximal forearm, normal joint ROM, no focal bony TTP, distal pulse, sensation and motor intact LUE: superficial skin tear to lateral proximal forearm, normal joint ROM, no focal bony TTP, distal pulse, sensation and motor intact RLE:  Superficial skin tear to anterior lower leg, no active bleeding, no focal bony TTP throughout, normal joint ROM, distal pulse, sensation and motor  intact LLE: Superficial skin tear to anterior lower leg, no active bleeding, no focal bony TTP throughout, normal joint ROM, distal pulse, sensation and motor intact  Skin:    General: Skin is warm and dry.     Comments: See above  Neurological:     General: No focal deficit present.     Mental Status: She is alert.    ED Results / Procedures / Treatments   Labs (all labs ordered are  listed, but only abnormal results are displayed) Labs Reviewed - No data to display  EKG None  Radiology No results found.  Procedures Procedures   Medications Ordered in ED Medications - No data to display  ED Course  I have reviewed the triage vital signs and the nursing notes.  Pertinent labs & imaging results that were available during my care of the patient were reviewed by me and considered in my medical decision making (see chart for details).    MDM Rules/Calculators/A&P                           87 year old lady presented to ER.  Mechanical, no LOC.  No head trauma by history and no evidence of trauma on exam today.  On extremity assessment, did note superficial skin tear to bilateral proximal lower legs and proximal forearms.  She was ambulatory, no focal bony tenderness was appreciated normal joint ROM, doubt fracture, dislocation.  Provided local wound care and patient was discharged home.    After the discussed management above, the patient was determined to be safe for discharge.  The patient was in agreement with this plan and all questions regarding their care were answered.  ED return precautions were discussed and the patient will return to the ED with any significant worsening of condition.  Final Clinical Impression(s) / ED Diagnoses Final diagnoses:  Skin abrasion    Rx / DC Orders ED Discharge Orders     None        Lucrezia Starch, MD 06/08/21 [phone]

## 2021-07-14 ENCOUNTER — Ambulatory Visit (HOSPITAL_COMMUNITY)
Admission: RE | Admit: 2021-07-14 | Discharge: 2021-07-14 | Disposition: A | Discharge status: Home / Self Care | Payer: Medicare Other | Source: Ambulatory Visit | Attending: Cardiology | Admitting: Cardiology

## 2021-07-14 ENCOUNTER — Other Ambulatory Visit: Payer: Self-pay

## 2021-07-14 DIAGNOSIS — I2510 Atherosclerotic heart disease of native coronary artery without angina pectoris: Secondary | ICD-10-CM | POA: Diagnosis present

## 2021-07-14 DIAGNOSIS — I6523 Occlusion and stenosis of bilateral carotid arteries: Secondary | ICD-10-CM | POA: Insufficient documentation

## 2021-08-03 NOTE — Progress Notes (Signed)
Cardiology Office Note:   Date:  08/04/2021  NAME:  [name]    MRN: [id number] DOB:  22-Mar-1932   PCP:  Michael Boston, MD  Cardiologist:  Evalina Field, MD  Electrophysiologist:  None   Referring MD: Michael Boston, MD   Chief Complaint  Patient presents with   Follow-up        History of Present Illness:   [name] is a 87 y.o. female with a hx of HFpEF, CAD, Carotid artery disease, HTN, HLD who presents for follow-up.  She presents with her daughter.  Appears to be developing worsening fatigue and low energy.  Apparently she naps frequently during the day.  2 recent trips to the emergency room on 01/23/2021 and 06/07/2021 for falls.  Reports no red flag symptoms such as chest pain or shortness of breath.  No increased weight gain.  Review of weights actually shows she has lost roughly 10 pounds since her last appointment.  She is eating well but continues to lose weight.  She had a full panel of labs at her primary care physician which shows a new transaminitis.  AST 72 and ALT 118.  Alk phos 136.  Serum creatinine remained stable at 1.56.  She is slightly anemic but nothing appears to be new as this value is 10.8.  TSH 0.78.  She denies any dark or brown stool.  No blood in her stool either.  She has remained on a statin despite his elevated liver enzymes.  She has not been evaluated per the daughter's report.  She has been taken off her blood pressure medications.  BP 140/70 in office today.  And for me she is not checking her blood pressures at home but it has remained within limits when it is checked.  She is taking Lasix as needed.  She has no evidence of volume overload on exam.  No bleeding reported.  Her weight loss is concerning despite her report of good oral intake.  The only abnormality seen recently is her elevated liver enzymes.  She reports no dysuria or hematuria.  No concerns for UTI per the daughter.  Regarding her memory things appear to be status quo.  No overt  changes in energy level low.  Problem List 1. Recurrent L pleural effusion  -s/p pleurex catheter 2.  CAD  -status post stent in 2007/2012  3.  Hypertension 4.  Hyperlipidemia -Total cholesterol 159, HDL 57, LDL 84, triglycerides 92 5.  Arthritis 6.  Esophageal dilation 7.  Carotid artery stenosis status post right CEA -L ICA 40-59% 8. DM -A1c 6.9 9. HFpEF -EF 55-60% -BNP 1055 10. Dementia   Past Medical History: Past Medical History:  Diagnosis Date   Coronary artery disease    DM (diabetes mellitus) ([place])    HTN (hypertension)    Hyperlipidemia    Myocardial infarct ([place]) 2012   Renal disorder     Past Surgical History: Past Surgical History:  Procedure Laterality Date   CARDIAC CATHETERIZATION     CAROTID ENDARTERECTOMY     CORONARY ANGIOPLASTY     CORONARY STENT PLACEMENT     IR THORACENTESIS ASP PLEURAL SPACE W/IMG GUIDE  08/13/2020    Current Medications: Current Meds  Medication Sig   Biotin 5000 MCG TABS Take 5,000 mcg by mouth daily.   Cholecalciferol (VITAMIN D3 SUPER STRENGTH) 50 MCG (2000 UT) TABS Take 2,000 Units by mouth daily.   clopidogrel (PLAVIX) 75 MG tablet Take 75 mg by mouth every  Monday, Wednesday, and Friday.    donepezil (ARICEPT) 10 MG tablet Take 1 tablet (10 mg total) by mouth at bedtime.   furosemide (LASIX) 20 MG tablet Take 1 tablet (20 mg total) by mouth daily as needed (weight gain 2-3 lbs.).   levothyroxine (SYNTHROID) 25 MCG tablet Take 25 mcg by mouth daily before breakfast.   sertraline (ZOLOFT) 100 MG tablet Take 100 mg by mouth at bedtime.   [DISCONTINUED] rosuvastatin (CRESTOR) 40 MG tablet Take 40 mg by mouth at bedtime.      Allergies:    Tape, Lisinopril, and Nitrofuran derivatives   Social History: Social History   Socioeconomic History   Marital status: Widowed    Spouse name: Not on file   Number of children: Not on file   Years of education: Not on file   Highest education level: Not on file   Occupational History   Not on file  Tobacco Use   Smoking status: Former    Packs/day: 1.00    Years: 2.00    Pack years: 2.00    Types: Cigarettes    Quit date: 11/15/1969    Years since quitting: 51.7   Smokeless tobacco: Never  Vaping Use   Vaping Use: Never used  Substance and Sexual Activity   Alcohol use: Never   Drug use: Never   Sexual activity: Not on file  Other Topics Concern   Not on file  Social History Narrative   Not on file   Social Determinants of Health   Financial Resource Strain: Not on file  Food Insecurity: No Food Insecurity   Worried About Running Out of Food in the Last Year: Never true   Pace in the Last Year: Never true  Transportation Needs: No Transportation Needs   Lack of Transportation (Medical): No   Lack of Transportation (Non-Medical): No  Physical Activity: Not on file  Stress: Not on file  Social Connections: Not on file     Family History: The patient's family history includes Alzheimer's disease in her mother; Heart attack in her father; Heart disease in her father; Heart failure in her brother; Lymphoma in her sister.  ROS:   All other ROS reviewed and negative. Pertinent positives noted in the HPI.     EKGs/Labs/Other Studies Reviewed:   The following studies were personally reviewed by me today:  Carotid US 06/2021 Summary:  Right Carotid: Patent internal carotid artery s/p endarterectomy. The                 extracranial vessels were near-normal with only minimal  wall                 thickening or plaque.   Left Carotid: Velocities in the left ICA are consistent with a 40-59%  stenosis.                Non-hemodynamically significant plaque <50% noted in the  CCA.   Vertebrals:  Bilateral vertebral arteries demonstrate antegrade flow.  Subclavians: Normal flow hemodynamics were seen in bilateral subclavian               arteries.   TTE 08/12/2020  1. Left ventricular ejection fraction, by estimation,  is 55 to 60%. The  left ventricle has normal function. The left ventricle has no regional  wall motion abnormalities. There is mild left ventricular hypertrophy.  Left ventricular diastolic parameters  are indeterminate.   2. Right ventricular systolic function is normal. The right ventricular  size is normal. There is moderately elevated pulmonary artery systolic  pressure. The estimated right ventricular systolic pressure is 16.1 mmHg.   3. Trivial to small pericardial effusion. The pericardial effusion is  circumferential. There is no evidence of cardiac tamponade.   4. The mitral valve is abnormal. Mild mitral valve regurgitation.   5. The aortic valve is tricuspid. Aortic valve regurgitation is not  visualized. Mild aortic valve sclerosis is present, with no evidence of  aortic valve stenosis.   6. The inferior vena cava is normal in size with greater than 50%  respiratory variability, suggesting right atrial pressure of 3 mmHg.      Recent Labs: 08/11/2020: ALT 29; B Natriuretic Peptide 1,055.0 08/13/2020: TSH 1.183 08/14/2020: BUN 30; Creatinine, Ser 1.70; Hemoglobin 9.4; Magnesium 2.2; Platelets 331; Potassium 3.3; Sodium 136   Recent Lipid Panel No results found for: CHOL, TRIG, HDL, CHOLHDL, VLDL, LDLCALC, LDLDIRECT  Physical Exam:   VS:  BP 140/70 (BP Location: Left Arm, Patient Position: Sitting, Cuff Size: Normal)   Pulse 70   Resp 20   Ht 5' 7"  (1.702 m)   Wt 101 lb (45.8 kg)   SpO2 98%   BMI 15.82 kg/m    Wt Readings from Last 3 Encounters:  08/04/21 101 lb (45.8 kg)  06/07/21 108 lb (49 kg)  02/02/21 109 lb (49.4 kg)    General: Frail, cachexia noted Head: Atraumatic, normal size  Eyes: PEERLA, EOMI  Neck: Supple, no JVD Endocrine: No thryomegaly Cardiac: Normal S1, S2; RRR; no murmurs, rubs, or gallops Lungs: Clear to auscultation bilaterally, no wheezing, rhonchi or rales  Abd: Soft, nontender, no hepatomegaly  Ext: No edema, pulses  2+ Musculoskeletal: No deformities, BUE and BLE strength normal and equal Skin: Warm and dry, no rashes   Neuro: Alert and oriented to person, place, time, and situation, CNII-XII grossly intact, no focal deficits  Psych: Normal mood and affect   ASSESSMENT:   [name] is a 87 y.o. female who presents for the following: 1. Chronic diastolic heart failure ([place])   2. Coronary artery disease involving native coronary artery of native heart without angina pectoris   3. Bilateral carotid artery stenosis   4. Mixed hyperlipidemia   5. Transaminitis   6. Other fatigue   7. Weight loss     PLAN:   1. Chronic diastolic heart failure ([facility]) -Euvolemic on exam.  Weights are down roughly 10 pound since her last appointment.  Would recommend she continue Lasix as needed.  She is not volume overloaded.  I do not believe her symptoms of fatigue and weight loss are explained by congestive heart failure.  I explained this to her daughter today.  2. Coronary artery disease involving native coronary artery of native heart without angina pectoris -History of PCI in 2007 2012.  She also has carotid artery disease.  Her elevated liver enzymes at her primary care physician are concerning.  I recommended to hold her statin until this is evaluated further.  I have ordered a liver ultrasound.  I also recommended to discuss further evaluation with her primary care physician.  Her weight loss and elevated liver enzymes are concerning.  Possibly there is some malignancy we are dealing with.  She has no symptoms of angina.  I do not believe her CAD explains this.  She is remained on Plavix 75 mg daily without any major bleeding issues.  3. Bilateral carotid artery stenosis -Status post right CEA.  Left ICA 40-59% stenosis.  Continue with yearly carotid ultrasounds.  4. Mixed hyperlipidemia 5. Transaminitis 6. Other fatigue 7. Weight loss -She is off roughly 10 pound since her last appointment.  TSH within  limits. She is eating well per the daughter's report.  No anemia or blood in her stool.  The only abnormality on recent lab work from primary care physician showed elevated liver enzymes.  She reports no abdominal pain.  I recommended she hold her Crestor until this gets sorted out.  We will order right upper quadrant ultrasound.  Given her weight loss and elevated liver enzymes she may ultimately need a CT of her abdomen pelvis.  I discussed this with the daughter.  They will reach out to their primary care physician.  Her congestive heart failure does not appear to explain her symptoms.  I discussed with her daughter that a repeat echocardiogram likely would not yield any change from her prior study.  She clinically has no evidence of volume overload.  I do not believe her ejection fraction is reduced or is changed from her prior study.  Her serum glucose values have been within limits.  From a cardiac standpoint no other real explanation for her symptoms.  She has been taken off her blood pressure medications and BP values are within limits for her.  She has no hypotensive episodes.  She is not maintaining adequate hydration and I recommended this moving forward.  We will see if right upper quadrant ultrasound revealed any pathology.  Again she may need CT abdomen pelvis.  We will forward all results to her primary care physician.  Disposition: Return in about 6 months (around 02/01/2022).  Medication Adjustments/Labs and Tests Ordered: Current medicines are reviewed at length with the patient today.  Concerns regarding medicines are outlined above.  Orders Placed This Encounter  Procedures   US Abdomen Limited RUQ (LIVER/GB)   No orders of the defined types were placed in this encounter.   Patient Instructions  Medication Instructions:  STOP Crestor   *If you need a refill on your cardiac medications before your next appointment, please call your pharmacy*  Testing/Procedures: LIVER US- [place]: At [facility], you and your health needs are our priority.  As part of our continuing mission to provide you with exceptional heart care, we have created designated Provider Care Teams.  These Care Teams include your primary Cardiologist (physician) and Advanced Practice Providers (APPs -  Physician Assistants and Nurse Practitioners) who all work together to provide you with the care you need, when you need it.  We recommend signing up for the patient portal called "MyChart".  Sign up information is provided on this After Visit Summary.  MyChart is used to connect with patients for Virtual Visits (Telemedicine).  Patients are able to view lab/test results, encounter notes, upcoming appointments, etc.  Non-urgent messages can be sent to your provider as well.   To learn more about what you can do with MyChart, go to [email or web address].    Your next appointment:   6 month(s)  The format for your next appointment:   In Person  Provider:   Evalina Field, MD      Time Spent with Patient: I have spent a total of 35 minutes with patient reviewing hospital notes, telemetry, EKGs, labs and examining the patient as well as establishing an assessment and plan that was discussed with the patient.  > 50% of time was spent in direct patient care.  Signed, Addison Naegeli. Audie Box, MD, [facility]  [address], [place], [place] [address] [phone]  08/04/2021 7:28 PM

## 2021-08-04 ENCOUNTER — Ambulatory Visit (INDEPENDENT_AMBULATORY_CARE_PROVIDER_SITE_OTHER): Payer: Medicare Other | Admitting: Cardiovascular Disease

## 2021-08-04 ENCOUNTER — Other Ambulatory Visit: Payer: Self-pay

## 2021-08-04 ENCOUNTER — Other Ambulatory Visit: Payer: Self-pay | Admitting: Neurology

## 2021-08-04 ENCOUNTER — Encounter: Payer: Self-pay | Admitting: Cardiovascular Disease

## 2021-08-04 VITALS — BP 140/70 | HR 70 | Resp 20 | Ht 67.0 in | Wt 101.0 lb

## 2021-08-04 DIAGNOSIS — R5383 Other fatigue: Secondary | ICD-10-CM

## 2021-08-04 DIAGNOSIS — I2510 Atherosclerotic heart disease of native coronary artery without angina pectoris: Secondary | ICD-10-CM | POA: Diagnosis not present

## 2021-08-04 DIAGNOSIS — I6523 Occlusion and stenosis of bilateral carotid arteries: Secondary | ICD-10-CM

## 2021-08-04 DIAGNOSIS — R410 Disorientation, unspecified: Secondary | ICD-10-CM

## 2021-08-04 DIAGNOSIS — E782 Mixed hyperlipidemia: Secondary | ICD-10-CM | POA: Diagnosis not present

## 2021-08-04 DIAGNOSIS — R634 Abnormal weight loss: Secondary | ICD-10-CM

## 2021-08-04 DIAGNOSIS — I5032 Chronic diastolic (congestive) heart failure: Secondary | ICD-10-CM | POA: Diagnosis not present

## 2021-08-04 DIAGNOSIS — Z818 Family history of other mental and behavioral disorders: Secondary | ICD-10-CM

## 2021-08-04 DIAGNOSIS — R7401 Elevation of levels of liver transaminase levels: Secondary | ICD-10-CM

## 2021-08-04 DIAGNOSIS — F0390 Unspecified dementia without behavioral disturbance: Secondary | ICD-10-CM

## 2021-08-04 NOTE — Patient Instructions (Addendum)
Medication Instructions:  STOP Crestor   *If you need a refill on your cardiac medications before your next appointment, please call your pharmacy*  Testing/Procedures: LIVER US- [place]: At [facility], you and your health needs are our priority.  As part of our continuing mission to provide you with exceptional heart care, we have created designated Provider Care Teams.  These Care Teams include your primary Cardiologist (physician) and Advanced Practice Providers (APPs -  Physician Assistants and Nurse Practitioners) who all work together to provide you with the care you need, when you need it.  We recommend signing up for the patient portal called "MyChart".  Sign up information is provided on this After Visit Summary.  MyChart is used to connect with patients for Virtual Visits (Telemedicine).  Patients are able to view lab/test results, encounter notes, upcoming appointments, etc.  Non-urgent messages can be sent to your provider as well.   To learn more about what you can do with MyChart, go to [email or web address].    Your next appointment:   6 month(s)  The format for your next appointment:   In Person  Provider:   Evalina Field, MD

## 2021-08-06 ENCOUNTER — Telehealth: Payer: Self-pay | Admitting: Neurology

## 2021-08-06 ENCOUNTER — Ambulatory Visit
Admission: RE | Admit: 2021-08-06 | Discharge: 2021-08-06 | Disposition: A | Discharge status: Home / Self Care | Payer: Medicare Other | Source: Ambulatory Visit | Attending: Cardiovascular Disease | Admitting: Cardiovascular Disease

## 2021-08-06 DIAGNOSIS — Z818 Family history of other mental and behavioral disorders: Secondary | ICD-10-CM

## 2021-08-06 DIAGNOSIS — R7401 Elevation of levels of liver transaminase levels: Secondary | ICD-10-CM

## 2021-08-06 DIAGNOSIS — F0390 Unspecified dementia without behavioral disturbance: Secondary | ICD-10-CM

## 2021-08-06 DIAGNOSIS — R410 Disorientation, unspecified: Secondary | ICD-10-CM

## 2021-08-06 IMAGING — US US ABDOMEN LIMITED
1 series · 14 of 25 positions shown · non-contrast
Comparison: None.

CLINICAL DATA: Transaminitis.

EXAM:
ULTRASOUND ABDOMEN LIMITED RIGHT UPPER QUADRANT

[Series 1: us abdomen limited · 0.15mm/px · 14 of 50 slices shown]
[im 1/50]
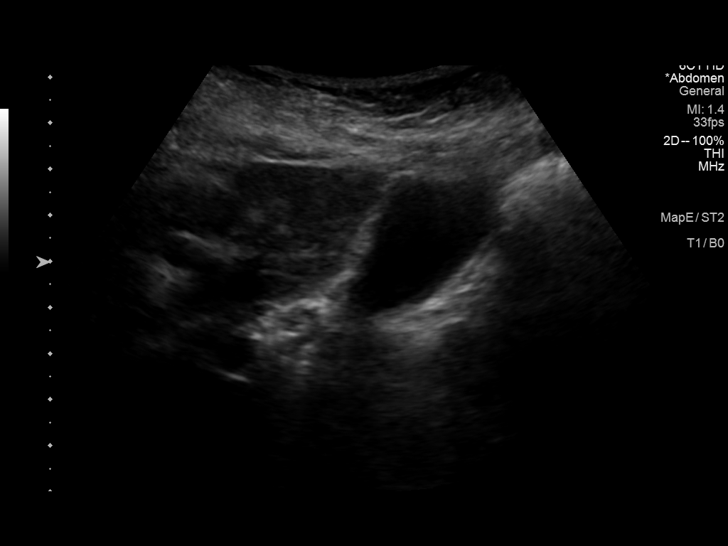
[im 5/50]
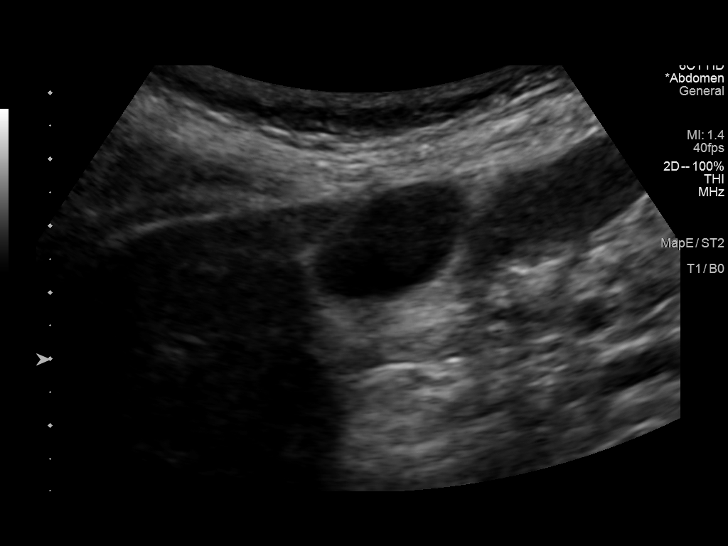
[im 9/50]
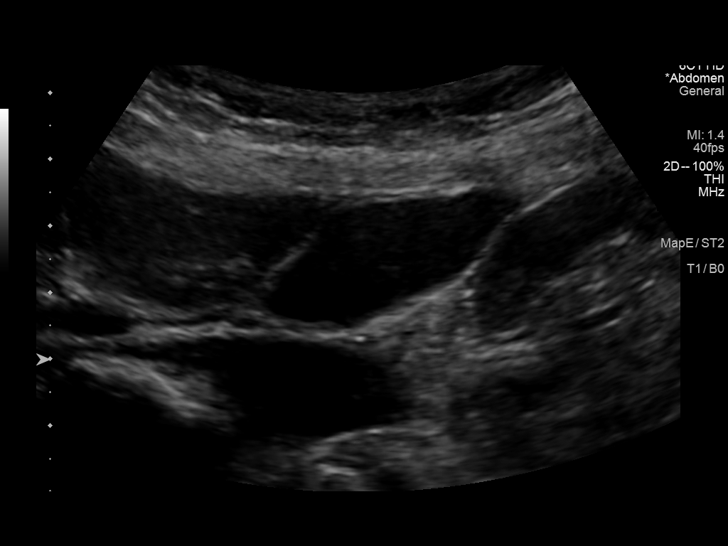
[im 13/50]
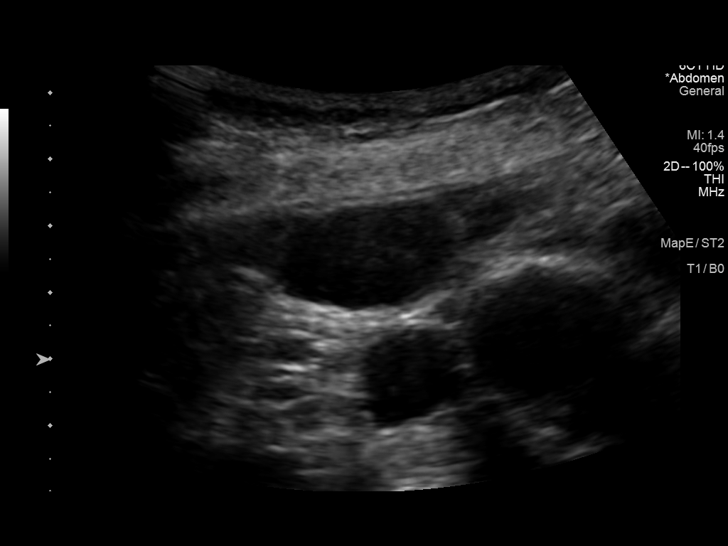
[im 17/50]
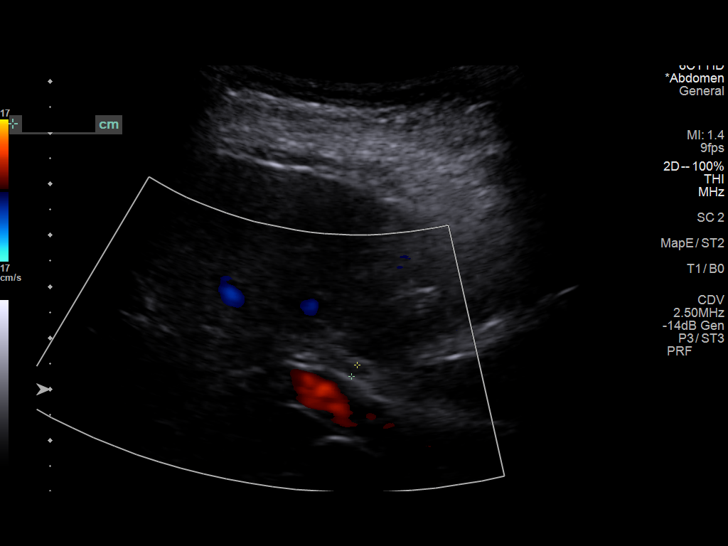
[im 19/50]
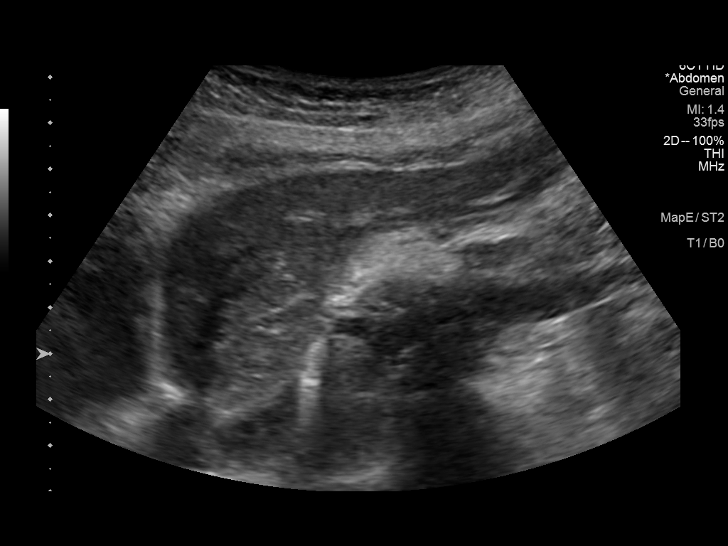
[im 23/50]
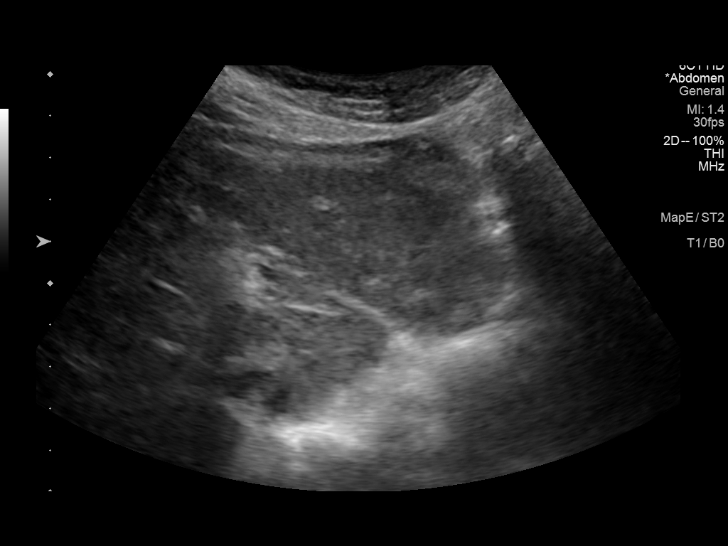
[im 27/50]
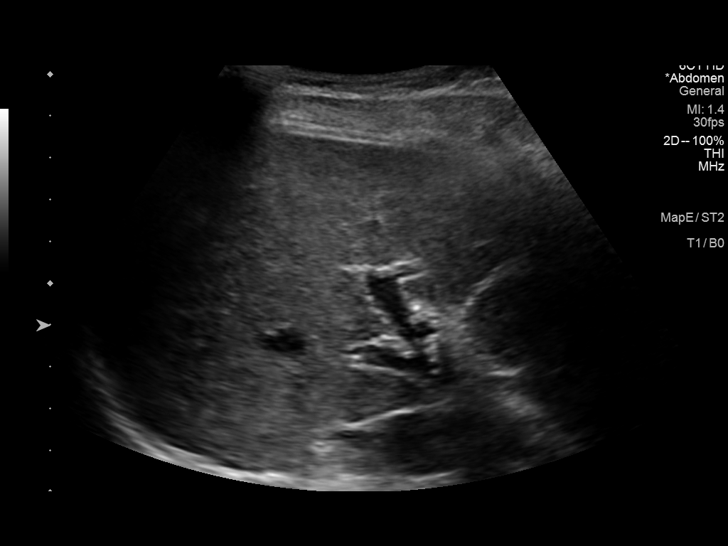
[im 31/50]
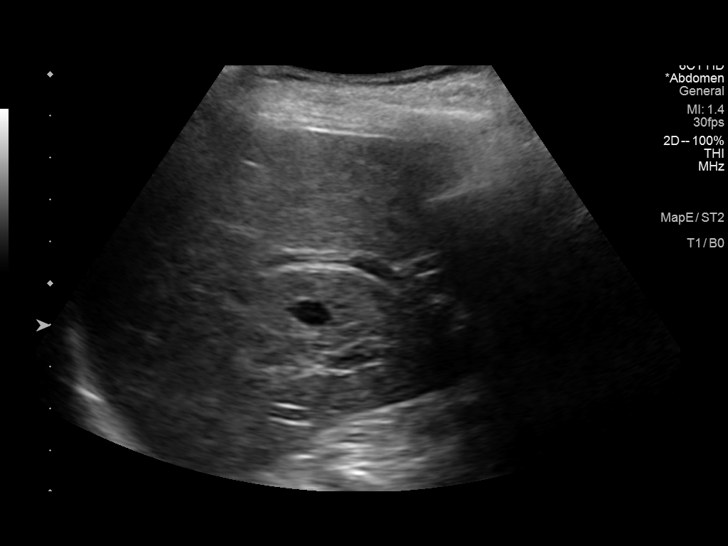
[im 33/50]
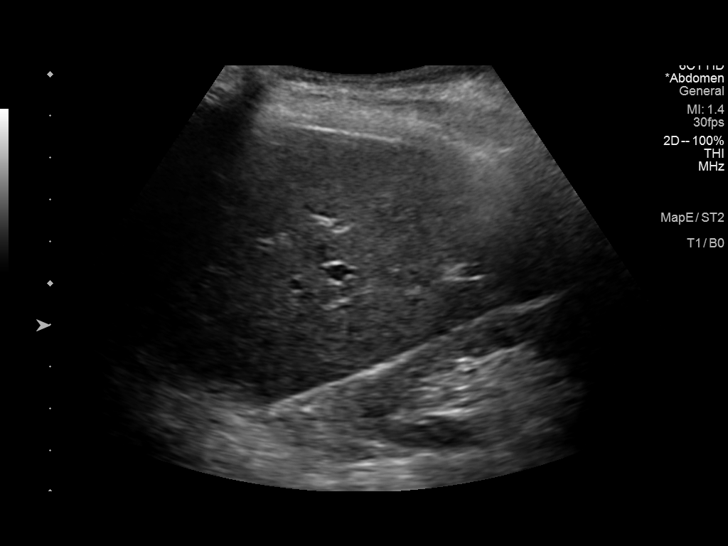
[im 37/50]
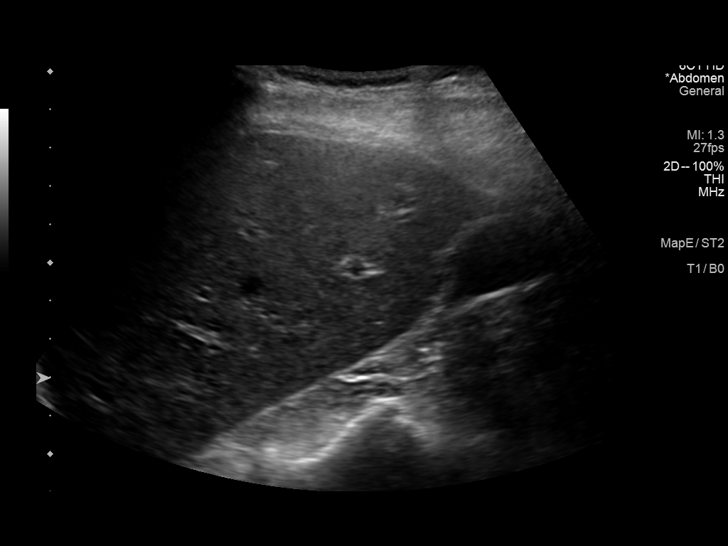
[im 41/50]
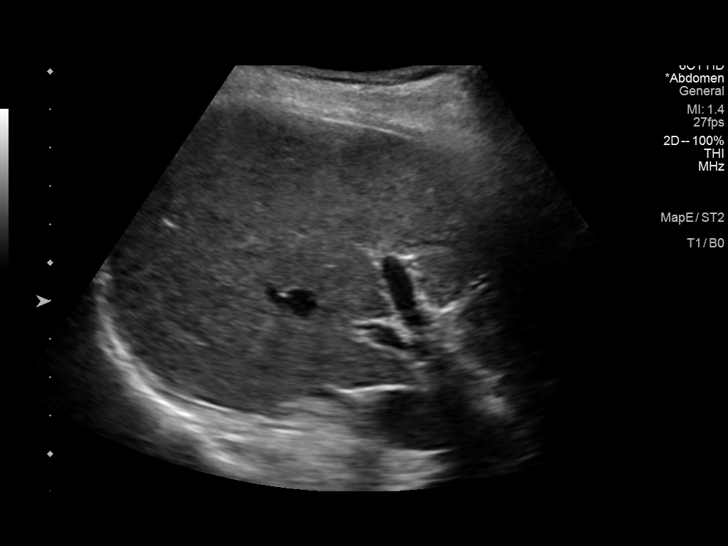
[im 45/50]
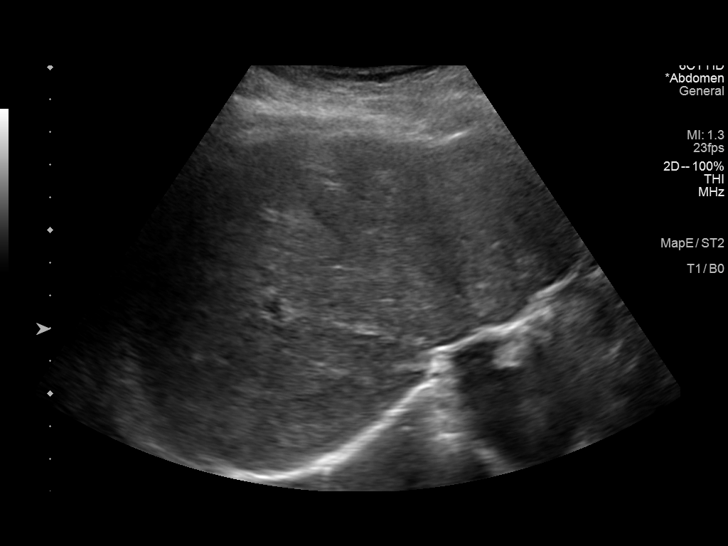
[im 50/50]
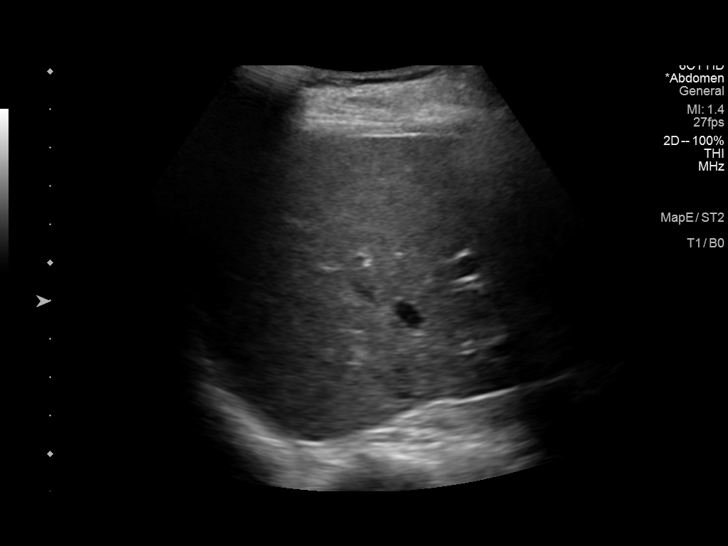

[14 of 25 positions shown; findings below may reference images not displayed]

FINDINGS: Gallbladder:

No gallstones or wall thickening visualized (1.9 mm). No sonographic
Murphy sign noted by sonographer.

Common bile duct:

Diameter: 2.5 mm

Liver:

No focal lesion identified. Within normal limits in parenchymal
echogenicity. Portal vein is patent on color Doppler imaging with
normal direction of blood flow towards the liver.

Other: None.
IMPRESSION: Normal right upper quadrant ultrasound.

## 2021-08-06 MED ORDER — DONEPEZIL HCL 10 MG PO TABS: 10.0000 mg | ORAL_TABLET | Freq: Every day | ORAL | 0 refills | Status: DC

## 2021-08-06 NOTE — Telephone Encounter (Signed)
Spoke with patient's daughter, scheduled appt 10/30/21 at 1:15p.

## 2021-08-06 NOTE — Telephone Encounter (Signed)
Pt's daughter requesting refill for donepezil (ARICEPT) 10 MG tablet. Pharmacy [facility] [address] [place], [place] - [address]

## 2021-08-06 NOTE — Addendum Note (Signed)
Addended by: Oliver Hum S on: 08/06/2021 11:00 AM   Modules accepted: Orders

## 2021-08-19 ENCOUNTER — Other Ambulatory Visit (HOSPITAL_COMMUNITY): Payer: Self-pay | Admitting: Cardiovascular Disease

## 2021-08-19 DIAGNOSIS — Z9889 Other specified postprocedural states: Secondary | ICD-10-CM

## 2021-08-19 DIAGNOSIS — I6522 Occlusion and stenosis of left carotid artery: Secondary | ICD-10-CM

## 2021-09-09 ENCOUNTER — Other Ambulatory Visit: Payer: Self-pay

## 2021-09-09 ENCOUNTER — Ambulatory Visit (INDEPENDENT_AMBULATORY_CARE_PROVIDER_SITE_OTHER): Payer: Medicare Other | Admitting: Podiatry

## 2021-09-09 DIAGNOSIS — L89516 Pressure-induced deep tissue damage of right ankle: Secondary | ICD-10-CM | POA: Diagnosis not present

## 2021-09-09 DIAGNOSIS — E119 Type 2 diabetes mellitus without complications: Secondary | ICD-10-CM | POA: Diagnosis not present

## 2021-09-09 MED ORDER — LIDOCAINE-PRILOCAINE 2.5-2.5 % EX CREA: 1.0000 "application " | TOPICAL_CREAM | CUTANEOUS | 2 refills | Status: DC | PRN

## 2021-09-09 NOTE — Progress Notes (Signed)
°  Subjective:  Patient ID: [name], female    DOB: [birth year],  MRN: [id number]  Chief Complaint  Patient presents with   Diabetic Ulcer      right ankle pressure sore    87 y.o. female presents with the above complaint. History confirmed with patient.  Here with her daughter as well who confirms the history, the same area is becoming very painful again  Objective:  Physical Exam: warm, good capillary refill, no trophic changes or ulcerative lesions, palpable DP and weakly palpable PT pulses and normal sensory exam.  Venous stasis and varicose veins are noted throughout the lower extremities.  On the right lateral ankle there is tenderness over the distal fibula and an area of tender skin.  Nontender pressure area on the left heel as well she is very thin and has little soft tissue coverage over her bony prominence no pain over the ATFL, CFL, there is no instability and no pain with range of motion of the ankle.  No cellulitis no signs of ulceration or open drainage   Assessment:   1. Pressure injury of deep tissue of right ankle   2. Type 2 diabetes mellitus without complication, without long-term current use of insulin ([place])      Plan:  Patient was evaluated and treated and all questions answered.  Discussed etiology and treatment of pressure injuries with the patient and her daughter.  Recommend continue offloading of the area with the offloading boots this at this has not been helping so far.  Her daughter ordered a new foam ring offloading device and they will try this.  I recommended topical treatment with lidocaine prilocaine ointment for pain relief and I sent this to her pharmacy.  I also recommended we check her arterial circulation with an ABI and vascular ultrasound.  This was ordered for [facility] line.  There return to see me as needed if it does not improve.  Advised on signs symptoms of infection or ulceration or drainage and they will let me know if any of  this happens  Return if symptoms worsen or fail to improve.

## 2021-09-09 NOTE — Patient Instructions (Signed)
Vascular Lab Testing: [place] at [facility] [address],  [place]  [address] Main: [phone]   The dressings we discussed are called silicone bordered foam dressings to take pressure off the ankle

## 2021-09-10 ENCOUNTER — Telehealth: Payer: Self-pay | Admitting: *Deleted

## 2021-09-10 NOTE — Telephone Encounter (Signed)
How many times daily, suggested 3 times weekly, please advise

## 2021-09-10 NOTE — Telephone Encounter (Signed)
Yes that is OK

## 2021-09-10 NOTE — Telephone Encounter (Signed)
Returned the call to Courtney([facility]), no answer , left vmessage of suggested frequency of dressing changes.

## 2021-09-10 NOTE — Telephone Encounter (Signed)
Courtney w/ [facility]([phone]) is requesting a verbal order to use silicone border foam,right lateral pressure ulcer, frequency of dressing changes for patient. Please advise.

## 2021-09-10 NOTE — Telephone Encounter (Signed)
That would be fine 

## 2021-09-18 ENCOUNTER — Other Ambulatory Visit: Payer: Self-pay | Admitting: Podiatry

## 2021-09-18 ENCOUNTER — Other Ambulatory Visit: Payer: Self-pay

## 2021-09-18 ENCOUNTER — Ambulatory Visit (HOSPITAL_COMMUNITY)
Admission: RE | Admit: 2021-09-18 | Discharge: 2021-09-18 | Disposition: A | Discharge status: Home / Self Care | Payer: Medicare Other | Source: Ambulatory Visit | Attending: Cardiology | Admitting: Cardiology

## 2021-09-18 DIAGNOSIS — L89516 Pressure-induced deep tissue damage of right ankle: Secondary | ICD-10-CM | POA: Insufficient documentation

## 2021-09-18 DIAGNOSIS — M79605 Pain in left leg: Secondary | ICD-10-CM

## 2021-09-18 DIAGNOSIS — M79604 Pain in right leg: Secondary | ICD-10-CM | POA: Diagnosis not present

## 2021-09-23 ENCOUNTER — Encounter: Payer: Self-pay | Admitting: Cardiovascular Disease

## 2021-09-23 ENCOUNTER — Telehealth: Payer: Self-pay | Admitting: *Deleted

## 2021-09-23 ENCOUNTER — Ambulatory Visit (INDEPENDENT_AMBULATORY_CARE_PROVIDER_SITE_OTHER): Payer: Medicare Other | Admitting: Cardiovascular Disease

## 2021-09-23 ENCOUNTER — Other Ambulatory Visit: Payer: Self-pay

## 2021-09-23 VITALS — BP 146/78 | HR 66 | Ht 67.0 in | Wt 106.2 lb

## 2021-09-23 DIAGNOSIS — I2510 Atherosclerotic heart disease of native coronary artery without angina pectoris: Secondary | ICD-10-CM

## 2021-09-23 DIAGNOSIS — Z01812 Encounter for preprocedural laboratory examination: Secondary | ICD-10-CM

## 2021-09-23 DIAGNOSIS — I739 Peripheral vascular disease, unspecified: Secondary | ICD-10-CM

## 2021-09-23 DIAGNOSIS — I6523 Occlusion and stenosis of bilateral carotid arteries: Secondary | ICD-10-CM | POA: Diagnosis not present

## 2021-09-23 DIAGNOSIS — I5032 Chronic diastolic (congestive) heart failure: Secondary | ICD-10-CM | POA: Diagnosis not present

## 2021-09-23 DIAGNOSIS — E785 Hyperlipidemia, unspecified: Secondary | ICD-10-CM | POA: Diagnosis not present

## 2021-09-23 MED ORDER — ASPIRIN EC 81 MG PO TBEC: 81.0000 mg | DELAYED_RELEASE_TABLET | Freq: Every day | ORAL | 3 refills | Status: DC

## 2021-09-23 NOTE — Telephone Encounter (Signed)
Pt daughter reaching out to schedule procedure... please advise

## 2021-09-23 NOTE — H&P (View-Only) (Signed)
Cardiology Office Note   Date:  09/24/2021   ID:  [name], DOB 10/31/1931, MRN [id number]  PCP:  Michael Boston, MD  Cardiologist: Dr. Audie Box  No chief complaint on file.     History of Present Illness: [name] is a 88 y.o. female who was referred by Dr. Sherryle Lis for evaluation management of peripheral arterial disease. She has known history of chronic diastolic heart failure, coronary artery disease, carotid artery disease, chronic kidney disease, mild dementia, essential hypertension and hyperlipidemia.  She is status post right CEA.  She developed a pressure ulcer on the right lateral ankle in February of last year that has not healed since then.  If anything, it has gotten worse with increased discomfort.  She does complain of bilateral leg pain at night mostly affecting the right leg.  She underwent recent noninvasive vascular studies which showed an ABI of 0.93 on the right and 1.26 on the left.  Duplex showed severe stenosis in the right TP trunk with two-vessel runoff below the knee with an occluded mid posterior tibial artery.  She has dementia but it is overall mild.  She denies chest pain or shortness of breath.  Past Medical History:  Diagnosis Date   Coronary artery disease    DM (diabetes mellitus) ([place])    HTN (hypertension)    Hyperlipidemia    Myocardial infarct ([place]) 2012   Renal disorder     Past Surgical History:  Procedure Laterality Date   CARDIAC CATHETERIZATION     CAROTID ENDARTERECTOMY     CORONARY ANGIOPLASTY     CORONARY STENT PLACEMENT     IR THORACENTESIS ASP PLEURAL SPACE W/IMG GUIDE  08/13/2020     Current Outpatient Medications  Medication Sig Dispense Refill   aspirin EC 81 MG tablet Take 1 tablet (81 mg total) by mouth daily. Swallow whole. 90 tablet 3   Biotin 5000 MCG TABS Take 5,000 mcg by mouth daily.     Cholecalciferol (VITAMIN D3 SUPER STRENGTH) 50 MCG (2000 UT) TABS Take 2,000 Units by mouth daily.      clopidogrel (PLAVIX) 75 MG tablet Take 75 mg by mouth every Monday, Wednesday, and Friday.      donepezil (ARICEPT) 10 MG tablet Take 1 tablet (10 mg total) by mouth at bedtime. 90 tablet 0   furosemide (LASIX) 20 MG tablet Take 1 tablet (20 mg total) by mouth daily as needed (weight gain 2-3 lbs.). 30 tablet 3   levothyroxine (SYNTHROID) 25 MCG tablet Take 25 mcg by mouth daily before breakfast.     lidocaine-prilocaine (EMLA) cream Apply 1 application topically as needed. 30 g 2   rosuvastatin (CRESTOR) 10 MG tablet 10 mg.     sertraline (ZOLOFT) 100 MG tablet Take 100 mg by mouth at bedtime.     No current facility-administered medications for this visit.    Allergies:   Tape, Lisinopril, and Nitrofuran derivatives    Social History:  The patient  reports that she quit smoking about 51 years ago. Her smoking use included cigarettes. She has a 2.00 pack-year smoking history. She has never used smokeless tobacco. She reports that she does not drink alcohol and does not use drugs.   Family History:  The patient's family history includes Alzheimer's disease in her mother; Heart attack in her father; Heart disease in her father; Heart failure in her brother; Lymphoma in her sister.    ROS:  Please see the history of present illness.   Otherwise,  review of systems are positive for none.   All other systems are reviewed and negative.    PHYSICAL EXAM: VS:  BP (!) 146/78    Pulse 66    Ht 5\' 7"  (1.702 m)    Wt 106 lb 3.2 oz (48.2 kg)    SpO2 97%    BMI 16.63 kg/m  , BMI Body mass index is 16.63 kg/m. GEN: Well nourished, well developed, in no acute distress  HEENT: normal  Neck: no JVD, carotid bruits, or masses Cardiac: RRR; no murmurs, rubs, or gallops,no edema  Respiratory:  clear to auscultation bilaterally, normal work of breathing GI: soft, nontender, nondistended, + BS MS: no deformity or atrophy  Skin: warm and dry, no rash Neuro:  Strength and sensation are intact Psych:  euthymic mood, full affect   EKG:  EKG is ordered today. The ekg ordered today demonstrates sinus rhythm with a PVC.   Recent Labs: No results found for requested labs within last 8760 hours.    Lipid Panel No results found for: CHOL, TRIG, HDL, CHOLHDL, VLDL, LDLCALC, LDLDIRECT    Wt Readings from Last 3 Encounters:  09/23/21 106 lb 3.2 oz (48.2 kg)  08/04/21 101 lb (45.8 kg)  06/07/21 108 lb (49 kg)       No flowsheet data found.    ASSESSMENT AND PLAN:  1.  Peripheral arterial disease: The patient has nonhealing pressure ulceration affecting the right lateral ankle.  There is evidence of significant peripheral arterial disease which is likely contributing to poor healing.  I discussed with the patient and her daughter the natural history and management of peripheral arterial disease.  Due to poor tissue healing, I have suggested proceeding with abdominal aortogram with lower extremity runoff and possible endovascular intervention.  I discussed the procedure in details as well as risks and benefits.  The patient does have chronic kidney disease and is at higher risk for contrast-induced nephropathy.  She will require preprocedure hydration.  The patient wants to think about this and discuss with her other children before deciding.  I asked her to start aspirin 81 mg daily.  2.  Chronic diastolic heart failure: She appears to be euvolemic.  3.  Coronary artery disease involving native coronary arteries without angina: Currently with no anginal symptoms.  4.  Carotid artery disease: Stable overall.  5.  Hyperlipidemia: Continue rosuvastatin with a target LDL of less than 70.    Disposition:   The patient and family will call us if they decide to proceed with an angiogram.  Signed,  [name], MD  09/24/2021 2:05 PM    [place]

## 2021-09-23 NOTE — Progress Notes (Signed)
Cardiology Office Note   Date:  09/24/2021   ID:  [name], DOB 02-Oct-1931, MRN [id number]  PCP:  Michael Boston, MD  Cardiologist: Dr. Audie Box  No chief complaint on file.     History of Present Illness: [name] is a 88 y.o. female who was referred by Dr. Sherryle Lis for evaluation management of peripheral arterial disease. She has known history of chronic diastolic heart failure, coronary artery disease, carotid artery disease, chronic kidney disease, mild dementia, essential hypertension and hyperlipidemia.  She is status post right CEA.  She developed a pressure ulcer on the right lateral ankle in February of last year that has not healed since then.  If anything, it has gotten worse with increased discomfort.  She does complain of bilateral leg pain at night mostly affecting the right leg.  She underwent recent noninvasive vascular studies which showed an ABI of 0.93 on the right and 1.26 on the left.  Duplex showed severe stenosis in the right TP trunk with two-vessel runoff below the knee with an occluded mid posterior tibial artery.  She has dementia but it is overall mild.  She denies chest pain or shortness of breath.  Past Medical History:  Diagnosis Date   Coronary artery disease    DM (diabetes mellitus) ([place])    HTN (hypertension)    Hyperlipidemia    Myocardial infarct ([place]) 2012   Renal disorder     Past Surgical History:  Procedure Laterality Date   CARDIAC CATHETERIZATION     CAROTID ENDARTERECTOMY     CORONARY ANGIOPLASTY     CORONARY STENT PLACEMENT     IR THORACENTESIS ASP PLEURAL SPACE W/IMG GUIDE  08/13/2020     Current Outpatient Medications  Medication Sig Dispense Refill   aspirin EC 81 MG tablet Take 1 tablet (81 mg total) by mouth daily. Swallow whole. 90 tablet 3   Biotin 5000 MCG TABS Take 5,000 mcg by mouth daily.     Cholecalciferol (VITAMIN D3 SUPER STRENGTH) 50 MCG (2000 UT) TABS Take 2,000 Units by mouth daily.      clopidogrel (PLAVIX) 75 MG tablet Take 75 mg by mouth every Monday, Wednesday, and Friday.      donepezil (ARICEPT) 10 MG tablet Take 1 tablet (10 mg total) by mouth at bedtime. 90 tablet 0   furosemide (LASIX) 20 MG tablet Take 1 tablet (20 mg total) by mouth daily as needed (weight gain 2-3 lbs.). 30 tablet 3   levothyroxine (SYNTHROID) 25 MCG tablet Take 25 mcg by mouth daily before breakfast.     lidocaine-prilocaine (EMLA) cream Apply 1 application topically as needed. 30 g 2   rosuvastatin (CRESTOR) 10 MG tablet 10 mg.     sertraline (ZOLOFT) 100 MG tablet Take 100 mg by mouth at bedtime.     No current facility-administered medications for this visit.    Allergies:   Tape, Lisinopril, and Nitrofuran derivatives    Social History:  The patient  reports that she quit smoking about 51 years ago. Her smoking use included cigarettes. She has a 2.00 pack-year smoking history. She has never used smokeless tobacco. She reports that she does not drink alcohol and does not use drugs.   Family History:  The patient's family history includes Alzheimer's disease in her mother; Heart attack in her father; Heart disease in her father; Heart failure in her brother; Lymphoma in her sister.    ROS:  Please see the history of present illness.   Otherwise,  review of systems are positive for none.   All other systems are reviewed and negative.    PHYSICAL EXAM: VS:  BP (!) 146/78    Pulse 66    Ht 5\' 7"  (1.702 m)    Wt 106 lb 3.2 oz (48.2 kg)    SpO2 97%    BMI 16.63 kg/m  , BMI Body mass index is 16.63 kg/m. GEN: Well nourished, well developed, in no acute distress  HEENT: normal  Neck: no JVD, carotid bruits, or masses Cardiac: RRR; no murmurs, rubs, or gallops,no edema  Respiratory:  clear to auscultation bilaterally, normal work of breathing GI: soft, nontender, nondistended, + BS MS: no deformity or atrophy  Skin: warm and dry, no rash Neuro:  Strength and sensation are intact Psych:  euthymic mood, full affect   EKG:  EKG is ordered today. The ekg ordered today demonstrates sinus rhythm with a PVC.   Recent Labs: No results found for requested labs within last 8760 hours.    Lipid Panel No results found for: CHOL, TRIG, HDL, CHOLHDL, VLDL, LDLCALC, LDLDIRECT    Wt Readings from Last 3 Encounters:  09/23/21 106 lb 3.2 oz (48.2 kg)  08/04/21 101 lb (45.8 kg)  06/07/21 108 lb (49 kg)       No flowsheet data found.    ASSESSMENT AND PLAN:  1.  Peripheral arterial disease: The patient has nonhealing pressure ulceration affecting the right lateral ankle.  There is evidence of significant peripheral arterial disease which is likely contributing to poor healing.  I discussed with the patient and her daughter the natural history and management of peripheral arterial disease.  Due to poor tissue healing, I have suggested proceeding with abdominal aortogram with lower extremity runoff and possible endovascular intervention.  I discussed the procedure in details as well as risks and benefits.  The patient does have chronic kidney disease and is at higher risk for contrast-induced nephropathy.  She will require preprocedure hydration.  The patient wants to think about this and discuss with her other children before deciding.  I asked her to start aspirin 81 mg daily.  2.  Chronic diastolic heart failure: She appears to be euvolemic.  3.  Coronary artery disease involving native coronary arteries without angina: Currently with no anginal symptoms.  4.  Carotid artery disease: Stable overall.  5.  Hyperlipidemia: Continue rosuvastatin with a target LDL of less than 70.    Disposition:   The patient and family will call us if they decide to proceed with an angiogram.  Signed,  [name], MD  09/24/2021 2:05 PM    [place]

## 2021-09-23 NOTE — Patient Instructions (Signed)
Medication Instructions:  START Aspirin 81 mg once daily  *If you need a refill on your cardiac medications before your next appointment, please call your pharmacy*   Lab Work: None ordered If you have labs (blood work) drawn today and your tests are completely normal, you will receive your results only by: [place] (if you have MyChart) OR A paper copy in the mail If you have any lab test that is abnormal or we need to change your treatment, we will call you to review the results.   Testing/Procedures: None ordered   Follow-Up: At [facility], you and your health needs are our priority.  As part of our continuing mission to provide you with exceptional heart care, we have created designated Provider Care Teams.  These Care Teams include your primary Cardiologist (physician) and Advanced Practice Providers (APPs -  Physician Assistants and Nurse Practitioners) who all work together to provide you with the care you need, when you need it.  We recommend signing up for the patient portal called "MyChart".  Sign up information is provided on this After Visit Summary.  MyChart is used to connect with patients for Virtual Visits (Telemedicine).  Patients are able to view lab/test results, encounter notes, upcoming appointments, etc.  Non-urgent messages can be sent to your provider as well.   To learn more about what you can do with MyChart, go to [email or web address].    Your next appointment:   Call the office at [phone] and ask for [facility] when you are ready to schedule the Angiogram

## 2021-09-24 ENCOUNTER — Encounter: Payer: Self-pay | Admitting: *Deleted

## 2021-09-24 NOTE — Telephone Encounter (Signed)
Next available option for the procedure would be 1/18 at 8:30 with Dr. Fletcher Anon.  Please see MyChart message.

## 2021-09-24 NOTE — Telephone Encounter (Signed)
Spoke to the daughter. Procedure will now be 1/25 with Dr. Fletcher Anon. Instructions have been given and will be sent to [facility].   [facility] OFFICE [place], [place] [address] Dept: [phone] Loc: [phone]  Mariabelen Pressly  09/24/2021  You are scheduled for a Peripheral Angiogram on Wednesday, January 25 with Dr. [name].  1. Please arrive at the [facility] (Main Entrance A) at [facility]: [address] [place], [place] [address] at 6:30 AM (This time is four hours before your procedure to ensure your preparation). Free valet parking service is available.   Special note: Every effort is made to have your procedure done on time. Please understand that emergencies sometimes delay scheduled procedures.  2. Diet: Do not eat solid foods after midnight.  The patient may have clear liquids until 5am upon the day of the procedure.  3. Labs: You will need to have blood drawn by 10/08/21. You may come to our office or any [facility]. You do not need an appointment for the lab. Once in our office lobby there is a podium where you can sign in and ring the doorbell to alert [place] that you are here. The lab is open from 8:00 am to 4:30 pm; closed for lunch from 12:45pm-1:45pm.   4. Medication instructions in preparation for your procedure: Nothing to hold.   On the morning of your procedure, take your Aspirin and Plavix/Clopidogrel and any morning medicines NOT listed above.  You may use sips of water.  5. Plan for one night stay--bring personal belongings. 6. Bring a current list of your medications and current insurance cards. 7. You MUST have a responsible person to drive you home. 8. Someone MUST be with you the first 24 hours after you arrive home or your discharge will be delayed. 9. Please wear clothes that are easy to get on and off and wear slip-on shoes.  Thank you for allowing  [place] to care for you!   -- [facility]

## 2021-10-13 ENCOUNTER — Telehealth: Payer: Self-pay | Admitting: *Deleted

## 2021-10-13 NOTE — Telephone Encounter (Addendum)
Abdominal aortogram scheduled at [facility] for: Wednesday October 15, 2021 10:30 AM [facility] Main Entrance A [facility]) at: 6:30 AM-pre-procedure hydration   Diet-no solid food after midnight prior to cath, clear liquids until 5 AM day of procedure.  Medication instructions for procedure: -Hold:  Lasix-day before and day of procedure-per protocol (pt takes prn and has not needed recently) -Except hold medications usual morning medications can be taken pre-cath with sips of water including aspirin 81 mg and Plavix 75 mg.    Confirmed patient has responsible adult to drive home post procedure and be with patient first 24 hours after arriving home.  [facility] does allow one visitor to accompany you and wait in the hospital waiting room while you are there for your procedure. You and your visitor will be asked to wear a mask once you enter the hospital. *   Procedure/visitor instructions with patient's daughter (DPR), Sherrie Mustache [place].  Because of patient's history of Alzheimer's Disease/cognitive impairment, Sherrie Mustache requests that she be allowed to stay with her mother to assist her mother during the time her mother is in Short Stay/ at hospital for procedure.  Sherrie Mustache requests that [employer] system is available for pt to use during her time at the hospital for procedure-will forward request to RN Cath Lab.

## 2021-10-14 LAB — BASIC METABOLIC PANEL
BUN/Creatinine Ratio: 19 (ref 12–28)
BUN: 28 mg/dL — ABNORMAL HIGH (ref 8–27)
CO2: 26 mmol/L (ref 20–29)
Calcium: 9.4 mg/dL (ref 8.7–10.3)
Chloride: 97 mmol/L (ref 96–106)
Creatinine, Ser: 1.45 mg/dL — ABNORMAL HIGH (ref 0.57–1.00)
Glucose: 323 mg/dL — ABNORMAL HIGH (ref 70–99)
Potassium: 4.5 mmol/L (ref 3.5–5.2)
Sodium: 134 mmol/L (ref 134–144)
eGFR: 35 mL/min/{1.73_m2} — ABNORMAL LOW (ref >59)

## 2021-10-14 LAB — CBC
Hematocrit: 32.7 % — ABNORMAL LOW (ref 34.0–46.6)
Hemoglobin: 10.6 g/dL — ABNORMAL LOW (ref 11.1–15.9)
MCH: 31.7 pg (ref 26.6–33.0)
MCHC: 32.4 g/dL (ref 31.5–35.7)
MCV: 98 fL — ABNORMAL HIGH (ref 79–97)
Platelets: 254 10*3/uL (ref 150–450)
RBC: 3.34 x10E6/uL — ABNORMAL LOW (ref 3.77–5.28)
RDW: 11.9 % (ref 11.7–15.4)
WBC: 9 10*3/uL (ref 3.4–10.8)

## 2021-10-15 ENCOUNTER — Encounter: Payer: Self-pay | Admitting: Cardiovascular Disease

## 2021-10-15 ENCOUNTER — Other Ambulatory Visit: Payer: Self-pay

## 2021-10-15 ENCOUNTER — Ambulatory Visit (HOSPITAL_COMMUNITY)
Admission: RE | Disposition: A | Discharge status: Home / Self Care | Payer: Self-pay | Source: Home / Self Care | Attending: Cardiovascular Disease

## 2021-10-15 ENCOUNTER — Ambulatory Visit (HOSPITAL_COMMUNITY)
Admission: RE | Admit: 2021-10-15 | Discharge: 2021-10-15 | Disposition: A | Discharge status: Home / Self Care | Payer: Medicare Other | Attending: Cardiovascular Disease | Admitting: Cardiovascular Disease

## 2021-10-15 DIAGNOSIS — E1151 Type 2 diabetes mellitus with diabetic peripheral angiopathy without gangrene: Secondary | ICD-10-CM | POA: Insufficient documentation

## 2021-10-15 DIAGNOSIS — I70235 Atherosclerosis of native arteries of right leg with ulceration of other part of foot: Secondary | ICD-10-CM | POA: Insufficient documentation

## 2021-10-15 DIAGNOSIS — E785 Hyperlipidemia, unspecified: Secondary | ICD-10-CM | POA: Diagnosis not present

## 2021-10-15 DIAGNOSIS — F03A0 Unspecified dementia, mild, without behavioral disturbance, psychotic disturbance, mood disturbance, and anxiety: Secondary | ICD-10-CM | POA: Diagnosis not present

## 2021-10-15 DIAGNOSIS — I70231 Atherosclerosis of native arteries of right leg with ulceration of thigh: Secondary | ICD-10-CM | POA: Diagnosis not present

## 2021-10-15 DIAGNOSIS — E11621 Type 2 diabetes mellitus with foot ulcer: Secondary | ICD-10-CM | POA: Diagnosis not present

## 2021-10-15 DIAGNOSIS — I130 Hypertensive heart and chronic kidney disease with heart failure and stage 1 through stage 4 chronic kidney disease, or unspecified chronic kidney disease: Secondary | ICD-10-CM | POA: Diagnosis not present

## 2021-10-15 DIAGNOSIS — N189 Chronic kidney disease, unspecified: Secondary | ICD-10-CM | POA: Diagnosis not present

## 2021-10-15 DIAGNOSIS — I739 Peripheral vascular disease, unspecified: Secondary | ICD-10-CM

## 2021-10-15 DIAGNOSIS — I5032 Chronic diastolic (congestive) heart failure: Secondary | ICD-10-CM | POA: Diagnosis not present

## 2021-10-15 DIAGNOSIS — E1122 Type 2 diabetes mellitus with diabetic chronic kidney disease: Secondary | ICD-10-CM | POA: Insufficient documentation

## 2021-10-15 DIAGNOSIS — I2510 Atherosclerotic heart disease of native coronary artery without angina pectoris: Secondary | ICD-10-CM | POA: Diagnosis not present

## 2021-10-15 DIAGNOSIS — Z87891 Personal history of nicotine dependence: Secondary | ICD-10-CM | POA: Insufficient documentation

## 2021-10-15 DIAGNOSIS — R531 Weakness: Secondary | ICD-10-CM

## 2021-10-15 DIAGNOSIS — L97519 Non-pressure chronic ulcer of other part of right foot with unspecified severity: Secondary | ICD-10-CM | POA: Diagnosis not present

## 2021-10-15 HISTORY — PX: PERIPHERAL VASCULAR BALLOON ANGIOPLASTY: CATH118281

## 2021-10-15 HISTORY — PX: ABDOMINAL AORTOGRAM W/LOWER EXTREMITY: CATH118223

## 2021-10-15 LAB — POCT ACTIVATED CLOTTING TIME
Activated Clotting Time: 179 seconds
Activated Clotting Time: 191 seconds

## 2021-10-15 SURGERY — ABDOMINAL AORTOGRAM W/LOWER EXTREMITY
Anesthesia: LOCAL

## 2021-10-15 MED ORDER — LABETALOL HCL 5 MG/ML IV SOLN: 10.0000 mg | INTRAVENOUS | Status: DC | PRN

## 2021-10-15 MED ORDER — SODIUM CHLORIDE 0.9 % IV SOLN: INTRAVENOUS | Status: DC

## 2021-10-15 MED ORDER — SODIUM CHLORIDE 0.9% FLUSH: 3.0000 mL | INTRAVENOUS | Status: DC | PRN

## 2021-10-15 MED ORDER — LIDOCAINE HCL (PF) 1 % IJ SOLN
INTRAMUSCULAR | Status: DC | PRN
  Administered 2021-10-15: 12 mL

## 2021-10-15 MED ORDER — ONDANSETRON HCL 4 MG/2ML IJ SOLN: 4.0000 mg | Freq: Four times a day (QID) | INTRAMUSCULAR | Status: DC | PRN

## 2021-10-15 MED ORDER — LIDOCAINE HCL (PF) 1 % IJ SOLN
INTRAMUSCULAR | Status: AC
  Filled 2021-10-15: qty 30

## 2021-10-15 MED ORDER — SODIUM CHLORIDE 0.9 % IV SOLN: 250.0000 mL | INTRAVENOUS | Status: DC | PRN

## 2021-10-15 MED ORDER — HEPARIN SODIUM (PORCINE) 1000 UNIT/ML IJ SOLN
INTRAMUSCULAR | Status: AC
  Filled 2021-10-15: qty 10

## 2021-10-15 MED ORDER — SODIUM CHLORIDE 0.9% FLUSH: 3.0000 mL | Freq: Two times a day (BID) | INTRAVENOUS | Status: DC

## 2021-10-15 MED ORDER — ACETAMINOPHEN 325 MG PO TABS: 650.0000 mg | ORAL_TABLET | ORAL | Status: DC | PRN

## 2021-10-15 MED ORDER — NITROGLYCERIN 1 MG/10 ML FOR IR/CATH LAB
INTRA_ARTERIAL | Status: DC | PRN
  Administered 2021-10-15: 400 ug via INTRA_ARTERIAL

## 2021-10-15 MED ORDER — HEPARIN SODIUM (PORCINE) 1000 UNIT/ML IJ SOLN
INTRAMUSCULAR | Status: DC | PRN
  Administered 2021-10-15: 2000 [IU] via INTRAVENOUS
  Administered 2021-10-15: 3000 [IU] via INTRAVENOUS

## 2021-10-15 MED ORDER — ASPIRIN 81 MG PO CHEW: 81.0000 mg | CHEWABLE_TABLET | ORAL | Status: DC

## 2021-10-15 MED ORDER — IODIXANOL 320 MG/ML IV SOLN
INTRAVENOUS | Status: DC | PRN
  Administered 2021-10-15: 12:00:00 73 mL

## 2021-10-15 MED ORDER — LABETALOL HCL 5 MG/ML IV SOLN
INTRAVENOUS | Status: AC
  Filled 2021-10-15: qty 4

## 2021-10-15 MED ORDER — NITROGLYCERIN 1 MG/10 ML FOR IR/CATH LAB
INTRA_ARTERIAL | Status: AC
  Filled 2021-10-15: qty 10

## 2021-10-15 MED ORDER — HEPARIN (PORCINE) IN NACL 1000-0.9 UT/500ML-% IV SOLN
INTRAVENOUS | Status: DC | PRN
  Administered 2021-10-15 (×2): 500 mL

## 2021-10-15 SURGICAL SUPPLY — 21 items
BALLN COYOTE ES OTW 3X20X143 (BALLOONS) ×3
BALLOON COYOTE ES OTW 3X20X143 (BALLOONS) IMPLANT
CATH ANGIO 5F PIGTAIL 65CM (CATHETERS) ×1 IMPLANT
CATH CROSS OVER TEMPO 5F (CATHETERS) ×1 IMPLANT
CATH QUICKCROSS .018X135CM (MICROCATHETER) ×1 IMPLANT
CATH STRAIGHT 5FR 65CM (CATHETERS) ×1 IMPLANT
CLOSURE PERCLOSE PROSTYLE (VASCULAR PRODUCTS) ×1 IMPLANT
DCB RANGER 4.0X60 135 (BALLOONS) IMPLANT
KIT ENCORE 26 ADVANTAGE (KITS) ×2 IMPLANT
KIT PV (KITS) ×3 IMPLANT
RANGER DCB 4.0X60 135 (BALLOONS) ×3
SHEATH PINNACLE 5F 10CM (SHEATH) ×1 IMPLANT
SHEATH PINNACLE 6F 10CM (SHEATH) ×1 IMPLANT
SHEATH PINNACLE ST 6F 65CM (SHEATH) ×1 IMPLANT
SYR MEDRAD MARK 7 150ML (SYRINGE) ×3 IMPLANT
TAPE SHOOT N SEE (TAPE) ×1 IMPLANT
TRANSDUCER W/STOPCOCK (MISCELLANEOUS) ×3 IMPLANT
TRAY PV CATH (CUSTOM PROCEDURE TRAY) ×3 IMPLANT
WIRE HITORQ VERSACORE ST 145CM (WIRE) ×1 IMPLANT
WIRE MICRO SET SILHO 5FR 7 (SHEATH) ×1 IMPLANT
WIRE RUNTHROUGH .014X300CM (WIRE) ×1 IMPLANT

## 2021-10-15 NOTE — Progress Notes (Signed)
Pt ambulated without difficulty or bleeding.   Discharged home with daughter who will drive and stay with pt x 24 hr

## 2021-10-15 NOTE — Interval H&P Note (Signed)
History and Physical Interval Note:  10/15/2021 10:51 AM  [name]  has presented today for surgery, with the diagnosis of pad.  The various methods of treatment have been discussed with the patient and family. After consideration of risks, benefits and other options for treatment, the patient has consented to  Procedure(s): ABDOMINAL AORTOGRAM W/LOWER EXTREMITY (N/A) as a surgical intervention.  The patient's history has been reviewed, patient examined, no change in status, stable for surgery.  I have reviewed the patient's chart and labs.  Questions were answered to the patient's satisfaction.     [name]

## 2021-10-16 ENCOUNTER — Encounter (HOSPITAL_COMMUNITY): Payer: Self-pay | Admitting: Cardiovascular Disease

## 2021-10-16 ENCOUNTER — Other Ambulatory Visit: Payer: Self-pay | Admitting: *Deleted

## 2021-10-16 DIAGNOSIS — I739 Peripheral vascular disease, unspecified: Secondary | ICD-10-CM

## 2021-10-16 MED FILL — Labetalol HCl IV Soln Prefilled Syringe 20 MG/4ML (5 MG/ML): INTRAVENOUS | Qty: 4 | Status: AC

## 2021-10-30 ENCOUNTER — Other Ambulatory Visit: Payer: Self-pay

## 2021-10-30 ENCOUNTER — Encounter: Payer: Self-pay | Admitting: Neurology

## 2021-10-30 ENCOUNTER — Ambulatory Visit (INDEPENDENT_AMBULATORY_CARE_PROVIDER_SITE_OTHER): Payer: Medicare Other | Admitting: Neurology

## 2021-10-30 VITALS — BP 140/60 | HR 72 | Ht 66.0 in | Wt 105.0 lb

## 2021-10-30 DIAGNOSIS — F0390 Unspecified dementia without behavioral disturbance: Secondary | ICD-10-CM

## 2021-10-30 DIAGNOSIS — I6523 Occlusion and stenosis of bilateral carotid arteries: Secondary | ICD-10-CM

## 2021-10-30 MED ORDER — DONEPEZIL HCL 10 MG PO TABS: 10.0000 mg | ORAL_TABLET | Freq: Every day | ORAL | 3 refills | Status: DC

## 2021-10-30 NOTE — Patient Instructions (Addendum)
It was nice to see you both again today.  Your memory scores are actually better, I am glad to hear you are stable.   We will continue with your current medication for memory loss at the current dose. Fall risk is real! Please remember to stand up slowly and get your bearings first turn slowly, no bending down to pick anything, no heavy lifting, be extra careful at night and first thing in the morning. Also, be careful in the Bathroom and the kitchen. Use your walker at all times.

## 2021-10-30 NOTE — Progress Notes (Signed)
Subjective:    Patient ID: [name], female    DOB: 1931-12-08, 88 y.o.   MRN: [id number]  HPI    Interim history:   Ms. [name] is an 88 year old left-handed woman with an underlying complex medical history of diabetes, coronary artery disease with status post stent placements, history of MI, hypertension, hyperlipidemia, hypothyroidism, depression, osteoarthritis, carotid artery disease with status post carotid endarterectomy, history of pleural effusions, followed by pulmonology, who presents for follow-up consultation of her memory loss.  The patient is accompanied by her daughter today and presents after a longer gap of over 15 months.  She canceled an interim appointment with the nurse practitioner for January 2022.  I first met her at the request of her primary care physician on 06/24/2020, at which time she reported an approximately 1 year history of forgetfulness.  Her daughter had her move in with her in February 2021.  Daughter had noticed more confusion and forgetfulness in the month or 2 prior to the visit.  Her MMSE was 15 out of 30 at the time.  We did show her additional blood work, she had previously had blood work with her primary care.  Her A1c was elevated mildly at 7.4.  She was advised to start a trial of Aricept low-dose, at 5 mg daily.  We increase this in the interim in November 2021 to 10 mg once daily.  Today, 10/30/2021: She reports doing fairly well.  History is primarily provided by her daughter who reports that patient has been able to tolerate the donepezil at 10 mg once daily, appetite is good, weight fairly stable, they try to encourage water intake.  She uses a 2 wheeled walker but has fallen a few times, sustained bruising and skinned her ankle.  She has an ulcer in the right foot and also left heel, she recently had endovascular intervention on 10/15/2021 under Dr. Fletcher Anon.  Her cardiologist is Dr. Davina Poke.  She is on aspirin and Plavix.  She bruises easily.  Her  daughter feels that her memory is stable and in fact, she has had less confusion.  She does have difficulty with short-term memory and forgetfulness, and in particular, retaining new information.   The patient's allergies, current medications, family history, past medical history, past social history, past surgical history and problem list were reviewed and updated as appropriate.   Previously:   06/24/20: (She) reports forgetfulness.  Symptoms have been ongoing for perhaps about a year as far as confusion goes.  Her daughter has noticed a more significant degree of confusion and forgetfulness in the past month or month and a half.  Patient is widowed and lived alone in [place] since 2011.  In February 2021 she moved in with her daughter.  She has had several falls.  She injured her hip and required hip replacement.  She has a family history of memory loss.  Her mother had dementia.  Her younger sister had dementia and passed away.  The patient had a total of 2 brothers and 2 sisters, currently only 1 sister is alive.  She has 3 daughters and 1 son, she has a daughter, [name] in [place], she has a daughter, [name] in [place] and [name], her son lives in [place].  Her weight has been stable, appetite fairly good.  She has had some trouble sleeping and tried melatonin but is not currently taking it.  She has been using a wheelchair for longer distances.  She had no recent  falls.  Her husband passed away in 11-23-09. I reviewed your note from 05/15/2020.  She had lab work at the time including BMP, vitamin B12, and urinalysis.  You also ordered a brain MRI with and without contrast.  We requested blood test results.  I was able to review her results, in part on the portal that the daughter was able to pull up on her cell phone.  Her urinalysis was benign, vitamin B12 level 419, glucose 128, BUN 26, creatinine 1.5, numbers have improved since earlier in August.  TSH on 05/01/2020 was 0.79.  A1c in July 2021 was  6.5.  Lipid panel earlier in the year in March showed a total cholesterol of 323, triglycerides 161, HDL 59, LDL 232.   She had a brain MRI without contrast on 05/16/2020 and I reviewed the results: IMPRESSION: No evidence of recent infarction, hemorrhage, or mass. Parenchymal volume loss and chronic microvascular ischemic changes.  Her Past Medical History Is Significant For: Past Medical History:  Diagnosis Date   Coronary artery disease    DM (diabetes mellitus) ([place])    HTN (hypertension)    Hyperlipidemia    Myocardial infarct [facility]) 23-Nov-2010   Renal disorder     Her Past Surgical History Is Significant For: Past Surgical History:  Procedure Laterality Date   ABDOMINAL AORTOGRAM W/LOWER EXTREMITY N/A 10/15/2021   Procedure: ABDOMINAL AORTOGRAM W/LOWER EXTREMITY;  Surgeon: Wellington Hampshire, MD;  Location: [place] [facility] [facility];  Service: Cardiovascular;  Laterality: N/A;   CARDIAC CATHETERIZATION     CAROTID ENDARTERECTOMY     CORONARY ANGIOPLASTY     CORONARY STENT PLACEMENT     IR THORACENTESIS ASP PLEURAL SPACE W/IMG GUIDE  08/13/2020   PERIPHERAL VASCULAR BALLOON ANGIOPLASTY  10/15/2021   Procedure: PERIPHERAL VASCULAR BALLOON ANGIOPLASTY;  Surgeon: Wellington Hampshire, MD;  Location: [facility];  Service: Cardiovascular;;    Her Family History Is Significant For: Family History  Problem Relation Age of Onset   Alzheimer's disease Mother    Heart attack Father    Heart disease Father    Lymphoma Sister    Heart failure Brother     Her Social History Is Significant For: Social History   Socioeconomic History   Marital status: Widowed    Spouse name: Not on file   Number of children: Not on file   Years of education: Not on file   Highest education level: Not on file  Occupational History   Not on file  Tobacco Use   Smoking status: Former    Packs/day: 1.00    Years: 2.00    Pack years: 2.00    Types: Cigarettes    Quit date: 11/15/1969    Years since  quitting: 51.9   Smokeless tobacco: Never  Vaping Use   Vaping Use: Never used  Substance and Sexual Activity   Alcohol use: Never   Drug use: Never   Sexual activity: Not on file  Other Topics Concern   Not on file  Social History Narrative   Not on file   Social Determinants of Health   Financial Resource Strain: Not on file  Food Insecurity: Not on file  Transportation Needs: Not on file  Physical Activity: Not on file  Stress: Not on file  Social Connections: Not on file    Her Allergies Are:  Allergies  Allergen Reactions   Tape Other (See Comments)    SKIN IS [place]!!   Lisinopril  Cough   Morphine And Related     Narcotic pain meds/Morphine exacerbate dementia symptoms (combative)   Nitrofuran Derivatives Nausea And Vomiting and Other (See Comments)    Dizziness, also   Other     Narcotic pain meds exacerbate dementia symptoms (combative)  :   Her Current Medications Are:  Outpatient Encounter Medications as of 10/30/2021  Medication Sig   acetaminophen (TYLENOL) 500 MG tablet Take 500 mg by mouth every 6 (six) hours as needed for moderate pain.   aspirin EC 81 MG tablet Take 1 tablet (81 mg total) by mouth daily. Swallow whole.   Biotin 1000 MCG tablet Take 1,000 mcg by mouth daily.   Calcium-Magnesium-Zinc (CAL-MAG-ZINC PO) Take 1 tablet by mouth daily.   Cholecalciferol (VITAMIN D3 SUPER STRENGTH) 50 MCG (2000 UT) TABS Take 2,000 Units by mouth daily.   clopidogrel (PLAVIX) 75 MG tablet Take 75 mg by mouth every Monday, Wednesday, and Friday.    donepezil (ARICEPT) 10 MG tablet Take 1 tablet (10 mg total) by mouth at bedtime.   furosemide (LASIX) 20 MG tablet Take 1 tablet (20 mg total) by mouth daily as needed (weight gain 2-3 lbs.). (Patient taking differently: Take 20 mg by mouth See admin instructions. Take 20 mg every other day as needed for weight gain 2-3 lbs.)   levothyroxine (SYNTHROID) 25 MCG tablet Take 25 mcg by mouth  daily before breakfast.   lidocaine-prilocaine (EMLA) cream Apply 1 application topically as needed.   rosuvastatin (CRESTOR) 40 MG tablet Take 40 mg by mouth every evening.   sertraline (ZOLOFT) 100 MG tablet Take 100 mg by mouth at bedtime.   Vibegron (GEMTESA) 75 MG TABS Take 75 mg by mouth daily.   No facility-administered encounter medications on file as of 10/30/2021.  :  Review of Systems:  Out of a complete 14 point review of systems, all are reviewed and negative with the exception of these symptoms as listed below:  Review of Systems  Neurological:        Here for f/u on memory reports she feels like memory has been stable tolerating donepezil.       Objective:   Physical Exam  Physical Examination:   Vitals:   10/30/21 1314  BP: 140/60  Pulse: 72    General Examination: The patient is a very pleasant 88 y.o. female in no acute distress. She appears frail and this, well groomed.   HEENT: Normocephalic, atraumatic, pupils are equal, round and reactive to light, extraocular tracking is fairly well preserved, no nuchal rigidity.  Hearing is perhaps mildly impaired, face is symmetric, no facial masking noted.  No dysarthria, no voice tremor.  Airway examination reveals mild to moderate mouth dryness.  Tongue protrudes centrally and palate elevates symmetrically. There are no carotid bruits on auscultation.  Unremarkable scar from right carotid endarterectomy, unremarkable scar anterior neck from thyroid surgery.   Chest: Clear to auscultation without wheezing, rhonchi or crackles noted.   Heart: S1+S2+0, regular and normal without murmurs, rubs or gallops noted.    Abdomen: Soft, non-tender and non-distended.   Extremities: There is no pitting edema in the distal lower extremities bilaterally. Pedal pulses are intact.   Skin: Warm and dry without trophic changes noted.  Chronic appearing bruises in the distal upper and lower extremities.   Musculoskeletal: exam reveals  no obvious joint deformities, arthritic changes in the hands.  She has a small ulcer lateral right ankle and a healing scab medial left ankle.   Neurologically:  Mental status: The patient is awake, alert pays attention but she is not fully oriented.  Mood is normal and affect is normal.  Good spirits today. MMSE - Mini Mental State Exam 10/30/2021 06/24/2020  Orientation to time 3 0  Orientation to Place 4 4  Registration 3 3  Attention/ Calculation 1 2  Recall 2 0  Language- name 2 objects 2 2  Language- repeat 1 1  Language- follow 3 step command 3 2  Language- read & follow direction 1 1  Write a sentence 1 0  Copy design 0 0  Total score 21 15      On 06/24/2020: MMSE: 15/30, CDT: 1/4, AFT: 6/min.  On 10/30/2021: MMSE: 21/30, CDT: 1/4, AFT: 8/min.    Cranial nerves II - XII are as described above under HEENT exam. Motor exam: Thin bulk, global strength of 4 out of 5, no obvious drift or tremor.  Fine motor skills are mildly impaired globally without lateralization noted.    Cerebellar testing: No dysmetria or intention tremor. There is no truncal or gait ataxia.  Sensory exam: intact to light touch in the upper and lower extremities.  Gait, station and balance: She stands with difficulty and requires some assistance.  She uses a 2 wheeled walker.    Assessment and Plan:  In summary, Kaoir Loree is a very pleasant 88 year old female with an underlying medical history of diabetes, coronary artery disease with status post stent placements, history of MI, hypertension, hyperlipidemia, hypothyroidism, depression, osteoarthritis, carotid artery disease with status post carotid endarterectomy, history of pleural effusions, followed by pulmonology, peripheral artery disease with status post endovascular procedure in January 2023, who presents for follow-up consultation of her dementia.  She has been able to tolerate donepezil.  She has a family history of dementia.  She has multiple  vascular risk factors as well.  We talked about the importance of supportive care and fall prevention.  She is currently in home health physical therapy.  She is advised to continue with her donepezil 10 mg once daily.  We talked about the possibility of adding a second drug down the road but we mutually agreed to continue to monitor for now on monotherapy.  We talked about the importance of fall prevention as well today.  She is advised to follow-up in about 6 months to see one of our nurse practitioners. I answered all their questions today and the patient and her daughter were in agreement.  I spent 30 minutes in total face-to-face time and in reviewing records during pre-charting, more than 50% of which was spent in counseling and coordination of care, reviewing test results, reviewing medications and treatment regimen and/or in discussing or reviewing the diagnosis of dementia, the prognosis and treatment options. Pertinent laboratory and imaging test results that were available during this visit with the patient were reviewed by me and considered in my medical decision making (see chart for details).

## 2021-10-31 ENCOUNTER — Ambulatory Visit (HOSPITAL_COMMUNITY)
Admission: RE | Admit: 2021-10-31 | Discharge: 2021-10-31 | Disposition: A | Discharge status: Home / Self Care | Payer: Medicare Other | Source: Ambulatory Visit | Attending: Internal Medicine | Admitting: Internal Medicine

## 2021-10-31 ENCOUNTER — Other Ambulatory Visit: Payer: Self-pay

## 2021-10-31 DIAGNOSIS — Z9862 Peripheral vascular angioplasty status: Secondary | ICD-10-CM | POA: Diagnosis present

## 2021-10-31 DIAGNOSIS — I739 Peripheral vascular disease, unspecified: Secondary | ICD-10-CM | POA: Diagnosis present

## 2021-11-03 ENCOUNTER — Other Ambulatory Visit (HOSPITAL_COMMUNITY): Payer: Self-pay | Admitting: Cardiovascular Disease

## 2021-11-03 DIAGNOSIS — I739 Peripheral vascular disease, unspecified: Secondary | ICD-10-CM

## 2021-11-03 DIAGNOSIS — Z9862 Peripheral vascular angioplasty status: Secondary | ICD-10-CM

## 2021-11-04 ENCOUNTER — Encounter: Payer: Self-pay | Admitting: Cardiovascular Disease

## 2021-11-04 NOTE — Telephone Encounter (Signed)
Sent mychart message  to patient's daughter  We will send the message to Dr Audie Box.  Do you have any blood pressure readings , heart rate and weight for last week .  Does the swelling go down at night? Has your mother been wearing support hose during daytime hours?   You can keep her legs elevated  in the meantime until Dr Jenetta Downer' Nori Riis response.  Ivin Booty RN

## 2021-11-05 ENCOUNTER — Telehealth: Payer: Self-pay | Admitting: *Deleted

## 2021-11-05 ENCOUNTER — Ambulatory Visit (HOSPITAL_COMMUNITY)
Admission: RE | Admit: 2021-11-05 | Discharge: 2021-11-05 | Disposition: A | Discharge status: Home / Self Care | Payer: Medicare Other | Source: Ambulatory Visit | Attending: Cardiology | Admitting: Cardiology

## 2021-11-05 ENCOUNTER — Other Ambulatory Visit: Payer: Self-pay | Admitting: *Deleted

## 2021-11-05 ENCOUNTER — Other Ambulatory Visit: Payer: Self-pay

## 2021-11-05 DIAGNOSIS — M79662 Pain in left lower leg: Secondary | ICD-10-CM | POA: Insufficient documentation

## 2021-11-05 DIAGNOSIS — I739 Peripheral vascular disease, unspecified: Secondary | ICD-10-CM | POA: Diagnosis not present

## 2021-11-05 LAB — BASIC METABOLIC PANEL
BUN/Creatinine Ratio: 13 (ref 12–28)
BUN: 21 mg/dL (ref 8–27)
CO2: 31 mmol/L — ABNORMAL HIGH (ref 20–29)
Calcium: 10.3 mg/dL (ref 8.7–10.3)
Chloride: 97 mmol/L (ref 96–106)
Creatinine, Ser: 1.56 mg/dL — ABNORMAL HIGH (ref 0.57–1.00)
Glucose: 346 mg/dL — ABNORMAL HIGH (ref 70–99)
Potassium: 4.8 mmol/L (ref 3.5–5.2)
Sodium: 134 mmol/L (ref 134–144)
eGFR: 32 mL/min/{1.73_m2} — ABNORMAL LOW (ref >59)

## 2021-11-05 LAB — CBC
Hematocrit: 31.9 % — ABNORMAL LOW (ref 34.0–46.6)
Hemoglobin: 10.5 g/dL — ABNORMAL LOW (ref 11.1–15.9)
MCH: 31.9 pg (ref 26.6–33.0)
MCHC: 32.9 g/dL (ref 31.5–35.7)
MCV: 97 fL (ref 79–97)
Platelets: 289 10*3/uL (ref 150–450)
RBC: 3.29 x10E6/uL — ABNORMAL LOW (ref 3.77–5.28)
RDW: 13.2 % (ref 11.7–15.4)
WBC: 6.5 10*3/uL (ref 3.4–10.8)

## 2021-11-05 NOTE — Telephone Encounter (Signed)
Called the patient's daughter concerning her mother's leg. The daughter stated that the patient's left leg is swollen from the thigh down to her foot. She has not checked a pulse in the foot but has been educated on how to do this. The daughter was currently at work. She denies shortness of breath.  She stated that the toes on that left foot are white. She feels like the left leg may be darker than the right leg but was not sure on that. The patient stated that the left foot feels cold but when the daughter touches it she says it is warm to the touch.  The leg and foot hurts when she ambulates and she is unable to bend that leg due to the swelling and pain.   She had dopplers done on 2/10 and the patient stated that it was noticed at that visit that the left leg was swollen. The patient is 3 weeks post procedure with stent on the right side.   The daughter feels like this is getting worse. Appointment has been made with Dr. Audie Box tomorrow. Patient's daughter made aware of ED precautions should new or worsening symptoms occur. She has verbalized her understanding.

## 2021-11-05 NOTE — Telephone Encounter (Signed)
STAT labs and STAT venous doppler have been ordered per Dr. Fletcher Anon. Patient's daughter has been made aware and will arrive by 12:15 today.

## 2021-11-06 ENCOUNTER — Ambulatory Visit (INDEPENDENT_AMBULATORY_CARE_PROVIDER_SITE_OTHER): Payer: Medicare Other | Admitting: Cardiovascular Disease

## 2021-11-06 ENCOUNTER — Encounter: Payer: Self-pay | Admitting: Cardiovascular Disease

## 2021-11-06 VITALS — BP 155/68 | HR 66 | Ht 67.0 in | Wt 103.0 lb

## 2021-11-06 DIAGNOSIS — M79605 Pain in left leg: Secondary | ICD-10-CM

## 2021-11-06 DIAGNOSIS — E782 Mixed hyperlipidemia: Secondary | ICD-10-CM

## 2021-11-06 DIAGNOSIS — I5032 Chronic diastolic (congestive) heart failure: Secondary | ICD-10-CM

## 2021-11-06 DIAGNOSIS — I739 Peripheral vascular disease, unspecified: Secondary | ICD-10-CM

## 2021-11-06 DIAGNOSIS — R600 Localized edema: Secondary | ICD-10-CM | POA: Diagnosis not present

## 2021-11-06 DIAGNOSIS — E118 Type 2 diabetes mellitus with unspecified complications: Secondary | ICD-10-CM

## 2021-11-06 MED ORDER — DOXYCYCLINE HYCLATE 100 MG PO CAPS: 100.0000 mg | ORAL_CAPSULE | Freq: Two times a day (BID) | ORAL | 0 refills | Status: DC

## 2021-11-06 MED ORDER — METFORMIN HCL 500 MG PO TABS: 500.0000 mg | ORAL_TABLET | Freq: Two times a day (BID) | ORAL | 1 refills | Status: DC

## 2021-11-06 NOTE — Patient Instructions (Signed)
Medication Instructions:  START Doxycycline 100 mg twice daily for 7 days.  START Metformin 500 twice daily  *If you need a refill on your cardiac medications before your next appointment, please call your pharmacy*  Follow-Up: At [facility], you and your health needs are our priority.  As part of our continuing mission to provide you with exceptional heart care, we have created designated Provider Care Teams.  These Care Teams include your primary Cardiologist (physician) and Advanced Practice Providers (APPs -  Physician Assistants and Nurse Practitioners) who all work together to provide you with the care you need, when you need it.  We recommend signing up for the patient portal called "MyChart".  Sign up information is provided on this After Visit Summary.  MyChart is used to connect with patients for Virtual Visits (Telemedicine).  Patients are able to view lab/test results, encounter notes, upcoming appointments, etc.  Non-urgent messages can be sent to your provider as well.   To learn more about what you can do with MyChart, go to [email or web address].    Your next appointment:   6 month(s)  The format for your next appointment:   In Person  Provider:   Evalina Field, MD

## 2021-11-06 NOTE — Progress Notes (Signed)
Cardiology Office Note:   Date:  11/06/2021  NAME:  [name]    MRN: [id number] DOB:  Feb 18, 1932   PCP:  Michael Boston, MD  Cardiologist:  Evalina Field, MD  Electrophysiologist:  None   Referring MD: Michael Boston, MD   Chief Complaint  Patient presents with   Follow-up   History of Present Illness:   [name] is a 88 y.o. female with a hx of PAD, CAD, HFpEF, dementia who presents for follow-up. Had poor wound healing in the lower extremities. [place] identified severe disease in the R popliteal artery. She underwent angioplasty 10/15/2021 to the R popliteal artery. Via L femoral access. Reports for 1 week has had isolated swelling in the L ankle. Also has severe shooting pain in both legs. Good pulses today. L femoral artery is 2+ without bruising. Denies CP or SOB. No fevers or chills. Labs unremarkable. DVT study performed that was negative yesterday. Arterial ultrasound shows improved blood flow in the LEs. Weights are stable. No increased volume. Denies CP or SOB. Glucose very elevated. No on DM meds.   Problem List 1. Recurrent L pleural effusion  -s/p pleurex catheter 2.  CAD  -status post stent in 2007/2012  3.  Hypertension 4.  Hyperlipidemia -Total cholesterol 159, HDL 57, LDL 84, triglycerides 92 5.  Arthritis 6.  Esophageal dilation 7.  Carotid artery stenosis status post right CEA -L ICA 40-59% 8. DM -A1c 6.9 9. HFpEF -EF 55-60% -BNP 1055 10. Dementia  11. PAD -angioplasty R popliteal artery 10/15/2021  Past Medical History: Past Medical History:  Diagnosis Date   Coronary artery disease    DM (diabetes mellitus) ([place])    HTN (hypertension)    Hyperlipidemia    Myocardial infarct [facility]) 2012   Renal disorder     Past Surgical History: Past Surgical History:  Procedure Laterality Date   ABDOMINAL AORTOGRAM W/LOWER EXTREMITY N/A 10/15/2021   Procedure: ABDOMINAL AORTOGRAM W/LOWER EXTREMITY;  Surgeon: Wellington Hampshire, MD;  Location: [place] [facility];  Service: Cardiovascular;  Laterality: N/A;   CARDIAC CATHETERIZATION     CAROTID ENDARTERECTOMY     CORONARY ANGIOPLASTY     CORONARY STENT PLACEMENT     IR THORACENTESIS ASP PLEURAL SPACE W/IMG GUIDE  08/13/2020   PERIPHERAL VASCULAR BALLOON ANGIOPLASTY  10/15/2021   Procedure: PERIPHERAL VASCULAR BALLOON ANGIOPLASTY;  Surgeon: Wellington Hampshire, MD;  Location: [place] [facility];  Service: Cardiovascular;;    Current Medications: Current Meds  Medication Sig   acetaminophen (TYLENOL) 500 MG tablet Take 500 mg by mouth every 6 (six) hours as needed for moderate pain.   aspirin EC 81 MG tablet Take 1 tablet (81 mg total) by mouth daily. Swallow whole.   Biotin 1000 MCG tablet Take 1,000 mcg by mouth daily.   Calcium-Magnesium-Zinc (CAL-MAG-ZINC PO) Take 1 tablet by mouth daily.   Cholecalciferol (VITAMIN D3 SUPER STRENGTH) 50 MCG (2000 UT) TABS Take 2,000 Units by mouth daily.   clopidogrel (PLAVIX) 75 MG tablet Take 75 mg by mouth every Monday, Wednesday, and Friday.    donepezil (ARICEPT) 10 MG tablet Take 1 tablet (10 mg total) by mouth at bedtime.   doxycycline (VIBRAMYCIN) 100 MG capsule Take 1 capsule (100 mg total) by mouth 2 (two) times daily.   furosemide (LASIX) 20 MG tablet Take 1 tablet (20 mg total) by mouth daily as needed (weight gain 2-3 lbs.). (Patient taking differently: Take 20 mg by mouth See admin instructions. Take  20 mg every other day as needed for weight gain 2-3 lbs.)   levothyroxine (SYNTHROID) 25 MCG tablet Take 25 mcg by mouth daily before breakfast.   lidocaine-prilocaine (EMLA) cream Apply 1 application topically as needed.   metFORMIN (GLUCOPHAGE) 500 MG tablet Take 1 tablet (500 mg total) by mouth 2 (two) times daily with a meal.   rosuvastatin (CRESTOR) 40 MG tablet Take 40 mg by mouth every evening.   sertraline (ZOLOFT) 100 MG tablet Take 100 mg by mouth at bedtime.   Vibegron (GEMTESA) 75 MG TABS Take 75 mg by mouth daily.      Allergies:    Tape, Lisinopril, Morphine and related, Nitrofuran derivatives, and Other   Social History: Social History   Socioeconomic History   Marital status: Widowed    Spouse name: Not on file   Number of children: Not on file   Years of education: Not on file   Highest education level: Not on file  Occupational History   Not on file  Tobacco Use   Smoking status: Former    Packs/day: 1.00    Years: 2.00    Pack years: 2.00    Types: Cigarettes    Quit date: 11/15/1969    Years since quitting: 52.0   Smokeless tobacco: Never  Vaping Use   Vaping Use: Never used  Substance and Sexual Activity   Alcohol use: Never   Drug use: Never   Sexual activity: Not on file  Other Topics Concern   Not on file  Social History Narrative   Not on file   Social Determinants of Health   Financial Resource Strain: Not on file  Food Insecurity: Not on file  Transportation Needs: Not on file  Physical Activity: Not on file  Stress: Not on file  Social Connections: Not on file     Family History: The patient's family history includes Alzheimer's disease in her mother; Heart attack in her father; Heart disease in her father; Heart failure in her brother; Lymphoma in her sister.  ROS:   All other ROS reviewed and negative. Pertinent positives noted in the HPI.     EKGs/Labs/Other Studies Reviewed:   The following studies were personally reviewed by me today:  Vasc US 10/31/2021 Right: Moderate improvement is noted compared to previous study.  Heterogeneous plaque throughout.  Decrease velocities in the distal popliteal artery, now 50-74% stenosis,  low end range, s/p angioplasty.  Widely patent peroneal artery without evidence of stenosis, s/p  angioplasty to the proximal segment.  DVT US 09/04/2022 Summary:  RIGHT:  - No evidence of common femoral vein obstruction.     LEFT:  - No evidence of deep vein thrombosis in the lower extremity. No indirect  evidence of  obstruction proximal to the inguinal ligament.  - No cystic structure found in the popliteal fossa.   Recent Labs: 11/05/2021: BUN 21; Creatinine, Ser 1.56; Hemoglobin 10.5; Platelets 289; Potassium 4.8; Sodium 134   Recent Lipid Panel No results found for: CHOL, TRIG, HDL, CHOLHDL, VLDL, LDLCALC, LDLDIRECT  Physical Exam:   VS:  BP (!) 155/68    Pulse 66    Ht 5\' 7"  (1.702 m)    Wt 103 lb (46.7 kg)    SpO2 98%    BMI 16.13 kg/m    Wt Readings from Last 3 Encounters:  11/06/21 103 lb (46.7 kg)  10/30/21 105 lb (47.6 kg)  10/15/21 102 lb (46.3 kg)    General: Well nourished, well developed, in no  acute distress Head: Atraumatic, normal size  Eyes: PEERLA, EOMI  Neck: Supple, no JVD Endocrine: No thryomegaly Cardiac: Normal S1, S2; RRR; no murmurs, rubs, or gallops Lungs: Clear to auscultation bilaterally, no wheezing, rhonchi or rales  Abd: Soft, nontender, no hepatomegaly  Ext: No edema, pulses 2+ Musculoskeletal: No deformities, BUE and BLE strength normal and equal Skin: Warm and dry, no rashes   Neuro: Alert and oriented to person, place, time, and situation, CNII-XII grossly intact, no focal deficits  Psych: Normal mood and affect   ASSESSMENT:   John Vasconcelos is a 88 y.o. female who presents for the following: 1. Pain of left lower extremity   2. Leg edema, left   3. PAD (peripheral artery disease) ([place])   4. Mixed hyperlipidemia   5. Chronic diastolic heart failure ([place])   6. Type 2 diabetes mellitus with complication, without long-term current use of insulin ([place])     PLAN:   1. Pain of left lower extremity 2. Leg edema, left -Isolated swelling in the L ankle. Good pulses in L femoral artery and DP/PT pulses in LLE. No signs of CHF. DVT study negative. Labs unremarkable.  -Extremity is warm with open wounds. Will treat for cellulitis, 100 mg BID doxycycline.  -symptoms of leg pain sounds like neuropathy. Will add back metformin for DM.   3. PAD (peripheral  artery disease) ([place]) 4. Mixed hyperlipidemia -poor wound healing led to Vasc US to find PAD.  -angioplasty R popliteal artery 10/15/2021 -no signs of worsening PAD. Good pulses bilaterally.  -continue ASA/plavix per vascular.  -on statin.   5. Chronic diastolic heart failure (HCC) -euvolemic on exam. Continue with torsemide 20 mg daily PRN. No signs of worsening CHF.   6. Type 2 diabetes mellitus with complication, without long-term current use of insulin (HCC) -restart metformin 500 mg BID.  -discuss further with PCP.   Disposition: Return in about 6 months (around 05/06/2022).  Medication Adjustments/Labs and Tests Ordered: Current medicines are reviewed at length with the patient today.  Concerns regarding medicines are outlined above.  No orders of the defined types were placed in this encounter.  Meds ordered this encounter  Medications   doxycycline (VIBRAMYCIN) 100 MG capsule    Sig: Take 1 capsule (100 mg total) by mouth 2 (two) times daily.    Dispense:  14 capsule    Refill:  0   metFORMIN (GLUCOPHAGE) 500 MG tablet    Sig: Take 1 tablet (500 mg total) by mouth 2 (two) times daily with a meal.    Dispense:  180 tablet    Refill:  1    Patient Instructions  Medication Instructions:  START Doxycycline 100 mg twice daily for 7 days.  START Metformin 500 twice daily  *If you need a refill on your cardiac medications before your next appointment, please call your pharmacy*  Follow-Up: At [facility], you and your health needs are our priority.  As part of our continuing mission to provide you with exceptional heart care, we have created designated Provider Care Teams.  These Care Teams include your primary Cardiologist (physician) and Advanced Practice Providers (APPs -  Physician Assistants and Nurse Practitioners) who all work together to provide you with the care you need, when you need it.  We recommend signing up for the patient portal called "MyChart".  Sign up  information is provided on this After Visit Summary.  MyChart is used to connect with patients for Virtual Visits (Telemedicine).  Patients are able  to view lab/test results, encounter notes, upcoming appointments, etc.  Non-urgent messages can be sent to your provider as well.   To learn more about what you can do with MyChart, go to [email or web address].    Your next appointment:   6 month(s)  The format for your next appointment:   In Person  Provider:   Evalina Field, MD       Time Spent with Patient: I have spent a total of 35 minutes with patient reviewing hospital notes, telemetry, EKGs, labs and examining the patient as well as establishing an assessment and plan that was discussed with the patient.  > 50% of time was spent in direct patient care.  Signed, Addison Naegeli. Audie Box, MD, [place]  [address], [place], [place] [address] [phone]  11/06/2021 12:51 PM

## 2021-12-01 NOTE — Progress Notes (Unsigned)
Cardiology Office Note   Date:  12/01/2021   ID:  [name], DOB [birth year], MRN [id number]  PCP:  Michael Boston, MD  Cardiologist: Dr. Audie Box  No chief complaint on file.     History of Present Illness: [name] is a 88 y.o. female who was referred by Dr. Sherryle Lis for evaluation management of peripheral arterial disease. She has known history of chronic diastolic heart failure, coronary artery disease, carotid artery disease, chronic kidney disease, mild dementia, essential hypertension and hyperlipidemia.  She is status post right CEA.  She developed a pressure ulcer on the right lateral ankle in February of last year that has not healed since then.  If anything, it has gotten worse with increased discomfort.  She does complain of bilateral leg pain at night mostly affecting the right leg.  She underwent recent noninvasive vascular studies which showed an ABI of 0.93 on the right and 1.26 on the left.  Duplex showed severe stenosis in the right TP trunk with two-vessel runoff below the knee with an occluded mid posterior tibial artery.  She has dementia but it is overall mild.  She denies chest pain or shortness of breath.  Past Medical History:  Diagnosis Date   Coronary artery disease    DM (diabetes mellitus) ([place])    HTN (hypertension)    Hyperlipidemia    Myocardial infarct [facility]) 2012   Renal disorder     Past Surgical History:  Procedure Laterality Date   ABDOMINAL AORTOGRAM W/LOWER EXTREMITY N/A 10/15/2021   Procedure: ABDOMINAL AORTOGRAM W/LOWER EXTREMITY;  Surgeon: Wellington Hampshire, MD;  Location: [place] [facility];  Service: Cardiovascular;  Laterality: N/A;   CARDIAC CATHETERIZATION     CAROTID ENDARTERECTOMY     CORONARY ANGIOPLASTY     CORONARY STENT PLACEMENT     IR THORACENTESIS ASP PLEURAL SPACE W/IMG GUIDE  08/13/2020   PERIPHERAL VASCULAR BALLOON ANGIOPLASTY  10/15/2021   Procedure: PERIPHERAL VASCULAR BALLOON ANGIOPLASTY;  Surgeon:  Wellington Hampshire, MD;  Location: [place] [facility];  Service: Cardiovascular;;     Current Outpatient Medications  Medication Sig Dispense Refill   acetaminophen (TYLENOL) 500 MG tablet Take 500 mg by mouth every 6 (six) hours as needed for moderate pain.     aspirin EC 81 MG tablet Take 1 tablet (81 mg total) by mouth daily. Swallow whole. 90 tablet 3   Biotin 1000 MCG tablet Take 1,000 mcg by mouth daily.     Calcium-Magnesium-Zinc (CAL-MAG-ZINC PO) Take 1 tablet by mouth daily.     Cholecalciferol (VITAMIN D3 SUPER STRENGTH) 50 MCG (2000 UT) TABS Take 2,000 Units by mouth daily.     clopidogrel (PLAVIX) 75 MG tablet Take 75 mg by mouth every Monday, Wednesday, and Friday.      donepezil (ARICEPT) 10 MG tablet Take 1 tablet (10 mg total) by mouth at bedtime. 90 tablet 3   doxycycline (VIBRAMYCIN) 100 MG capsule Take 1 capsule (100 mg total) by mouth 2 (two) times daily. 14 capsule 0   furosemide (LASIX) 20 MG tablet Take 1 tablet (20 mg total) by mouth daily as needed (weight gain 2-3 lbs.). (Patient taking differently: Take 20 mg by mouth See admin instructions. Take 20 mg every other day as needed for weight gain 2-3 lbs.) 30 tablet 3   levothyroxine (SYNTHROID) 25 MCG tablet Take 25 mcg by mouth daily before breakfast.     lidocaine-prilocaine (EMLA) cream Apply 1 application topically as needed. 30 g 2  metFORMIN (GLUCOPHAGE) 500 MG tablet Take 1 tablet (500 mg total) by mouth 2 (two) times daily with a meal. 180 tablet 1   rosuvastatin (CRESTOR) 40 MG tablet Take 40 mg by mouth every evening.     sertraline (ZOLOFT) 100 MG tablet Take 100 mg by mouth at bedtime.     Vibegron (GEMTESA) 75 MG TABS Take 75 mg by mouth daily.     No current facility-administered medications for this visit.    Allergies:   Tape, Lisinopril, Morphine and related, Nitrofuran derivatives, and Other    Social History:  The patient  reports that she quit smoking about 52 years ago. Her smoking use  included cigarettes. She has a 2.00 pack-year smoking history. She has never used smokeless tobacco. She reports that she does not drink alcohol and does not use drugs.   Family History:  The patient's family history includes Alzheimer's disease in her mother; Heart attack in her father; Heart disease in her father; Heart failure in her brother; Lymphoma in her sister.    ROS:  Please see the history of present illness.   Otherwise, review of systems are positive for none.   All other systems are reviewed and negative.    PHYSICAL EXAM: VS:  There were no vitals taken for this visit. , BMI There is no height or weight on file to calculate BMI. GEN: Well nourished, well developed, in no acute distress  HEENT: normal  Neck: no JVD, carotid bruits, or masses Cardiac: RRR; no murmurs, rubs, or gallops,no edema  Respiratory:  clear to auscultation bilaterally, normal work of breathing GI: soft, nontender, nondistended, + BS MS: no deformity or atrophy  Skin: warm and dry, no rash Neuro:  Strength and sensation are intact Psych: euthymic mood, full affect   EKG:  EKG is ordered today. The ekg ordered today demonstrates sinus rhythm with a PVC.   Recent Labs: 11/05/2021: BUN 21; Creatinine, Ser 1.56; Hemoglobin 10.5; Platelets 289; Potassium 4.8; Sodium 134    Lipid Panel No results found for: CHOL, TRIG, HDL, CHOLHDL, VLDL, LDLCALC, LDLDIRECT    Wt Readings from Last 3 Encounters:  11/06/21 103 lb (46.7 kg)  10/30/21 105 lb (47.6 kg)  10/15/21 102 lb (46.3 kg)       No flowsheet data found.    ASSESSMENT AND PLAN:  1.  Peripheral arterial disease: The patient has nonhealing pressure ulceration affecting the right lateral ankle.  There is evidence of significant peripheral arterial disease which is likely contributing to poor healing.  I discussed with the patient and her daughter the natural history and management of peripheral arterial disease.  Due to poor tissue healing, I  have suggested proceeding with abdominal aortogram with lower extremity runoff and possible endovascular intervention.  I discussed the procedure in details as well as risks and benefits.  The patient does have chronic kidney disease and is at higher risk for contrast-induced nephropathy.  She will require preprocedure hydration.  The patient wants to think about this and discuss with her other children before deciding.  I asked her to start aspirin 81 mg daily.  2.  Chronic diastolic heart failure: She appears to be euvolemic.  3.  Coronary artery disease involving native coronary arteries without angina: Currently with no anginal symptoms.  4.  Carotid artery disease: Stable overall.  5.  Hyperlipidemia: Continue rosuvastatin with a target LDL of less than 70.    Disposition:   The patient and family will call us if  they decide to proceed with an angiogram.  Signed,  [name], MD  12/01/2021 5:19 PM    [place]

## 2021-12-02 ENCOUNTER — Other Ambulatory Visit: Payer: Self-pay

## 2021-12-02 ENCOUNTER — Encounter: Payer: Self-pay | Admitting: Cardiovascular Disease

## 2021-12-02 ENCOUNTER — Ambulatory Visit (INDEPENDENT_AMBULATORY_CARE_PROVIDER_SITE_OTHER): Payer: Medicare Other | Admitting: Cardiovascular Disease

## 2021-12-02 VITALS — BP 116/48 | HR 67 | Ht 67.0 in | Wt 108.2 lb

## 2021-12-02 DIAGNOSIS — I739 Peripheral vascular disease, unspecified: Secondary | ICD-10-CM | POA: Diagnosis not present

## 2021-12-02 DIAGNOSIS — I6523 Occlusion and stenosis of bilateral carotid arteries: Secondary | ICD-10-CM | POA: Diagnosis not present

## 2021-12-02 DIAGNOSIS — E785 Hyperlipidemia, unspecified: Secondary | ICD-10-CM

## 2021-12-02 DIAGNOSIS — I2510 Atherosclerotic heart disease of native coronary artery without angina pectoris: Secondary | ICD-10-CM

## 2021-12-02 DIAGNOSIS — I5032 Chronic diastolic (congestive) heart failure: Secondary | ICD-10-CM

## 2021-12-02 NOTE — Patient Instructions (Signed)
Medication Instructions:  ?No changes ?*If you need a refill on your cardiac medications before your next appointment, please call your pharmacy* ? ? ?Lab Work: ?None ordered. ?If you have labs (blood work) drawn today and your tests are completely normal, you will receive your results only by: ?MyChart Message (if you have MyChart) OR ?A paper copy in the mail ?If you have any lab test that is abnormal or we need to change your treatment, we will call you to review the results. ? ? ?Testing/Procedures: ?Your physician has requested that you have an ankle brachial index (ABI) in August. During this test an ultrasound and blood pressure cuff are used to evaluate the arteries that supply the arms and legs with blood. Allow thirty minutes for this exam. There are no restrictions or special instructions. This will take place at [place], [address].  ? ?Your physician has requested that you have a lower extremity arterial duplex in August. During this test, ultrasound is used to evaluate arterial blood flow in the legs. Allow one hour for this exam. There are no restrictions or special instructions. This will take place at [place], [address]. ? ?Follow-Up: ?At [facility], you and your health needs are our priority.  As part of our continuing mission to provide you with exceptional heart care, we have created designated Provider Care Teams.  These Care Teams include your primary Cardiologist (physician) and Advanced Practice Providers (APPs -  Physician Assistants and Nurse Practitioners) who all work together to provide you with the care you need, when you need it. ? ?We recommend signing up for the patient portal called "MyChart".  Sign up information is provided on this After Visit Summary.  MyChart is used to connect with patients for Virtual Visits (Telemedicine).  Patients are able to view lab/test results, encounter notes, upcoming appointments, etc.  Non-urgent messages can be sent to your  provider as well.   ?To learn more about what you can do with MyChart, go to [email or web address].   ? ?Your next appointment:   ?6 month(s) ? ?The format for your next appointment:   ?In Person ? ?Provider:   ?Dr. Fletcher Anon ? ?

## 2022-01-19 ENCOUNTER — Other Ambulatory Visit: Payer: Self-pay | Admitting: *Deleted

## 2022-01-19 DIAGNOSIS — F0390 Unspecified dementia without behavioral disturbance: Secondary | ICD-10-CM

## 2022-01-19 MED ORDER — DONEPEZIL HCL 10 MG PO TABS: 10.0000 mg | ORAL_TABLET | Freq: Every day | ORAL | 2 refills | Status: DC

## 2022-01-23 ENCOUNTER — Emergency Department (HOSPITAL_BASED_OUTPATIENT_CLINIC_OR_DEPARTMENT_OTHER): Payer: Medicare Other | Admitting: Radiology

## 2022-01-23 ENCOUNTER — Emergency Department (HOSPITAL_BASED_OUTPATIENT_CLINIC_OR_DEPARTMENT_OTHER)
Admission: EM | Admit: 2022-01-23 | Discharge: 2022-01-23 | Disposition: A | Discharge status: Home / Self Care | Payer: Medicare Other | Attending: Emergency Medicine | Admitting: Emergency Medicine

## 2022-01-23 ENCOUNTER — Other Ambulatory Visit: Payer: Self-pay

## 2022-01-23 ENCOUNTER — Encounter (HOSPITAL_BASED_OUTPATIENT_CLINIC_OR_DEPARTMENT_OTHER): Payer: Self-pay

## 2022-01-23 DIAGNOSIS — Z7982 Long term (current) use of aspirin: Secondary | ICD-10-CM | POA: Diagnosis not present

## 2022-01-23 DIAGNOSIS — Z79899 Other long term (current) drug therapy: Secondary | ICD-10-CM | POA: Diagnosis not present

## 2022-01-23 DIAGNOSIS — Z7984 Long term (current) use of oral hypoglycemic drugs: Secondary | ICD-10-CM | POA: Insufficient documentation

## 2022-01-23 DIAGNOSIS — S5011XA Contusion of right forearm, initial encounter: Secondary | ICD-10-CM

## 2022-01-23 DIAGNOSIS — Z7901 Long term (current) use of anticoagulants: Secondary | ICD-10-CM | POA: Diagnosis not present

## 2022-01-23 DIAGNOSIS — W19XXXA Unspecified fall, initial encounter: Secondary | ICD-10-CM | POA: Diagnosis not present

## 2022-01-23 DIAGNOSIS — S51011A Laceration without foreign body of right elbow, initial encounter: Secondary | ICD-10-CM | POA: Insufficient documentation

## 2022-01-23 DIAGNOSIS — S59901A Unspecified injury of right elbow, initial encounter: Secondary | ICD-10-CM | POA: Diagnosis present

## 2022-01-23 IMAGING — DX DG WRIST COMPLETE 3+V*R*
4 series · 4 of 4 positions shown · non-contrast
Comparison: Right forearm radiographs [DATE]

CLINICAL DATA: Right wrist pain from fall. Large hematoma and
distal posterior forearm.

EXAM:
RIGHT WRIST - COMPLETE 3+ VIEW

[wrist ap]
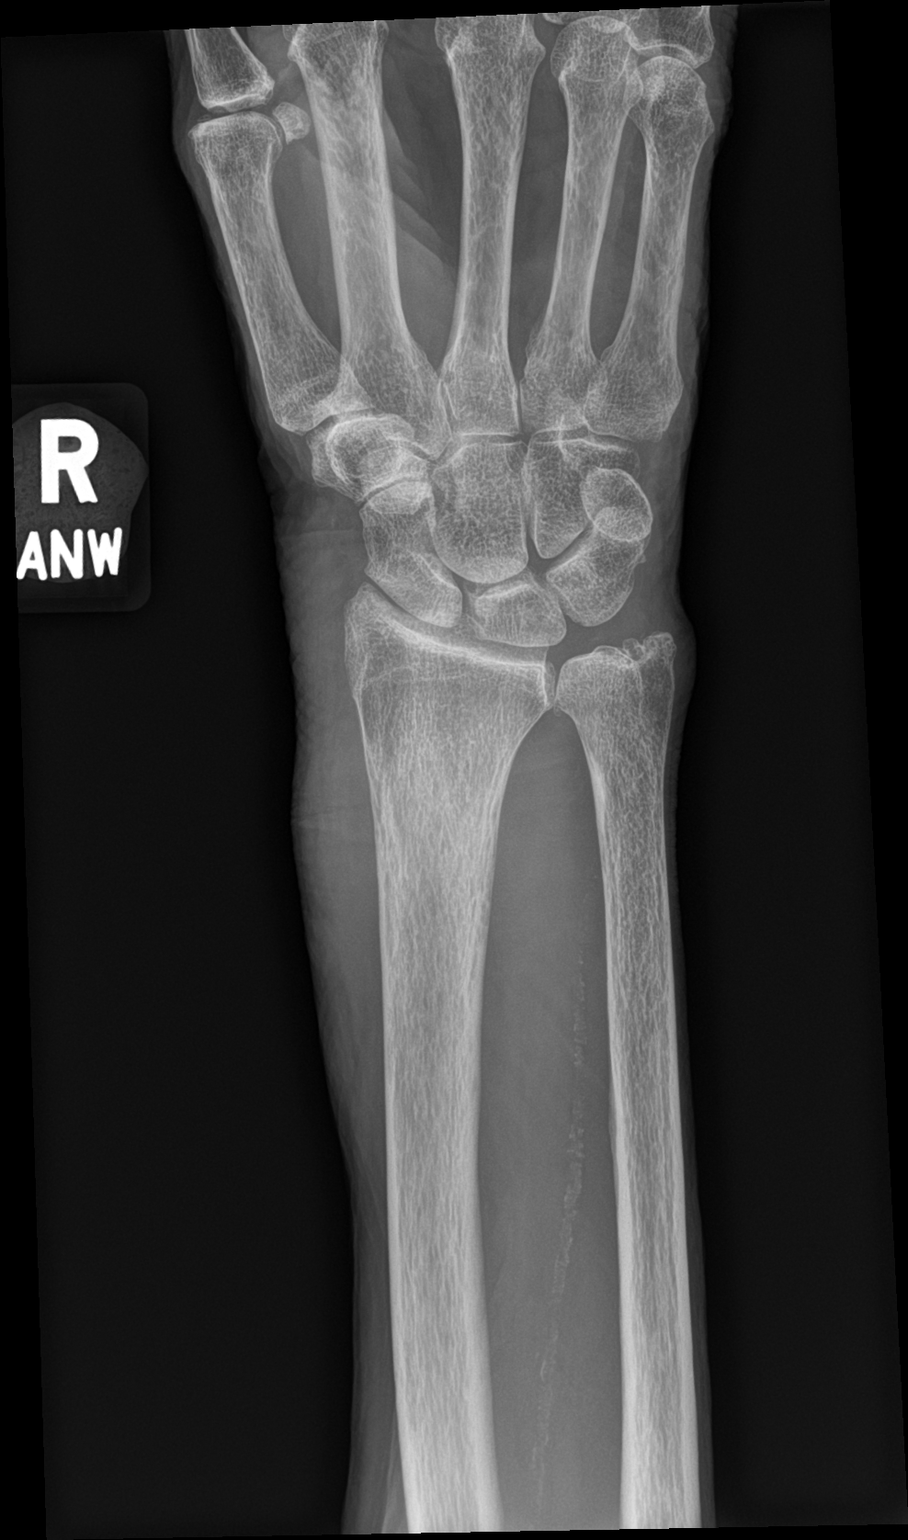

[wrist obl]
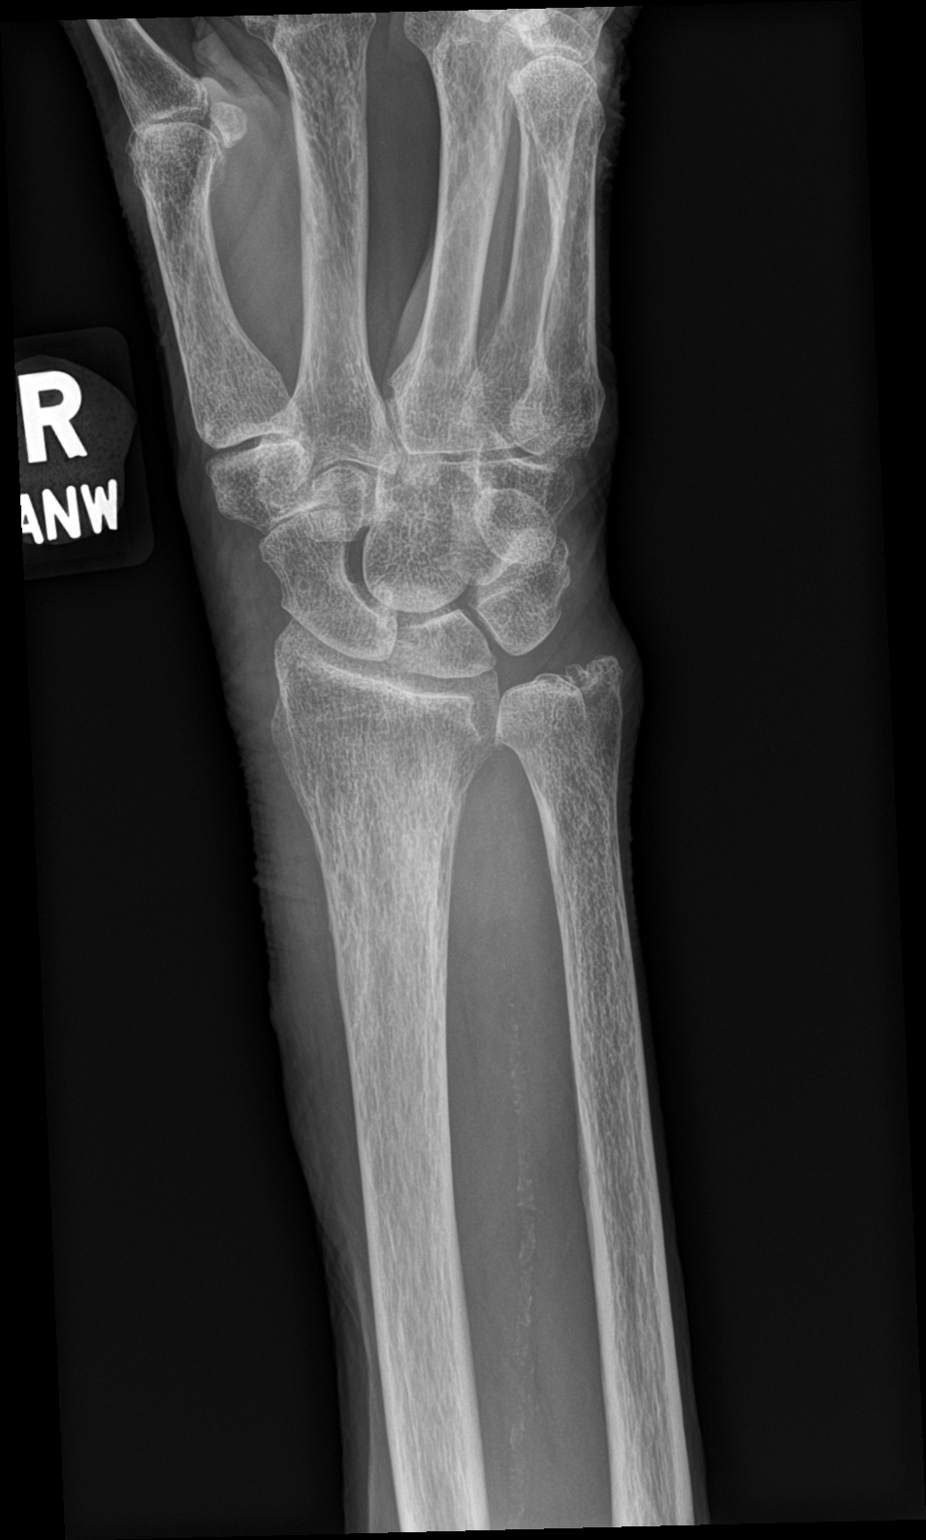

[wrist lat]
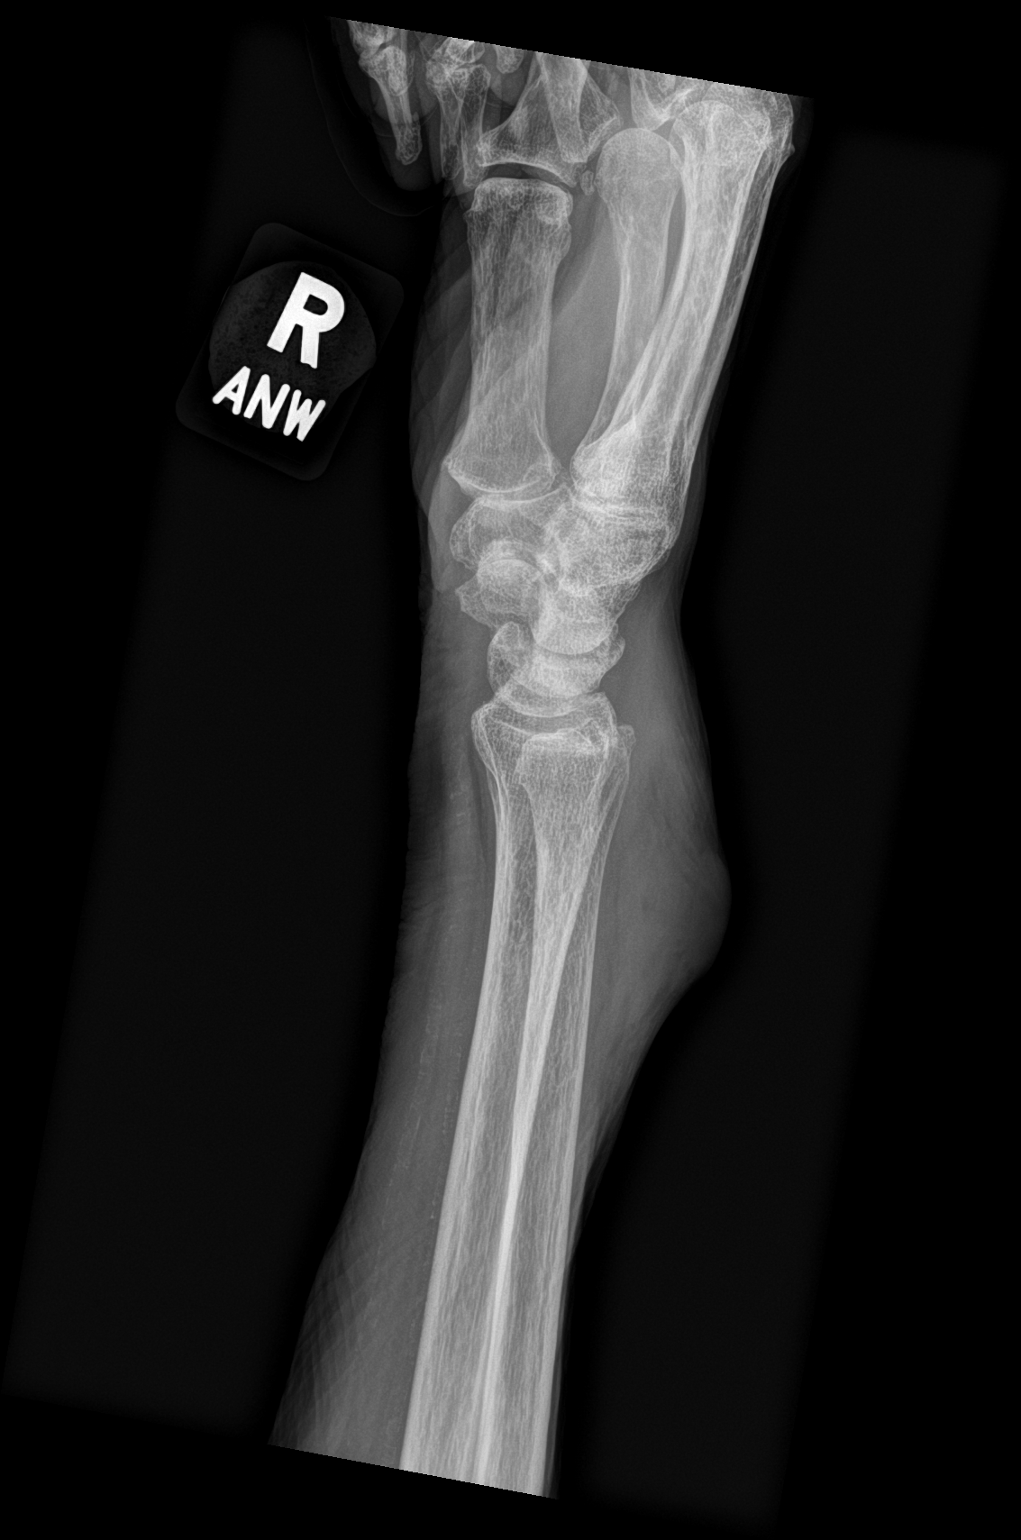

[wrist navicular]
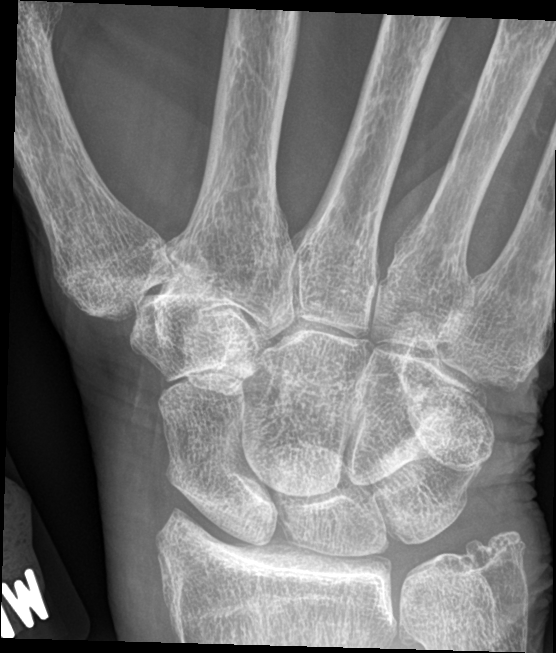

[4 of 4 positions shown; findings below may reference images not displayed]

FINDINGS: Mildly decreased bone mineralization. Neutral ulnar variance.
Moderate triscaphe and mild thumb carpometacarpal joint space
narrowing. Mild distal radioulnar joint degenerative osteophytosis.
No acute fracture is seen. No dislocation. Large distal dorsal
forearm hematoma with dorsal bulging measuring up to approximately
1.7 cm in AP dimension at the level of the distal radial
metadiaphysis. Moderate associated hematoma/swelling of the lateral
aspect of the distal forearm.
IMPRESSION: :
IMPRESSION: 1. Large distal dorsal forearm hematoma.
2. No acute fracture is seen.
3. Osteoarthritis greatest at the triscaphe joint.

## 2022-01-23 NOTE — ED Triage Notes (Signed)
Patient here POV from Home. ? ?Endorses falling at Home approximately 1 hour PTA. Endorses being on a New Medication for a few days and due to Dizziness Side Effect she has had Intermittent Dizziness. Patient went to use the Restroom and fell due to same. ? ?Small Skin Tear to Right Posterior Proximal Forearm and Severe Swelling/Bruising to Right Distal Forearm and Wrist. ? ?Patient does take Plavix. No head Injury or Other Injuries noted. No LOC.  ? ?NAD Noted during Triage. A&Ox4. GCS 15 BIB Personal Wheelchair. ?

## 2022-01-23 NOTE — ED Notes (Signed)
Wound cleaned dressed and steri strips applied  ?

## 2022-01-23 NOTE — ED Provider Notes (Signed)
?[name] ?Provider Note ? ? ?CSN: [id number] ?Arrival date & time: 01/23/22  1310 ? ?  ? ?History ? ?Chief Complaint  ?Patient presents with  ? Fall  ? ? ?[name] is a 88 y.o. female. ? ?88 year old female presents after becoming dizzy and having a fall today.  Patient started a new medication for a urological condition and when she stood up from the toilet became dizzy and lightheaded and fell onto her right arm.  Patient is on Plavix but did not strike her head.  No loss of consciousness.  No neck pain.  Complains of pain to her right distal forearm.  Denies any lumbar sacral discomfort.  No weakness or pain below the waist.  No distal numbness or tingling to her right hand ? ? ?  ? ?Home Medications ?Prior to Admission medications   ?Medication Sig Start Date End Date Taking? Authorizing Provider  ?acetaminophen (TYLENOL) 500 MG tablet Take 500 mg by mouth every 6 (six) hours as needed for moderate pain.    [provider]  ?aspirin EC 81 MG tablet Take 1 tablet (81 mg total) by mouth daily. Swallow whole. 09/23/21   Wellington Hampshire, MD  ?Biotin 1000 MCG tablet Take 1,000 mcg by mouth daily.    [provider]  ?Calcium-Magnesium-Zinc (CAL-MAG-ZINC PO) Take 1 tablet by mouth daily.    [provider]  ?Cholecalciferol (VITAMIN D3 SUPER STRENGTH) 50 MCG (2000 UT) TABS Take 2,000 Units by mouth daily.    [provider]  ?clopidogrel (PLAVIX) 75 MG tablet Take 75 mg by mouth every Monday, Wednesday, and Friday.     [provider]  ?donepezil (ARICEPT) 10 MG tablet Take 1 tablet (10 mg total) by mouth at bedtime. 01/19/22   Star Age, MD  ?doxycycline (VIBRAMYCIN) 100 MG capsule Take 1 capsule (100 mg total) by mouth 2 (two) times daily. ?Patient not taking: Reported on 12/02/2021 11/06/21   Geralynn Rile, MD  ?furosemide (LASIX) 20 MG tablet Take 1 tablet (20 mg total) by mouth daily as needed (weight gain 2-3  lbs.). ?Patient taking differently: Take 20 mg by mouth See admin instructions. Take 20 mg every other day as needed for weight gain 2-3 lbs. 12/06/20   O'Neal, Cassie Freer, MD  ?levothyroxine (SYNTHROID) 25 MCG tablet Take 25 mcg by mouth daily before breakfast.    [provider]  ?lidocaine-prilocaine (EMLA) cream Apply 1 application topically as needed. ?Patient not taking: Reported on 12/02/2021 09/09/21   Criselda Peaches, DPM  ?metFORMIN (GLUCOPHAGE) 500 MG tablet Take 1 tablet (500 mg total) by mouth 2 (two) times daily with a meal. ?Patient not taking: Reported on 12/02/2021 11/06/21   Geralynn Rile, MD  ?rosuvastatin (CRESTOR) 40 MG tablet Take 40 mg by mouth every evening.    [provider]  ?sertraline (ZOLOFT) 100 MG tablet Take 100 mg by mouth at bedtime.    [provider]  ?Vibegron (GEMTESA) 75 MG TABS Take 75 mg by mouth daily.    [provider]  ?   ? ?Allergies    ?Tape, Lisinopril, Morphine and related, Nitrofuran derivatives, and Other   ? ?Review of Systems   ?Review of Systems  ?All other systems reviewed and are negative. ? ?Physical Exam ?Updated Vital Signs ?BP 140/70 (BP Location: Left Arm)   Pulse 73   Temp 98.1 ?F (36.7 ?C)   Resp 18   Ht 1.702 m ('5\' 7"'$ )   Wt 49.1  kg   SpO2 100%   BMI 16.95 kg/m?  ?Physical Exam ?Vitals and nursing note reviewed.  ?Constitutional:   ?   General: She is not in acute distress. ?   Appearance: Normal appearance. She is well-developed. She is not toxic-appearing.  ?HENT:  ?   Head: Normocephalic and atraumatic.  ?Eyes:  ?   General: Lids are normal.  ?   Conjunctiva/sclera: Conjunctivae normal.  ?   Pupils: Pupils are equal, round, and reactive to light.  ?Neck:  ?   Thyroid: No thyroid mass.  ?   Trachea: No tracheal deviation.  ?Cardiovascular:  ?   Rate and Rhythm: Normal rate and regular rhythm.  ?   Heart sounds: Normal heart sounds. No murmur heard. ?  No gallop.  ?Pulmonary:  ?   Effort: Pulmonary  effort is normal. No respiratory distress.  ?   Breath sounds: Normal breath sounds. No stridor. No decreased breath sounds, wheezing, rhonchi or rales.  ?Abdominal:  ?   General: There is no distension.  ?   Palpations: Abdomen is soft.  ?   Tenderness: There is no abdominal tenderness. There is no rebound.  ?Musculoskeletal:     ?   General: No tenderness. Normal range of motion.  ?     Arms: ? ?   Cervical back: Normal range of motion and neck supple.  ?   Comments: Small skin tear noted at right elbow  ?Skin: ?   General: Skin is warm and dry.  ?   Findings: No abrasion or rash.  ?Neurological:  ?   Mental Status: She is alert and oriented to person, place, and time. Mental status is at baseline.  ?   GCS: GCS eye subscore is 4. GCS verbal subscore is 5. GCS motor subscore is 6.  ?   Cranial Nerves: No cranial nerve deficit.  ?   Sensory: No sensory deficit.  ?   Motor: Motor function is intact.  ?Psychiatric:     ?   Attention and Perception: Attention normal.     ?   Speech: Speech normal.     ?   Behavior: Behavior normal.  ? ? ?ED Results / Procedures / Treatments   ?Labs ?(all labs ordered are listed, but only abnormal results are displayed) ?Labs Reviewed - No data to display ? ?EKG ?EKG Interpretation ? ?Date/Time:  Friday Jan 23 2022 14:33:58 EDT ?Ventricular Rate:  68 ?PR Interval:  177 ?QRS Duration: 84 ?QT Interval:  427 ?QTC Calculation: 455 ?R Axis:   82 ?Text Interpretation: Sinus rhythm Ventricular premature complex Borderline right axis deviation No significant change since last tracing Confirmed by Lacretia Leigh ([id number]) on 01/23/2022 2:45:45 PM ? ?Radiology ?DG Wrist Complete Right ? ?Result Date: 01/23/2022 ?CLINICAL DATA:  Right wrist pain from fall. Large hematoma and distal posterior forearm. EXAM: RIGHT WRIST - COMPLETE 3+ VIEW COMPARISON:  Right forearm radiographs 08/26/2020 FINDINGS: Mildly decreased bone mineralization. Neutral ulnar variance. Moderate triscaphe and mild thumb  carpometacarpal joint space narrowing. Mild distal radioulnar joint degenerative osteophytosis. No acute fracture is seen. No dislocation. Large distal dorsal forearm hematoma with dorsal bulging measuring up to approximately 1.7 cm in AP dimension at the level of the distal radial metadiaphysis. Moderate associated hematoma/swelling of the lateral aspect of the distal forearm. IMPRESSION:: IMPRESSION: 1. Large distal dorsal forearm hematoma. 2. No acute fracture is seen. 3. 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 Electronically Signed   By: Viann Fish.D.  On: 01/23/2022 13:57   ? ?Procedures ?Procedures  ? ? ?Medications Ordered in ED ?Medications - No data to display ? ?ED Course/ Medical Decision Making/ A&P ?  ?                        ?Medical Decision Making ?Amount and/or Complexity of Data Reviewed ?Radiology: ordered. ?ECG/medicine tests: ordered. ? ? ?X-ray of patient's right wrist without evidence of fracture.  She did have a small skin tear that was repaired with Steri-Strips per nursing.  Her tetanus status is current at this time.  EKG per my interpretation shows normal sinus rhythm.  No evidence of QT prolongation.  Do not think that she had a syncopal event.  Suspect that her symptoms are from her new medication.  Will discharge home ? ? ? ? ? ? ? ?Final Clinical Impression(s) / ED Diagnoses ?Final diagnoses:  ?None  ? ? ?Rx / DC Orders ?ED Discharge Orders   ? ? None  ? ?  ? ? ?  ?Lacretia Leigh, MD ?01/23/22 1447 ? ?

## 2022-04-23 NOTE — Patient Instructions (Signed)
Below is our plan:  We will continue donepezil '10mg'$  daily. We can consider adding memantine (Namenda) in the future. I would recommend a slow titration of '5mg'$  daily for two weeks then '5mg'$  twice daily for 2-3 months. We can increase the dose to the maintenance dose of '10mg'$  twice daily if well tolerated.   Please make sure you are staying well hydrated. I recommend 50-60 ounces daily. Well balanced diet and regular exercise encouraged. Consistent sleep schedule with 6-8 hours recommended.   Please continue follow up with care team as directed.   Follow up with me in 1 year   You may receive a survey regarding today's visit. I encourage you to leave honest feed back as I do use this information to improve patient care. Thank you for seeing me today!   Management of Memory Problems   There are some general things you can do to help manage your memory problems.  Your memory may not in fact recover, but by using techniques and strategies you will be able to manage your memory difficulties better.   1)  Establish a routine. Try to establish and then stick to a regular routine.  By doing this, you will get used to what to expect and you will reduce the need to rely on your memory.  Also, try to do things at the same time of day, such as taking your medication or checking your calendar first thing in the morning. Think about think that you can do as a part of a regular routine and make a list.  Then enter them into a daily planner to remind you.  This will help you establish a routine.   2)  Organize your environment. Organize your environment so that it is uncluttered.  Decrease visual stimulation.  Place everyday items such as keys or cell phone in the same place every day (ie.  Basket next to front door) Use post it notes with a brief message to yourself (ie. Turn off light, lock the door) Use labels to indicate where things go (ie. Which cupboards are for food, dishes, etc.) Keep a notepad and pen  by the telephone to take messages   3)  Memory Aids A diary or journal/notebook/daily planner Making a list (shopping list, chore list, to do list that needs to be done) Using an alarm as a reminder (kitchen timer or cell phone alarm) Using cell phone to store information (Notes, Calendar, Reminders) Calendar/White board placed in a prominent position Post-it notes   In order for memory aids to be useful, you need to have good habits.  It's no good remembering to make a note in your journal if you don't remember to look in it.  Try setting aside a certain time of day to look in journal.   4)  Improving mood and managing fatigue. There may be other factors that contribute to memory difficulties.  Factors, such as anxiety, depression and tiredness can affect memory. Regular gentle exercise can help improve your mood and give you more energy. Simple relaxation techniques may help relieve symptoms of anxiety Try to get back to completing activities or hobbies you enjoyed doing in the past. Learn to pace yourself through activities to decrease fatigue. Find out about some local support groups where you can share experiences with others. Try and achieve 7-8 hours of sleep at night.

## 2022-04-23 NOTE — Progress Notes (Signed)
Chief Complaint  Patient presents with   Follow-up    Rm 12. Accompanied by daughter. Doing well on donepezil 10 mg daily. Denies any memory changes.    HISTORY OF PRESENT ILLNESS:  04/27/22 ALL:  [name] is a 88 y.o. female here today for follow up for memory loss. She was last seen by Dr [name] 10/2021. She continues donepezil 36m daily.   She reports doing well. Memory seems stable. She is tolerating donepezil 167mdaily. She is able to perform most ADLs independently. Her daughter helps wash her hair. She does not cook but can make a sandwich or bowl of cereal. Karina Jarviselps with medications. She usually sleeps from 12a-7a. She may wake 2-3 times to urinate. She was started on Gemtesa and considering Botox. She does not drive.   She did have a fall 01/23/2022. She reported dizziness after standing up from the toilet. She had started oxybutynin a few days prior to event. Other than a hematoma to the right arm, no other significant injuries. No falls since.   HISTORY (copied from Dr AtGuadelupe Sabinrevious note)  Ms. [name] an [age]ear old left-handed woman with an underlying complex medical history of diabetes, coronary artery disease with status post stent placements, history of MI, hypertension, hyperlipidemia, hypothyroidism, depression, osteoarthritis, carotid artery disease with status post carotid endarterectomy, history of pleural effusions, followed by pulmonology, who presents for follow-up consultation of her memory loss.  The patient is accompanied by her daughter today and presents after a longer gap of over 15 months.  She canceled an interim appointment with the nurse practitioner for January 2022.  I first met her at the request of her primary care physician on 06/24/2020, at which time she reported an approximately 1 year history of forgetfulness.  Her daughter had her move in with her in February 2021.  Daughter had noticed more confusion and forgetfulness in the  month or 2 prior to the visit.  Her MMSE was 15 out of 30 at the time.  We did show her additional blood work, she had previously had blood work with her primary care.  Her A1c was elevated mildly at 7.4.  She was advised to start a trial of Aricept low-dose, at 5 mg daily.  We increase this in the interim in November 2021 to 10 mg once daily.   Today, 10/30/2021: She reports doing fairly well.  History is primarily provided by her daughter who reports that patient has been able to tolerate the donepezil at 10 mg once daily, appetite is good, weight fairly stable, they try to encourage water intake.  She uses a 2 wheeled walker but has fallen a few times, sustained bruising and skinned her ankle.  She has an ulcer in the right foot and also left heel, she recently had endovascular intervention on 10/15/2021 under [name] Her cardiologist is Dr. O'Davina Pitts She is on aspirin and Plavix.  She bruises easily.  Her daughter feels that her memory is stable and in fact, she has had less confusion.  She does have difficulty with short-term memory and forgetfulness, and in particular, retaining new information.   REVIEW OF SYSTEMS: Out of a complete 14 system review of symptoms, the patient complains only of the following symptoms, memory loss, and all other reviewed systems are negative.   ALLERGIES: Allergies  Allergen Reactions   Tape Other (See Comments)    SKIN IS VERY THIN AND TEARS EXTREMELY EASILY!!   Lisinopril Cough  Morphine And Related     Narcotic pain meds/Morphine exacerbate dementia symptoms (combative)   Nitrofuran Derivatives Nausea And Vomiting and Other (See Comments)    Dizziness, also   Other     Narcotic pain meds exacerbate dementia symptoms (combative)     HOME MEDICATIONS: Outpatient Medications Prior to Visit  Medication Sig Dispense Refill   acetaminophen (TYLENOL) 500 MG tablet Take 500 mg by mouth every 6 (six) hours as needed for moderate pain.     aspirin EC 81 MG  tablet Take 1 tablet (81 mg total) by mouth daily. Swallow whole. 90 tablet 3   Biotin 1000 MCG tablet Take 1,000 mcg by mouth daily.     Cholecalciferol (VITAMIN D3 SUPER STRENGTH) 50 MCG (2000 UT) TABS Take 2,000 Units by mouth daily.     clopidogrel (PLAVIX) 75 MG tablet Take 75 mg by mouth every Monday, Wednesday, and Friday.      donepezil (ARICEPT) 10 MG tablet Take 1 tablet (10 mg total) by mouth at bedtime. 90 tablet 2   furosemide (LASIX) 20 MG tablet Take 1 tablet (20 mg total) by mouth daily as needed (weight gain 2-3 lbs.). (Patient taking differently: Take 20 mg by mouth See admin instructions. Take 20 mg every other day as needed for weight gain 2-3 lbs.) 30 tablet 3   levothyroxine (SYNTHROID) 25 MCG tablet Take 25 mcg by mouth daily before breakfast.     sertraline (ZOLOFT) 100 MG tablet Take 100 mg by mouth at bedtime.     Vibegron (GEMTESA) 75 MG TABS Take 75 mg by mouth daily.     Calcium-Magnesium-Zinc (CAL-MAG-ZINC PO) Take 1 tablet by mouth daily.     doxycycline (VIBRAMYCIN) 100 MG capsule Take 1 capsule (100 mg total) by mouth 2 (two) times daily. (Patient not taking: Reported on 12/02/2021) 14 capsule 0   lidocaine-prilocaine (EMLA) cream Apply 1 application topically as needed. (Patient not taking: Reported on 12/02/2021) 30 g 2   metFORMIN (GLUCOPHAGE) 500 MG tablet Take 1 tablet (500 mg total) by mouth 2 (two) times daily with a meal. (Patient not taking: Reported on 12/02/2021) 180 tablet 1   rosuvastatin (CRESTOR) 40 MG tablet Take 40 mg by mouth every evening.     No facility-administered medications prior to visit.     PAST MEDICAL HISTORY: Past Medical History:  Diagnosis Date   Coronary artery disease    DM (diabetes mellitus) ([place])    HTN (hypertension)    Hyperlipidemia    Myocardial infarct ([place]) 2012   Renal disorder      PAST SURGICAL HISTORY: Past Surgical History:  Procedure Laterality Date   ABDOMINAL AORTOGRAM W/LOWER EXTREMITY N/A 10/15/2021    Procedure: ABDOMINAL AORTOGRAM W/LOWER EXTREMITY;  Surgeon: Wellington [facility], MD;  Location: [facility];  Service: Cardiovascular;  Laterality: N/A;   CARDIAC CATHETERIZATION     CAROTID ENDARTERECTOMY     CORONARY ANGIOPLASTY     CORONARY STENT PLACEMENT     IR THORACENTESIS ASP PLEURAL SPACE W/IMG GUIDE  08/13/2020   PERIPHERAL VASCULAR BALLOON ANGIOPLASTY  10/15/2021   Procedure: PERIPHERAL VASCULAR BALLOON ANGIOPLASTY;  Surgeon: Wellington Hampshire, MD;  Location: [facility];  Service: Cardiovascular;;     FAMILY HISTORY: Family History  Problem Relation Age of Onset   Alzheimer's disease Mother    Heart attack Father    Heart disease Father    Lymphoma Sister    Heart failure Brother      SOCIAL  HISTORY: Social History   Socioeconomic History   Marital status: Widowed    Spouse name: Not on file   Number of children: Not on file   Years of education: Not on file   Highest education level: Not on file  Occupational History   Not on file  Tobacco Use   Smoking status: Former    Packs/day: 1.00    Years: 2.00    Total pack years: 2.00    Types: Cigarettes    Quit date: 11/15/1969    Years since quitting: 52.4   Smokeless tobacco: Never  Vaping Use   Vaping Use: Never used  Substance and Sexual Activity   Alcohol use: Never   Drug use: Never   Sexual activity: Not on file  Other Topics Concern   Not on file  Social History Narrative   Not on file   Social Determinants of Health   Financial Resource Strain: Not on file  Food Insecurity: No Food Insecurity (08/14/2020)   Hunger Vital Sign    Worried About Running Out of Food in the Last Year: Never true    Ran Out of Food in the Last Year: Never true  Transportation Needs: No Transportation Needs (08/14/2020)   PRAPARE - Hydrologist (Medical): No    Lack of Transportation (Non-Medical): No  Physical Activity: Not on file  Stress: Not on file  Social  Connections: Not on file  Intimate Partner Violence: Not on file     PHYSICAL EXAM  Vitals:   04/27/22 1255  BP: (!) 154/53  Pulse: 63  Weight: 110 lb 8 oz (50.1 kg)  Height: 5' 7"  (1.702 m)   Body mass index is 17.31 kg/m.  Generalized: Well developed, in no acute distress  Cardiology: normal rate and rhythm, no murmur auscultated  Respiratory: clear to auscultation bilaterally    Neurological examination  Mentation: Alert oriented to time, place, history taking. Follows all commands speech and language fluent Cranial nerve II-XII: Pupils were equal round reactive to light. Extraocular movements were full, visual field were full on confrontational test. Facial sensation and strength were normal. Uvula tongue midline. Head turning and shoulder shrug  were normal and symmetric. Motor: The motor testing reveals 5 over 5 strength of all 4 extremities. Good symmetric motor tone is noted throughout.  Sensory: Sensory testing is intact to soft touch on all 4 extremities. No evidence of extinction is noted.  Coordination: Cerebellar testing reveals good finger-nose-finger and heel-to-shin bilaterally.  Gait and station: Gait is normal. Tandem gait is normal. Romberg is negative. No drift is seen.  Reflexes: Deep tendon reflexes are symmetric and normal bilaterally.    DIAGNOSTIC DATA (LABS, IMAGING, TESTING) - I reviewed patient records, labs, notes, testing and imaging myself where available.  Lab Results  Component Value Date   WBC 6.5 11/05/2021   HGB 10.5 (L) 11/05/2021   HCT 31.9 (L) 11/05/2021   MCV 97 11/05/2021   PLT 289 11/05/2021      Component Value Date/Time   NA 134 11/05/2021 1257   K 4.8 11/05/2021 1257   CL 97 11/05/2021 1257   CO2 31 (H) 11/05/2021 1257   GLUCOSE 346 (H) 11/05/2021 1257   GLUCOSE 143 (H) 08/14/2020 0245   BUN 21 11/05/2021 1257   CREATININE 1.56 (H) 11/05/2021 1257   CALCIUM 10.3 11/05/2021 1257   PROT 7.2 08/11/2020 1359   PROT 6.1  06/24/2020 1538   ALBUMIN 3.7 08/11/2020 1359  ALBUMIN 4.0 06/24/2020 1538   AST 22 08/11/2020 1359   ALT 29 08/11/2020 1359   ALKPHOS 70 08/11/2020 1359   BILITOT 0.8 08/11/2020 1359   BILITOT 0.3 06/24/2020 1538   GFRNONAA 29 (L) 08/14/2020 0245   GFRAA 31 (L) 06/24/2020 1538   No results found for: "CHOL", "HDL", "LDLCALC", "LDLDIRECT", "TRIG", "CHOLHDL" Lab Results  Component Value Date   HGBA1C 7.4 (H) 06/24/2020   No results found for: "VITAMINB12" Lab Results  Component Value Date   TSH 1.183 08/13/2020       04/27/2022   12:56 PM 10/30/2021    1:24 PM 06/24/2020    2:35 PM  MMSE - Mini Mental State Exam  Orientation to time 3 3 0  Orientation to Place 4 4 4   Registration 3 3 3   Attention/ Calculation 4 1 2   Recall 2 2 0  Language- name 2 objects 2 2 2   Language- repeat 1 1 1   Language- follow 3 step command 3 3 2   Language- read & follow direction 1 1 1   Write a sentence 1 1 0  Copy design 0 0 0  Total score 24 21 15          No data to display           ASSESSMENT AND PLAN  88 y.o. year old female  has a past medical history of Coronary artery disease, DM (diabetes mellitus) ([place]), HTN (hypertension), Hyperlipidemia, Myocardial infarct ([place]) (2012), and Renal disorder. here with    Dementia without behavioral disturbance ([place])  Hephzibah Strehle is doing well, today. Memory seems stable. MMSE 24/30, previously 31/30. She will continue donepezil 35m at bedtime. We have discussed adding memantine but she wishes to wait at this time. Memory compensation strategies reviewed. Healthy lifestyle habits encouraged. She will follow up with PCP as directed. She will return to see me in 1 year, sooner if needed. She verbalizes understanding and agreement with this plan.   No orders of the defined types were placed in this encounter.    No orders of the defined types were placed in this encounter.    I spent 30 minutes of face-to-face and non-face-to-face  time with patient.  This included previsit chart review, lab review, study review, order entry, electronic health record documentation, patient education.   ADebbora Presto MSN, FNP-C 04/27/2022, 1:18 PM  [facility] [address] [place] [phone]

## 2022-04-27 ENCOUNTER — Ambulatory Visit (INDEPENDENT_AMBULATORY_CARE_PROVIDER_SITE_OTHER): Payer: Medicare Other | Admitting: Family Medicine

## 2022-04-27 ENCOUNTER — Encounter: Payer: Self-pay | Admitting: Family Medicine

## 2022-04-27 VITALS — BP 154/53 | HR 63 | Ht 67.0 in | Wt 110.5 lb

## 2022-04-27 DIAGNOSIS — F0390 Unspecified dementia without behavioral disturbance: Secondary | ICD-10-CM

## 2022-04-27 MED ORDER — DONEPEZIL HCL 10 MG PO TABS: 10.0000 mg | ORAL_TABLET | Freq: Every day | ORAL | 3 refills | Status: DC

## 2022-05-14 ENCOUNTER — Ambulatory Visit: Payer: Medicare Other | Admitting: Cardiovascular Disease

## 2022-05-22 ENCOUNTER — Other Ambulatory Visit: Payer: Self-pay | Admitting: Urology

## 2022-05-26 ENCOUNTER — Ambulatory Visit (INDEPENDENT_AMBULATORY_CARE_PROVIDER_SITE_OTHER): Payer: Medicare Other | Admitting: Cardiovascular Disease

## 2022-05-26 ENCOUNTER — Ambulatory Visit (HOSPITAL_COMMUNITY)
Admission: RE | Admit: 2022-05-26 | Discharge: 2022-05-26 | Disposition: A | Discharge status: Home / Self Care | Payer: Medicare Other | Source: Ambulatory Visit | Attending: Cardiology | Admitting: Cardiology

## 2022-05-26 ENCOUNTER — Encounter (HOSPITAL_COMMUNITY)
Admission: RE | Admit: 2022-05-26 | Discharge: 2022-05-26 | Disposition: A | Discharge status: Home / Self Care | Payer: Medicare Other | Source: Ambulatory Visit | Attending: Cardiovascular Disease | Admitting: Cardiovascular Disease

## 2022-05-26 ENCOUNTER — Encounter: Payer: Self-pay | Admitting: Cardiovascular Disease

## 2022-05-26 ENCOUNTER — Other Ambulatory Visit: Payer: Self-pay

## 2022-05-26 ENCOUNTER — Encounter (HOSPITAL_COMMUNITY): Payer: Self-pay | Admitting: Urology

## 2022-05-26 VITALS — BP 144/72 | HR 63 | Ht 67.0 in | Wt 111.0 lb

## 2022-05-26 VITALS — BP 132/54 | HR 62 | Temp 97.8°F | Resp 16 | Ht 67.0 in | Wt 111.0 lb

## 2022-05-26 DIAGNOSIS — E785 Hyperlipidemia, unspecified: Secondary | ICD-10-CM | POA: Insufficient documentation

## 2022-05-26 DIAGNOSIS — Z01818 Encounter for other preprocedural examination: Secondary | ICD-10-CM | POA: Diagnosis present

## 2022-05-26 DIAGNOSIS — I6523 Occlusion and stenosis of bilateral carotid arteries: Secondary | ICD-10-CM

## 2022-05-26 DIAGNOSIS — Z9862 Peripheral vascular angioplasty status: Secondary | ICD-10-CM | POA: Diagnosis not present

## 2022-05-26 DIAGNOSIS — I5032 Chronic diastolic (congestive) heart failure: Secondary | ICD-10-CM | POA: Insufficient documentation

## 2022-05-26 DIAGNOSIS — I739 Peripheral vascular disease, unspecified: Secondary | ICD-10-CM

## 2022-05-26 DIAGNOSIS — E139 Other specified diabetes mellitus without complications: Secondary | ICD-10-CM | POA: Insufficient documentation

## 2022-05-26 DIAGNOSIS — I2510 Atherosclerotic heart disease of native coronary artery without angina pectoris: Secondary | ICD-10-CM | POA: Insufficient documentation

## 2022-05-26 LAB — GLUCOSE, CAPILLARY
Glucose-Capillary: 227 mg/dL — ABNORMAL HIGH (ref 70–99)
Glucose-Capillary: 234 mg/dL — ABNORMAL HIGH (ref 70–99)

## 2022-05-26 LAB — BASIC METABOLIC PANEL
Anion gap: 4 — ABNORMAL LOW (ref 5–15)
BUN: 26 mg/dL — ABNORMAL HIGH (ref 8–23)
CO2: 28 mmol/L (ref 22–32)
Calcium: 9.5 mg/dL (ref 8.9–10.3)
Chloride: 107 mmol/L (ref 98–111)
Creatinine, Ser: 1.16 mg/dL — ABNORMAL HIGH (ref 0.44–1.00)
GFR, Estimated: 45 mL/min — ABNORMAL LOW (ref >60)
Glucose, Bld: 239 mg/dL — ABNORMAL HIGH (ref 70–99)
Potassium: 4.7 mmol/L (ref 3.5–5.1)
Sodium: 139 mmol/L (ref 135–145)

## 2022-05-26 LAB — CBC
HCT: 34.6 % — ABNORMAL LOW (ref 36.0–46.0)
Hemoglobin: 11.2 g/dL — ABNORMAL LOW (ref 12.0–15.0)
MCH: 32.6 pg (ref 26.0–34.0)
MCHC: 32.4 g/dL (ref 30.0–36.0)
MCV: 100.6 fL — ABNORMAL HIGH (ref 80.0–100.0)
Platelets: 204 10*3/uL (ref 150–400)
RBC: 3.44 MIL/uL — ABNORMAL LOW (ref 3.87–5.11)
RDW: 12.8 % (ref 11.5–15.5)
WBC: 6.5 10*3/uL (ref 4.0–10.5)
nRBC: 0 % (ref 0.0–0.2)

## 2022-05-26 LAB — HEMOGLOBIN A1C
Hgb A1c MFr Bld: 8.8 % — ABNORMAL HIGH (ref 4.8–5.6)
Mean Plasma Glucose: 205.86 mg/dL

## 2022-05-26 NOTE — Progress Notes (Addendum)
Anesthesia Review:  PCP:  Cristie Hem  Cardiologist : Eleonore Chiquito  LOV DR Fletcher Anon on 12/02/21  Dopplers on 05/26/22  Neurology- LOV Debbora Presto, NP on 04/27/22  Chest x-ray : EKG : 01/23/22  Echo : Stress test: Cardiac Cath :  Activity level: cannot do a flight of stairs  Sleep Study/ CPAP : none  Fasting Blood Sugar :      / Checks Blood Sugar -- times a day:   Blood Thinner/ Instructions /Last Dose: ASA / Instructions/ Last Dose :   DM- type  2 Hgba1c-  05/26/22-  81 mg Aspirin  Plavix- M-w-f - Last dose on 05/25/22 per daughter  PT has memory issues. Daughter- Sherrie Mustache is POA.  Daughter to be with pt at surgery.  Daughter to bring in [place] papers on DOS.  Daughter to sign consent form.  PT uses walker mostly for ambulation.  Lives with daughter.   OF  hypertensive meds x 1 year per daughter at time of preop appt on 05/26/22.  Comp[eted med hx and instrucitons with daughter via phone.  Lab appt on 05/26/22 at 100pm..  Copy of instructions to be given to pt and daughter. Along with hibiclens.

## 2022-05-26 NOTE — Patient Instructions (Signed)
Medication Instructions:  No changes *If you need a refill on your cardiac medications before your next appointment, please call your pharmacy*   Lab Work: None ordered If you have labs (blood work) drawn today and your tests are completely normal, you will receive your results only by: MyChart Message (if you have MyChart) OR A paper copy in the mail If you have any lab test that is abnormal or we need to change your treatment, we will call you to review the results.   Testing/Procedures: None ordered   Follow-Up: At [facility], you and your health needs are our priority.  As part of our continuing mission to provide you with exceptional heart care, we have created designated Provider Care Teams.  These Care Teams include your primary Cardiologist (physician) and Advanced Practice Providers (APPs -  Physician Assistants and Nurse Practitioners) who all work together to provide you with the care you need, when you need it.  We recommend signing up for the patient portal called "MyChart".  Sign up information is provided on this After Visit Summary.  MyChart is used to connect with patients for Virtual Visits (Telemedicine).  Patients are able to view lab/test results, encounter notes, upcoming appointments, etc.  Non-urgent messages can be sent to your provider as well.   To learn more about what you can do with MyChart, go to [email or web address].    Your next appointment:   6 month(s)  The format for your next appointment:   In Person  Provider:   Dr. Arida  Important Information About Sugar       

## 2022-05-26 NOTE — Patient Instructions (Addendum)
SURGICAL WAITING ROOM VISITATION Patients having surgery or a procedure may have no more than 2 support people in the waiting area - these visitors may rotate.   Children under the age of 33 must have an adult with them who is not the patient. If the patient needs to stay at the hospital during part of their recovery, the visitor guidelines for inpatient rooms apply. Pre-op nurse will coordinate an appropriate time for 1 support person to accompany patient in pre-op.  This support person may not rotate.    Please refer to the [facility] website for the visitor guidelines for Inpatients (after your surgery is over and you are in a regular room).       Your procedure is scheduled on:  06/02/2022    Report to [facility] Main Entrance    Report to admitting at   1045AM   Call this number if you have problems the morning of surgery [phone]   Do not eat food :After Midnight.   After Midnight you may have the following liquids until ___0945___ AM  DAY OF SURGERY  Water Non-Citrus Juices (without pulp, NO RED) Carbonated Beverages Black Coffee (NO MILK/CREAM OR CREAMERS, sugar ok)  Clear Tea (NO MILK/CREAM OR CREAMERS, sugar ok) regular and decaf                             Plain Jell-O (NO RED)                                           Fruit ices (not with fruit pulp, NO RED)                                     Popsicles (NO RED)                                                               Sports drinks like Gatorade (NO RED)                          Oral Hygiene is also important to reduce your risk of infection.                                    Remember - BRUSH YOUR TEETH THE MORNING OF SURGERY WITH YOUR REGULAR TOOTHPASTE   Do NOT smoke after Midnight   Take these medicines the morning of surgery with A SIP OF WATER:  synthroid,  Take 1/2 dose of Tresiba nite before surgery  Lyumjev- Do usual dose at 6pm day before surgery.    DO NOT TAKE ANY ORAL  DIABETIC MEDICATIONS DAY OF YOUR SURGERY  Bring CPAP mask and tubing day of surgery.                              You may not have any metal on your body including hair pins, jewelry,  and body piercing             Do not wear make-up, lotions, powders, perfumes/cologne, or deodorant  Do not wear nail polish including gel and S&S, artificial/acrylic nails, or any other type of covering on natural nails including finger and toenails. If you have artificial nails, gel coating, etc. that needs to be removed by a nail salon please have this removed prior to surgery or surgery may need to be canceled/ delayed if the surgeon/ anesthesia feels like they are unable to be safely monitored.   Do not shave  48 hours prior to surgery.               Men may shave face and neck.   Do not bring valuables to the hospital. [place].   Contacts, dentures or bridgework may not be worn into surgery.   Bring small overnight bag day of surgery.   DO NOT [place]. PHARMACY WILL DISPENSE MEDICATIONS LISTED ON YOUR MEDICATION LIST TO YOU DURING YOUR ADMISSION [place]!    Patients discharged on the day of surgery will not be allowed to drive home.  Someone NEEDS to stay with you for the first 24 hours after anesthesia.   Special Instructions: Bring a copy of your healthcare power of attorney and living will documents         the day of surgery if you haven't scanned them before.              Please read over the following fact sheets you were given: IF YOU HAVE QUESTIONS ABOUT YOUR PRE-OP INSTRUCTIONS PLEASE CALL [phone]     [facility] - Preparing for Surgery Before surgery, you can play an important role.  Because skin is not sterile, your skin needs to be as free of germs as possible.  You can reduce the number of germs on your skin by washing with CHG (chlorahexidine gluconate) soap before surgery.  CHG is an  antiseptic cleaner which kills germs and bonds with the skin to continue killing germs even after washing. Please DO NOT use if you have an allergy to CHG or antibacterial soaps.  If your skin becomes reddened/irritated stop using the CHG and inform your nurse when you arrive at Short Stay. Do not shave (including legs and underarms) for at least 48 hours prior to the first CHG shower.  You may shave your face/neck. Please follow these instructions carefully:  1.  Shower with CHG Soap the night before surgery and the  morning of Surgery.  2.  If you choose to wash your hair, wash your hair first as usual with your  normal  shampoo.  3.  After you shampoo, rinse your hair and body thoroughly to remove the  shampoo.                           4.  Use CHG as you would any other liquid soap.  You can apply chg directly  to the skin and wash                       Gently with a scrungie or clean washcloth.  5.  Apply the CHG Soap to your body ONLY FROM THE NECK DOWN.   Do not use on face/  open                           Wound or open sores. Avoid contact with eyes, ears mouth and genitals (private parts).                       Wash face,  Genitals (private parts) with your normal soap.             6.  Wash thoroughly, paying special attention to the area where your surgery  will be performed.  7.  Thoroughly rinse your body with warm water from the neck down.  8.  DO NOT shower/wash with your normal soap after using and rinsing off  the CHG Soap.                9.  Pat yourself dry with a clean towel.            10.  Wear clean pajamas.            11.  Place clean sheets on your bed the night of your first shower and do not  sleep with pets. Day of Surgery : Do not apply any lotions/deodorants the morning of surgery.  Please wear clean clothes to the hospital/surgery center.  FAILURE TO FOLLOW THESE INSTRUCTIONS MAY RESULT IN THE CANCELLATION OF YOUR SURGERY PATIENT  SIGNATURE_________________________________  NURSE SIGNATURE__________________________________  ________________________________________________________________________

## 2022-05-26 NOTE — Progress Notes (Signed)
Cardiology Office Note   Date:  05/28/2022   ID:  Jakelin Taussig, DOB 11-01-1931, MRN [id number]  PCP:  Michael Boston, MD  Cardiologist: Dr. Audie Box  No chief complaint on file.     History of Present Illness: [name] is a 88 y.o. female who is here today for follow-up visit regarding peripheral arterial disease. She has known history of chronic diastolic heart failure, coronary artery disease, carotid artery disease, chronic kidney disease, mild dementia, essential hypertension and hyperlipidemia.  She is status post right CEA.  She was seen recently for a pressure ulcer on the right lateral ankle. She underwent recent noninvasive vascular studies which showed an ABI of 0.93 on the right and 1.26 on the left.  Duplex showed severe stenosis in the right TP trunk with two-vessel runoff below the knee with an occluded mid posterior tibial artery.  I proceeded with angiography in January which showed no significant aortoiliac disease.  On the right side, there was mild SFA stenosis, severe stenosis in the distal popliteal artery just before the origin of the anterior tibial artery and the peroneal artery.  The posterior tibial artery was occluded.  I performed successful drug-coated balloon angioplasty to the distal right popliteal artery into the proximal portion of the peroneal artery.  She returned with left leg pain.  Lower extremity venous Doppler was negative for DVT.  Postprocedure ABI improved to normal.  Her mobility is very limited and denies claudication.  She underwent Doppler studies today which showed normal ABI bilaterally.  Duplex showed patent right popliteal artery.   Past Medical History:  Diagnosis Date   Arthritis    Cancer ([place])    hx of skin cancer   CHF (congestive heart failure) ([place])    Complication of anesthesia    no narcotics per daughter   Coronary artery disease    Depression    DM (diabetes mellitus) ([place])    GERD (gastroesophageal reflux  disease)    Hyperlipidemia    Hypothyroidism    Myocardial infarct [facility]) 2012   Renal disorder     Past Surgical History:  Procedure Laterality Date   ABDOMINAL AORTOGRAM W/LOWER EXTREMITY N/A 10/15/2021   Procedure: ABDOMINAL AORTOGRAM W/LOWER EXTREMITY;  Surgeon: Wellington Hampshire, MD;  Location: [place] CV LAB;  Service: Cardiovascular;  Laterality: N/A;   CARDIAC CATHETERIZATION     CAROTID ENDARTERECTOMY     CORONARY ANGIOPLASTY     CORONARY STENT PLACEMENT     IR THORACENTESIS ASP PLEURAL SPACE W/IMG GUIDE  08/13/2020   PERIPHERAL VASCULAR BALLOON ANGIOPLASTY  10/15/2021   Procedure: PERIPHERAL VASCULAR BALLOON ANGIOPLASTY;  Surgeon: Wellington Hampshire, MD;  Location: [place] CV LAB;  Service: Cardiovascular;;   TOTAL VAGINAL HYSTERECTOMY       Current Outpatient Medications  Medication Sig Dispense Refill   acetaminophen (TYLENOL) 500 MG tablet Take 500 mg by mouth every 6 (six) hours as needed for moderate pain.     Biotin 1000 MCG tablet Take 1,000 mcg by mouth daily.     Cholecalciferol (VITAMIN D3 SUPER STRENGTH) 50 MCG (2000 UT) TABS Take 2,000 Units by mouth daily.     clopidogrel (PLAVIX) 75 MG tablet Take 75 mg by mouth every Monday, Wednesday, and Friday.      donepezil (ARICEPT) 10 MG tablet Take 1 tablet (10 mg total) by mouth at bedtime. 90 tablet 3   furosemide (LASIX) 20 MG tablet Take 1 tablet (20 mg total) by mouth daily as  needed (weight gain 2-3 lbs.). (Patient taking differently: Take 20 mg by mouth See admin instructions. Take 20 mg every other day as needed for weight gain 2-3 lbs.) 30 tablet 3   GVOKE HYPOPEN 2-PACK 1 MG/0.2ML SOAJ Inject 1 mg into the skin as needed. Give in case of hypoglycemia. May repeat dose if there has been no response after 15 minutes.     levothyroxine (SYNTHROID) 25 MCG tablet Take 25 mcg by mouth daily before breakfast.     LYUMJEV KWIKPEN 100 UNIT/ML KwikPen Inject 2 Units into the skin daily.     sertraline (ZOLOFT)  100 MG tablet Take 100 mg by mouth at bedtime.     TRESIBA FLEXTOUCH 100 UNIT/ML FlexTouch Pen Inject 6 Units into the skin at bedtime.     Vibegron (GEMTESA) 75 MG TABS Take 75 mg by mouth daily.     aspirin EC 81 MG tablet Take 1 tablet (81 mg total) by mouth daily. Swallow whole. (Patient not taking: Reported on 05/26/2022) 90 tablet 3   No current facility-administered medications for this visit.    Allergies:   Tape, Lisinopril, Morphine and related, Nitrofuran derivatives, and Other    Social History:  The patient  reports that she quit smoking about 52 years ago. Her smoking use included cigarettes. She has a 2.00 pack-year smoking history. She has never used smokeless tobacco. She reports that she does not drink alcohol and does not use drugs.   Family History:  The patient's family history includes Alzheimer's disease in her mother; Heart attack in her father; Heart disease in her father; Heart failure in her brother; Lymphoma in her sister.    ROS:  Please see the history of present illness.   Otherwise, review of systems are positive for none.   All other systems are reviewed and negative.    PHYSICAL EXAM: VS:  BP (!) 144/72   Pulse 63   Ht '5\' 7"'$  (1.702 m)   Wt 111 lb (50.3 kg)   SpO2 94%   BMI 17.39 kg/m  , BMI Body mass index is 17.39 kg/m. GEN: Well nourished, well developed, in no acute distress  HEENT: normal  Neck: no JVD, carotid bruits, or masses Cardiac: RRR; no murmurs, rubs, or gallops,no edema  Respiratory:  clear to auscultation bilaterally, normal work of breathing GI: soft, nontender, nondistended, + BS MS: no deformity or atrophy  Skin: warm and dry, no rash Neuro:  Strength and sensation are intact Psych: euthymic mood, full affect Vascular: Dorsalis pedis is nonpalpable on the right side.  The ulceration on the right lateral ankle is very superficial with granulation tissue    EKG:  EKG is not ordered today.      Recent Labs: 05/26/2022: BUN 26;  Creatinine, Ser 1.16; Hemoglobin 11.2; Platelets 204; Potassium 4.7; Sodium 139    Lipid Panel No results found for: "CHOL", "TRIG", "HDL", "CHOLHDL", "VLDL", "LDLCALC", "LDLDIRECT"    Wt Readings from Last 3 Encounters:  05/26/22 111 lb (50.3 kg)  05/26/22 111 lb (50.3 kg)  04/27/22 110 lb 8 oz (50.1 kg)           No data to display            ASSESSMENT AND PLAN:  1.  Peripheral arterial disease:  Status post successful endovascular intervention and revascularization of the distal popliteal artery into the peroneal artery.  I reviewed Doppler studies with her today.  She has normal ABI and no significant obstructive disease by  duplex.  She is on long-term dual antiplatelet therapy with aspirin and daily and Plavix 3 times a week.  2.  Chronic diastolic heart failure: She appears to be euvolemic.  3.  Coronary artery disease involving native coronary arteries without angina: Currently with no anginal symptoms.  4.  Carotid artery disease: Stable overall.  5.  Hyperlipidemia: Continue rosuvastatin with a target LDL of less than 70.  Continue rosuvastatin 40 mg daily.  6.  Chronic kidney disease: I reviewed her labs done recently which showed stable renal function with creatinine of 1.16 which is actually improved compared to before.    Disposition:   Follow-up in 6 months.  Signed,  [name], MD  05/28/2022 10:42 AM    [place]

## 2022-05-26 NOTE — Progress Notes (Signed)
Anesthesia Review:  PCP: DR Cristie Hem  Cardiologist : DR [facility]- followed by DR Candiss Norse.  Arterial Dopplers- 05/26/22  Chest x-ray : EKG : 01/23/22  Echo : 2021  Stress test: Cardiac Cath :  Activity level:  Sleep Study/ CPAP : Fasting Blood Sugar :      / Checks Blood Sugar -- times a day:   Blood Thinner/ Instructions /Last Dose: ASA / Instructions/ Last Dose :   DM- type 2 - checks glucose 2x daily at home LIves with Daughter. Pt has memory issues .  Daughter has POA.  To bring papers DOS.   Daughter is [name]  81 mg aspirin Plavix- M-W-f - LASt dose on 05/25/22 per daughter  OFF of htn meds x year per daughter.   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

## 2022-05-27 NOTE — Progress Notes (Signed)
Anesthesia Chart Review   Case: [id number] Date/Time: 06/02/22 1230   Procedures:      CYSTOSCOPY AND BULKAMID - 45 MINS     BOTOX INJECTION 75 UNITS   Anesthesia type: Monitor Anesthesia Care   Pre-op diagnosis: OVERACTIVE BLADDER, STRESS URINARY INCONTINENCE   Location: [facility] PROCEDURE [facility] / [facility]   Surgeons: Robley Fries, MD       DISCUSSION:88 y.o.o former smoker with h/o DM II (A1C 8.8), CAD, CHF, PAD, s/p right CEA, CKD, overactive bladder, stress urinary incontinence scheduled for above procedure 06/02/2022 with Dr. Jacalyn Lefevre.   Pt last seen by cardiology 05/26/2022.  Stable at this visit with 6 month follow up recommended.  VS: There were no vitals taken for this visit.  PROVIDERS: Michael Boston, MD is PCP   [name], MD is Cardiologist  LABS: Labs reviewed: Acceptable for surgery. (all labs ordered are listed, but only abnormal results are displayed)  Labs Reviewed - No data to display   IMAGES:   EKG:   CV: Echo 08/12/20 1. Left ventricular ejection fraction, by estimation, is 55 to 60%. The  left ventricle has normal function. The left ventricle has no regional  wall motion abnormalities. There is mild left ventricular hypertrophy.  Left ventricular diastolic parameters  are indeterminate.   2. Right ventricular systolic function is normal. The right ventricular  size is normal. There is moderately elevated pulmonary artery systolic  pressure. The estimated right ventricular systolic pressure is 66.0 mmHg.   3. Trivial to small pericardial effusion. The pericardial effusion is  circumferential. There is no evidence of cardiac tamponade.   4. The mitral valve is abnormal. Mild mitral valve regurgitation.   5. The aortic valve is tricuspid. Aortic valve regurgitation is not  visualized. Mild aortic valve sclerosis is present, with no evidence of  aortic valve stenosis.   6. The inferior vena cava is normal in size with greater than 50%   respiratory variability, suggesting right atrial pressure of 3 mmHg.  Past Medical History:  Diagnosis Date   Arthritis    Cancer ([place])    hx of skin cancer   CHF (congestive heart failure) ([place])    Complication of anesthesia    no narcotics per daughter   Coronary artery disease    Depression    DM (diabetes mellitus) ([place])    GERD (gastroesophageal reflux disease)    Hyperlipidemia    Hypothyroidism    Myocardial infarct [facility]) 2012   Renal disorder     Past Surgical History:  Procedure Laterality Date   ABDOMINAL AORTOGRAM W/LOWER EXTREMITY N/A 10/15/2021   Procedure: ABDOMINAL AORTOGRAM W/LOWER EXTREMITY;  Surgeon: Wellington Hampshire, MD;  Location: [place] [facility];  Service: Cardiovascular;  Laterality: N/A;   CARDIAC CATHETERIZATION     CAROTID ENDARTERECTOMY     CORONARY ANGIOPLASTY     CORONARY STENT PLACEMENT     IR THORACENTESIS ASP PLEURAL SPACE W/IMG GUIDE  08/13/2020   PERIPHERAL VASCULAR BALLOON ANGIOPLASTY  10/15/2021   Procedure: PERIPHERAL VASCULAR BALLOON ANGIOPLASTY;  Surgeon: Wellington Hampshire, MD;  Location: [place] [facility];  Service: Cardiovascular;;   TOTAL VAGINAL HYSTERECTOMY      MEDICATIONS:  acetaminophen (TYLENOL) 500 MG tablet   aspirin EC 81 MG tablet   Biotin 1000 MCG tablet   Cholecalciferol (VITAMIN D3 SUPER STRENGTH) 50 MCG (2000 UT) TABS   clopidogrel (PLAVIX) 75 MG tablet   donepezil (ARICEPT) 10 MG tablet   furosemide (LASIX)  20 MG tablet   GVOKE HYPOPEN 2-PACK 1 MG/0.2ML SOAJ   levothyroxine (SYNTHROID) 25 MCG tablet   LYUMJEV KWIKPEN 100 UNIT/ML KwikPen   sertraline (ZOLOFT) 100 MG tablet   TRESIBA FLEXTOUCH 100 UNIT/ML FlexTouch Pen   Vibegron (GEMTESA) 75 MG TABS   No current facility-administered medications for this encounter.    Konrad Felix Ward, PA-C [facility] Pre-Surgical Testing [phone]

## 2022-06-02 ENCOUNTER — Encounter (HOSPITAL_COMMUNITY)
Admission: RE | Disposition: A | Discharge status: Home / Self Care | Payer: Self-pay | Source: Home / Self Care | Attending: Urology

## 2022-06-02 ENCOUNTER — Ambulatory Visit (HOSPITAL_COMMUNITY)
Admission: RE | Admit: 2022-06-02 | Discharge: 2022-06-02 | Disposition: A | Discharge status: Home / Self Care | Payer: Medicare Other | Attending: Urology | Admitting: Urology

## 2022-06-02 ENCOUNTER — Encounter (HOSPITAL_COMMUNITY): Payer: Self-pay | Admitting: Urology

## 2022-06-02 ENCOUNTER — Ambulatory Visit (HOSPITAL_COMMUNITY): Payer: Medicare Other | Admitting: Physician Assistant

## 2022-06-02 ENCOUNTER — Other Ambulatory Visit: Payer: Self-pay

## 2022-06-02 ENCOUNTER — Ambulatory Visit (HOSPITAL_BASED_OUTPATIENT_CLINIC_OR_DEPARTMENT_OTHER): Payer: Medicare Other | Admitting: Anesthesiology

## 2022-06-02 DIAGNOSIS — I509 Heart failure, unspecified: Secondary | ICD-10-CM

## 2022-06-02 DIAGNOSIS — Z87891 Personal history of nicotine dependence: Secondary | ICD-10-CM | POA: Insufficient documentation

## 2022-06-02 DIAGNOSIS — N393 Stress incontinence (female) (male): Secondary | ICD-10-CM | POA: Diagnosis not present

## 2022-06-02 DIAGNOSIS — R350 Frequency of micturition: Secondary | ICD-10-CM | POA: Insufficient documentation

## 2022-06-02 DIAGNOSIS — I2510 Atherosclerotic heart disease of native coronary artery without angina pectoris: Secondary | ICD-10-CM | POA: Insufficient documentation

## 2022-06-02 DIAGNOSIS — Z7984 Long term (current) use of oral hypoglycemic drugs: Secondary | ICD-10-CM | POA: Insufficient documentation

## 2022-06-02 DIAGNOSIS — Z7902 Long term (current) use of antithrombotics/antiplatelets: Secondary | ICD-10-CM | POA: Insufficient documentation

## 2022-06-02 DIAGNOSIS — Z955 Presence of coronary angioplasty implant and graft: Secondary | ICD-10-CM | POA: Diagnosis not present

## 2022-06-02 DIAGNOSIS — I252 Old myocardial infarction: Secondary | ICD-10-CM | POA: Insufficient documentation

## 2022-06-02 DIAGNOSIS — Z794 Long term (current) use of insulin: Secondary | ICD-10-CM | POA: Insufficient documentation

## 2022-06-02 DIAGNOSIS — E119 Type 2 diabetes mellitus without complications: Secondary | ICD-10-CM | POA: Diagnosis not present

## 2022-06-02 DIAGNOSIS — N3281 Overactive bladder: Secondary | ICD-10-CM | POA: Insufficient documentation

## 2022-06-02 DIAGNOSIS — Z01818 Encounter for other preprocedural examination: Secondary | ICD-10-CM

## 2022-06-02 DIAGNOSIS — N3946 Mixed incontinence: Secondary | ICD-10-CM | POA: Diagnosis not present

## 2022-06-02 DIAGNOSIS — E039 Hypothyroidism, unspecified: Secondary | ICD-10-CM | POA: Diagnosis not present

## 2022-06-02 HISTORY — PX: BOTOX INJECTION: SHX5754

## 2022-06-02 HISTORY — DX: Depression, unspecified: F32.A

## 2022-06-02 HISTORY — DX: Heart failure, unspecified: I50.9

## 2022-06-02 HISTORY — DX: Gastro-esophageal reflux disease without esophagitis: K21.9

## 2022-06-02 HISTORY — DX: Malignant (primary) neoplasm, unspecified: C80.1

## 2022-06-02 HISTORY — DX: Unspecified osteoarthritis, unspecified site: M19.90

## 2022-06-02 HISTORY — DX: Hypothyroidism, unspecified: E03.9

## 2022-06-02 HISTORY — DX: Other complications of anesthesia, initial encounter: T88.59XA

## 2022-06-02 HISTORY — PX: CYSTOSCOPY: SHX5120

## 2022-06-02 LAB — GLUCOSE, CAPILLARY
Glucose-Capillary: 174 mg/dL — ABNORMAL HIGH (ref 70–99)
Glucose-Capillary: 146 mg/dL — ABNORMAL HIGH (ref 70–99)

## 2022-06-02 SURGERY — CYSTOSCOPY
Anesthesia: Monitor Anesthesia Care

## 2022-06-02 MED ORDER — PROPOFOL 10 MG/ML IV BOLUS
INTRAVENOUS | Status: DC | PRN
  Administered 2022-06-02 (×3): 10 mg via INTRAVENOUS

## 2022-06-02 MED ORDER — ORAL CARE MOUTH RINSE: 15.0000 mL | Freq: Once | OROMUCOSAL | Status: AC

## 2022-06-02 MED ORDER — SODIUM CHLORIDE (PF) 0.9 % IJ SOLN
INTRAMUSCULAR | Status: AC
  Filled 2022-06-02: qty 10

## 2022-06-02 MED ORDER — ONABOTULINUMTOXINA 100 UNITS IJ SOLR
INTRAMUSCULAR | Status: DC | PRN
  Administered 2022-06-02: 75 [IU] via INTRAMUSCULAR

## 2022-06-02 MED ORDER — AMISULPRIDE (ANTIEMETIC) 5 MG/2ML IV SOLN: 10.0000 mg | Freq: Once | INTRAVENOUS | Status: DC | PRN

## 2022-06-02 MED ORDER — SODIUM CHLORIDE (PF) 0.9 % IJ SOLN
INTRAMUSCULAR | Status: DC | PRN
  Administered 2022-06-02: 10 mL

## 2022-06-02 MED ORDER — DEXMEDETOMIDINE HCL IN NACL 80 MCG/20ML IV SOLN
INTRAVENOUS | Status: AC
  Filled 2022-06-02: qty 20

## 2022-06-02 MED ORDER — ACETAMINOPHEN 10 MG/ML IV SOLN: 750.0000 mg | Freq: Once | INTRAVENOUS | Status: DC | PRN

## 2022-06-02 MED ORDER — CHLORHEXIDINE GLUCONATE 0.12 % MT SOLN
15.0000 mL | Freq: Once | OROMUCOSAL | Status: AC
  Administered 2022-06-02: 15 mL via OROMUCOSAL

## 2022-06-02 MED ORDER — LABETALOL HCL 5 MG/ML IV SOLN
10.0000 mg | Freq: Once | INTRAVENOUS | Status: AC
  Administered 2022-06-02: 10 mg via INTRAVENOUS

## 2022-06-02 MED ORDER — FENTANYL CITRATE (PF) 100 MCG/2ML IJ SOLN
INTRAMUSCULAR | Status: AC
  Filled 2022-06-02: qty 2

## 2022-06-02 MED ORDER — ONDANSETRON HCL 4 MG/2ML IJ SOLN
INTRAMUSCULAR | Status: DC | PRN
  Administered 2022-06-02: 4 mg via INTRAVENOUS

## 2022-06-02 MED ORDER — PROPOFOL 500 MG/50ML IV EMUL
INTRAVENOUS | Status: DC | PRN
  Administered 2022-06-02: 75 ug/kg/min via INTRAVENOUS

## 2022-06-02 MED ORDER — ONABOTULINUMTOXINA 100 UNITS IJ SOLR
INTRAMUSCULAR | Status: AC
  Filled 2022-06-02: qty 100

## 2022-06-02 MED ORDER — CEPHALEXIN 250 MG PO CAPS: 250.0000 mg | ORAL_CAPSULE | Freq: Every day | ORAL | 0 refills | Status: AC

## 2022-06-02 MED ORDER — CEFAZOLIN SODIUM-DEXTROSE 2-4 GM/100ML-% IV SOLN
2.0000 g | INTRAVENOUS | Status: AC
  Administered 2022-06-02: 2 g via INTRAVENOUS
  Filled 2022-06-02: qty 100

## 2022-06-02 MED ORDER — FENTANYL CITRATE (PF) 100 MCG/2ML IJ SOLN
INTRAMUSCULAR | Status: DC | PRN
  Administered 2022-06-02: 25 ug via INTRAVENOUS

## 2022-06-02 MED ORDER — ONDANSETRON HCL 4 MG/2ML IJ SOLN: 4.0000 mg | Freq: Once | INTRAMUSCULAR | Status: DC | PRN

## 2022-06-02 MED ORDER — LACTATED RINGERS IV SOLN: INTRAVENOUS | Status: DC

## 2022-06-02 MED ORDER — PROPOFOL 500 MG/50ML IV EMUL
INTRAVENOUS | Status: AC
  Filled 2022-06-02: qty 50

## 2022-06-02 MED ORDER — ONDANSETRON HCL 4 MG/2ML IJ SOLN
INTRAMUSCULAR | Status: AC
  Filled 2022-06-02: qty 2

## 2022-06-02 MED ORDER — FENTANYL CITRATE PF 50 MCG/ML IJ SOSY: 25.0000 ug | PREFILLED_SYRINGE | INTRAMUSCULAR | Status: DC | PRN

## 2022-06-02 MED ORDER — SODIUM CHLORIDE 0.9 % IR SOLN
Status: DC | PRN
  Administered 2022-06-02: 3000 mL via INTRAVESICAL

## 2022-06-02 MED ORDER — LABETALOL HCL 5 MG/ML IV SOLN
INTRAVENOUS | Status: AC
  Filled 2022-06-02: qty 4

## 2022-06-02 SURGICAL SUPPLY — 20 items
BAG URO CATCHER STRL LF (MISCELLANEOUS) ×1 IMPLANT
CATH URETL OPEN END 6FR 70 (CATHETERS) ×1 IMPLANT
CLOTH BEACON ORANGE TIMEOUT ST (SAFETY) ×1 IMPLANT
ELECT REM PT RETURN 15FT ADLT (MISCELLANEOUS) ×1 IMPLANT
GLOVE BIO SURGEON STRL SZ 6.5 (GLOVE) ×1 IMPLANT
GOWN STRL REUS W/ TWL LRG LVL3 (GOWN DISPOSABLE) ×1 IMPLANT
GOWN STRL REUS W/TWL LRG LVL3 (GOWN DISPOSABLE) ×1
GUIDEWIRE STR DUAL SENSOR (WIRE) ×1 IMPLANT
KIT TURNOVER KIT A (KITS) ×1 IMPLANT
MANIFOLD NEPTUNE II (INSTRUMENTS) ×1 IMPLANT
NDL ASPIRATION 22 (NEEDLE) ×1 IMPLANT
NDL SAFETY ECLIP 18X1.5 (MISCELLANEOUS) ×1 IMPLANT
NEEDLE ASPIRATION 22 (NEEDLE) ×2 IMPLANT
PACK CYSTO (CUSTOM PROCEDURE TRAY) ×1 IMPLANT
SYR 20ML LL LF (SYRINGE) ×1 IMPLANT
SYR CONTROL 10ML LL (SYRINGE) ×1 IMPLANT
SYSTEM URETHRAL BULK BULKAMID (Female Continence) IMPLANT
TUBING CONNECTING 10 (TUBING) ×1 IMPLANT
TUBING UROLOGY SET (TUBING) ×1 IMPLANT
WATER STERILE IRR 3000ML UROMA (IV SOLUTION) ×1 IMPLANT

## 2022-06-02 NOTE — Discharge Instructions (Signed)
Cystoscopy with Bulkamid and Botox patient instructions  Following a cystoscopy, a catheter (a flexible rubber tube) is sometimes left in place to empty the bladder. This may cause some discomfort or a feeling that you need to urinate. Your doctor determines the period of time that the catheter will be left in place. You may have bloody urine for two to three days (Call your doctor if the amount of bleeding increases or does not subside).  You may pass blood clots in your urine, especially if you had a biopsy. It is not unusual to pass small blood clots and have some bloody urine a couple of weeks after your cystoscopy. Again, call your doctor if the bleeding does not subside. You may have: Dysuria (painful urination) Frequency (urinating often) Urgency (strong desire to urinate)  These symptoms are common especially if medicine is instilled into the bladder or a ureteral stent is placed. Avoiding alcohol and caffeine, such as coffee, tea, and chocolate, may help relieve these symptoms. Drink plenty of water, unless otherwise instructed. Your doctor may also prescribe an antibiotic or other medicine to reduce these symptoms.  Cystoscopy results are available soon after the procedure; biopsy results usually take two to four days. Your doctor will discuss the results of your exam with you. Before you go home, you will be given specific instructions for follow-up care. Special Instructions:   If you are going home with a catheter in place do not take a tub bath until removed by your doctor.   You may resume your normal activities.   Do not drive or operate machinery if you are taking narcotic pain medicine.   Be sure to keep all follow-up appointments with your doctor.   Call Your Doctor If: The catheter is not draining You have severe pain You are unable to urinate You have a fever over 101 You have severe bleeding

## 2022-06-02 NOTE — Anesthesia Procedure Notes (Signed)
Procedure Name: MAC Date/Time: 06/02/2022 1:15 PM  Performed by: Maxwell Caul, CRNAPre-anesthesia Checklist: Patient identified, Emergency Drugs available, Suction available and Patient being monitored Oxygen Delivery Method: Simple face mask

## 2022-06-02 NOTE — Transfer of Care (Signed)
Immediate Anesthesia Transfer of Care Note  Patient: [name]  Procedure(s) Performed: CYSTOSCOPY AND BULKAMID BOTOX INJECTION 75 UNITS  Patient Location: PACU  Anesthesia Type:MAC  Level of Consciousness: awake, alert  and oriented  Airway & Oxygen Therapy: Patient Spontanous Breathing and Patient connected to face mask oxygen  Post-op Assessment: Report given to RN and Post -op Vital signs reviewed and stable  Post vital signs: Reviewed and stable  Last Vitals:  Vitals Value Taken Time  BP    Temp    Pulse    Resp    SpO2      Last Pain:  Vitals:   06/02/22 1124  TempSrc:   PainSc: 0-No pain         Complications: No notable events documented.

## 2022-06-02 NOTE — Op Note (Signed)
Operative Note  Preoperative diagnosis:  1.  OAB 2.  SUI  Postoperative diagnosis: 1.  OAB 2.  SUI  Procedure(s): 1.  Cystoscopy with bulkamid 2.  Cystoscopy with botox injections 75 units  Surgeon: Jacalyn Lefevre, MD  Assistants:  None  Anesthesia:  MAC  Complications:  None  EBL:  minimal  Specimens: 1. none  Drains/Catheters: 1.  none  Intraoperative findings:   Small caruncle noted at urethral meatus Bilateral orthotropic Uos No bladder masses or stones.  Mild trabeculation.  Indication:  [name] is a 88 y.o. female with mixed urinary incontinence who has failed medication. Urodynamics showed small capacity bladder with both SUI and bladder overactivity.   Description of procedure:  After risks and benefits of the procedure were discussed with the patient and her daughter, informed consent was obtained.  The patient was taken to the operating room placed in the supine position.  Anesthesia was induced and antibiotics were administered.  The patient was repositioned in the dorsolithotomy position.  She was prepped and draped in usual sterile fashion.  A timeout was performed.  Urethral sounds were used to dilate the urethral meatus from 16 [place] to 24 [place].  Next a 34 French rigid cystoscope was placed in the with meatus and advanced in the bladder and direct visualization.  Findings noted above.  75 units of Botox were then injected and 0.5 mL increments over the posterior wall the bladder taking care to avoid the ureteral orifices.  The cystoscope was removed.  Next attention turned to the Bulkamid procedure.  The Bulkamid scope was assembled and Bulkamid was injected in a circumferential fashion around the proximal urethra.  This started at 5:00 and proceeded to 7:00, 11:00 and 1:00 until blebs were touching.  The patient's bladder was decompressed.  The scope was removed.  She emerged from anesthesia and sent to the PACU in stable  condition.    Plan:  Discharge home with follow up in 2 weeks

## 2022-06-02 NOTE — Anesthesia Preprocedure Evaluation (Addendum)
Anesthesia Evaluation  Patient identified by MRN, date of birth, ID band Patient awake    Reviewed: Allergy & Precautions, NPO status , Patient's Chart, lab work & pertinent test results  Airway Mallampati: III  TM Distance: >3 FB Neck ROM: Full    Dental no notable dental hx.    Pulmonary former smoker,    Pulmonary exam normal        Cardiovascular + CAD, + Past MI, + Cardiac Stents and +CHF  Normal cardiovascular exam     Neuro/Psych PSYCHIATRIC DISORDERS Depression negative neurological ROS     GI/Hepatic negative GI ROS, Neg liver ROS,   Endo/Other  diabetes, Oral Hypoglycemic Agents, Insulin DependentHypothyroidism   Renal/GU Renal disease     Musculoskeletal  (+) Arthritis ,   Abdominal   Peds  Hematology  (+) Blood dyscrasia, anemia ,   Anesthesia Other Findings OVERACTIVE BLADDER, STRESS URINARY INCONTINENCE  Reproductive/Obstetrics                            Anesthesia Physical Anesthesia Plan  ASA: 3  Anesthesia Plan: MAC   Post-op Pain Management:    Induction: Intravenous  PONV Risk Score and Plan: 2 and Ondansetron, Dexamethasone, Propofol infusion and Treatment may vary due to age or medical condition  Airway Management Planned: Simple Face Mask  Additional Equipment:   Intra-op Plan:   Post-operative Plan:   Informed Consent: I have reviewed the patients History and Physical, chart, labs and discussed the procedure including the risks, benefits and alternatives for the proposed anesthesia with the patient or authorized representative who has indicated his/her understanding and acceptance.     Dental advisory given  Plan Discussed with: CRNA  Anesthesia Plan Comments:        Anesthesia Quick Evaluation

## 2022-06-02 NOTE — Anesthesia Postprocedure Evaluation (Signed)
Anesthesia Post Note  Patient: [name]  Procedure(s) Performed: CYSTOSCOPY AND BULKAMID BOTOX INJECTION 75 UNITS     Patient location during evaluation: PACU Anesthesia Type: MAC Level of consciousness: awake Pain management: pain level controlled Vital Signs Assessment: post-procedure vital signs reviewed and stable Respiratory status: spontaneous breathing, nonlabored ventilation, respiratory function stable and patient connected to nasal cannula oxygen Cardiovascular status: blood pressure returned to baseline and stable Postop Assessment: no apparent nausea or vomiting Anesthetic complications: no   No notable events documented.  Last Vitals:  Vitals:   06/02/22 1518 06/02/22 1528  BP: (!) 189/82 (!) 165/66  Pulse: 67 68  Resp: 16 16  Temp:    SpO2: 100% 99%    Last Pain:  Vitals:   06/02/22 1528  TempSrc:   PainSc: 0-No pain                 Scherrie Seneca P Trebor Galdamez

## 2022-06-02 NOTE — H&P (Signed)
CC/HPI: cc: Urinary urgency/urge incontinence/nocturia   04/20/2022: 88 year old woman who was previously seeing Dr. Matilde Sprang for urinary urgency and urge incontinence here to discuss management. She is on Gemtesa and has improved slightly but not significantly. She changes close or pads 3-4 times a day and 1-2 times a night. She wears depends overnight and is very wet in the morning. Patient is on Plavix 3 times a week. She is here today with her daughter who she lives with. They have spoken about peripheral tibial nerve stimulation in the past but do not think is a good choice for them. She drinks 1 cup of coffee in the morning followed by water or sugar-free drink.   Overactive bladder validated question screener: 4, 0, 5, 5, 4, 5, 3, 4=30/40   05/15/2022: 88 year old woman with urinary urgency and urge incontinence here for follow-up. Urodynamic study showed significant overactive bladder as well as stress urinary incontinence. She has not had significant improvement with Gemtesa. She is here today with her daughter Karen Kitchens.   UDS SUMMARY  Ms. Petkus held a max capacity of approx. 75 mls. There was positive SUI. It was difficult to access but she did leak a mild amount from Valsalva around 100 mls inserted. There was positive instability. Several unstable contractions were noted. Some of these were consecutive. She did not generate a voluntary contraction and void. Again very difficult to get a voiding phase since she leaked quite a but during the filling phase. She expressed that she normally has no problems emptying her bladder. No trabeculation was noted. No reflux was noted.     ALLERGIES: Lisinopril Nitrofurantoin    MEDICATIONS: Gemtesa 75 mg tablet 1 tablet PO Daily  Gemtesa 75 mg tablet 1 tablet PO Daily  Oxybutynin Chloride Er 10 mg tablet, extended release 24 hr 1 tablet PO Daily  Clopidogrel  Donepezil Hcl  Furosemide  Levothyroxine  Lyumjev Kwikpen U-200 200 unit/ml (3 ml)  insulin pen  Sertraline Hcl  Tresiba Flextouch U-100 100 unit/ml (3 ml) insulin pen  Vitamin D3     GU PSH: Complex cystometrogram, w/ void pressure and urethral pressure profile studies, any technique - 05/06/2022 Complex Uroflow - 05/06/2022 Emg surf Electrd - 05/06/2022 Hysterectomy Inject For cystogram - 05/06/2022 Intrabd voidng Press - 05/06/2022       PSH Notes: Blockage in right leg Oct 31, 2021   NON-GU PSH: Carotid Endarterectomy Hip Replacement     GU PMH: Urge incontinence - 05/06/2022, - 04/20/2022, - 01/21/2022, - 04/16/2021, - 03/06/2021 Nocturia - 04/20/2022, - 01/21/2022, - 03/06/2021 Nocturnal Enuresis - 04/20/2022, - 07/11/2021, - 04/16/2021, - 03/06/2021 Urinary Frequency - 04/20/2022, - 07/11/2021, - 03/06/2021 Urinary Urgency - 04/20/2022 Chronic Kidney Disease    NON-GU PMH: Anxiety Arthritis Congestive heart failure Depression Diabetes Type 2 Hyperthyroidism Hypothyroidism Myocardial Infarction    FAMILY HISTORY: 1 son - Son 3 daughters - Daughter father deceased - Father kidney disease - Sister mother deceased - Mother   SOCIAL HISTORY: Marital Status: Widowed Preferred Language: English; Ethnicity: Not Hispanic Or Latino Current Smoking Status: Patient does not smoke anymore.   Tobacco Use Assessment Completed: Used Tobacco in last 30 days? Does not use smokeless tobacco. Has never drank.  Does not drink caffeine.    REVIEW OF SYSTEMS:    GU Review Female:   Patient denies frequent urination, hard to postpone urination, burning /pain with urination, get up at night to urinate, leakage of urine, stream starts and stops, trouble starting your stream, have to  strain to urinate, and being pregnant.  Gastrointestinal (Upper):   Patient denies nausea, vomiting, and indigestion/ heartburn.  Gastrointestinal (Lower):   Patient denies diarrhea and constipation.  Constitutional:   Patient denies fever, night sweats, weight loss, and fatigue.  Skin:   Patient  denies skin rash/ lesion and itching.  Eyes:   Patient denies blurred vision and double vision.  Ears/ Nose/ Throat:   Patient denies sore throat and sinus problems.  Hematologic/Lymphatic:   Patient denies swollen glands and easy bruising.  Cardiovascular:   Patient denies leg swelling and chest pains.  Respiratory:   Patient denies cough and shortness of breath.  Endocrine:   Patient denies excessive thirst.  Musculoskeletal:   Patient denies back pain and joint pain.  Neurological:   Patient denies dizziness and headaches.  Psychologic:   Patient denies depression and anxiety.   VITAL SIGNS: None   MULTI-SYSTEM PHYSICAL EXAMINATION:    Constitutional: Well-nourished. No physical deformities. Normally developed. Good grooming.  Neck: Neck symmetrical, not swollen. Normal tracheal position.  Respiratory: No labored breathing, no use of accessory muscles.   Skin: No paleness, no jaundice, no cyanosis. No lesion, no ulcer, no rash.  Neurologic / Psychiatric: Oriented to time, oriented to place, oriented to person. No depression, no anxiety, no agitation.  Eyes: Normal conjunctivae. Normal eyelids.  Ears, Nose, Mouth, and Throat: Left ear no scars, no lesions, no masses. Right ear no scars, no lesions, no masses. Nose no scars, no lesions, no masses. Normal hearing. Normal lips.  Musculoskeletal: Normal gait and station of head and neck.     Complexity of Data:  Records Review:   Previous Patient Records, POC Tool  Urine Test Review:   Urinalysis  Notes:                     01/08/2022: BUN 22, creatinine 1.56   PROCEDURES:          Urinalysis Dipstick Dipstick Cont'd  Color: Yellow Bilirubin: Neg mg/dL  Appearance: Clear Ketones: Neg mg/dL  Specific Gravity: 1.025 Blood: Neg ery/uL  pH: <=5.0 Protein: Neg mg/dL  Glucose: Trace mg/dL Urobilinogen: 0.2 mg/dL    Nitrites: Neg    Leukocyte Esterase: Neg leu/uL    ASSESSMENT:      ICD-10 Details  1 GU:   Urinary Frequency - R35.0  Chronic, Stable  2   Urinary Urgency - R39.15 Chronic, Stable  3   Mixed incontinence - N39.46 Chronic, Stable   PLAN:           Document Letter(s):  Created for Patient: Clinical Summary         Notes:   Mixed urinary incontinence:  -Reviewed patient's urodynamic study that shows both overactive bladder and stress urinary incontinence  -Patient has not had any response to [facility] and is changing pads multiple times a day  -I discussed with patient the possibility of intravesical Botox injections and Bulkamid at the same time; while this can be done in the office I am afraid she will be able to hold lidocaine in her bladder due to the amount of contraction she had during the urodynamic study feeling  -We discussed proceeding under sedation in the OR  -We did talk about the possibility of urinary retention with approximately 3 to 8% risk and possible need for self-catheterization. We also discussed the risk of bleeding, burning with urination and infection.

## 2022-06-03 ENCOUNTER — Encounter (HOSPITAL_COMMUNITY): Payer: Self-pay | Admitting: Urology

## 2022-06-15 ENCOUNTER — Encounter: Payer: Self-pay | Admitting: Family Medicine

## 2022-07-30 ENCOUNTER — Inpatient Hospital Stay (HOSPITAL_COMMUNITY): Admission: RE | Admit: 2022-07-30 | Payer: Medicare Other | Source: Ambulatory Visit

## 2022-08-11 ENCOUNTER — Emergency Department (HOSPITAL_COMMUNITY)
Admission: EM | Admit: 2022-08-11 | Discharge: 2022-08-11 | Disposition: A | Discharge status: Home / Self Care | Payer: Medicare Other | Attending: Emergency Medicine | Admitting: Emergency Medicine

## 2022-08-11 ENCOUNTER — Other Ambulatory Visit: Payer: Self-pay

## 2022-08-11 ENCOUNTER — Emergency Department (HOSPITAL_COMMUNITY): Payer: Medicare Other

## 2022-08-11 DIAGNOSIS — W01198A Fall on same level from slipping, tripping and stumbling with subsequent striking against other object, initial encounter: Secondary | ICD-10-CM | POA: Diagnosis not present

## 2022-08-11 DIAGNOSIS — Y9301 Activity, walking, marching and hiking: Secondary | ICD-10-CM | POA: Insufficient documentation

## 2022-08-11 DIAGNOSIS — Z7982 Long term (current) use of aspirin: Secondary | ICD-10-CM | POA: Diagnosis not present

## 2022-08-11 DIAGNOSIS — W19XXXA Unspecified fall, initial encounter: Secondary | ICD-10-CM

## 2022-08-11 DIAGNOSIS — S0101XA Laceration without foreign body of scalp, initial encounter: Secondary | ICD-10-CM | POA: Diagnosis not present

## 2022-08-11 DIAGNOSIS — Z7902 Long term (current) use of antithrombotics/antiplatelets: Secondary | ICD-10-CM | POA: Insufficient documentation

## 2022-08-11 DIAGNOSIS — S0990XA Unspecified injury of head, initial encounter: Secondary | ICD-10-CM | POA: Diagnosis present

## 2022-08-11 DIAGNOSIS — Z23 Encounter for immunization: Secondary | ICD-10-CM | POA: Diagnosis not present

## 2022-08-11 MED ORDER — TETANUS-DIPHTH-ACELL PERTUSSIS 5-2.5-18.5 LF-MCG/0.5 IM SUSY
0.5000 mL | PREFILLED_SYRINGE | Freq: Once | INTRAMUSCULAR | Status: AC
  Administered 2022-08-11: 0.5 mL via INTRAMUSCULAR
  Filled 2022-08-11: qty 0.5

## 2022-08-11 MED ORDER — LIDOCAINE-EPINEPHRINE (PF) 2 %-1:200000 IJ SOLN
10.0000 mL | Freq: Once | INTRAMUSCULAR | Status: AC
  Administered 2022-08-11: 10 mL via INTRADERMAL
  Filled 2022-08-11: qty 20

## 2022-08-11 NOTE — ED Notes (Signed)
Ct contacted, no open tables at the time

## 2022-08-11 NOTE — ED Notes (Signed)
ED Provider at bedside. 

## 2022-08-11 NOTE — ED Provider Notes (Signed)
[facility], [place] EMERGENCY DEPARTMENT Provider Note   CSN: [id number] Arrival date & time: 08/11/22  1426     History  Chief Complaint  Patient presents with   Fall    Fall on thinners      [name] is a 88 y.o. female.  88 yo F with a cc of a fall.  The patient was coming out of the bathroom when she went to turn quickly and she think she lost her balance and fell to the left.  She struck the back of her head.  She denies significant injury.  Denies neck pain denies chest pain denies abdominal pain.  She had some trouble getting off the ground.  EMS was called.  She does take Plavix.   Fall       Home Medications Prior to Admission medications   Medication Sig Start Date End Date Taking? Authorizing Provider  acetaminophen (TYLENOL) 500 MG tablet Take 500 mg by mouth every 6 (six) hours as needed for moderate pain.    [provider]  aspirin EC 81 MG tablet Take 1 tablet (81 mg total) by mouth daily. Swallow whole. 09/23/21   Wellington Hampshire, MD  Biotin 1000 MCG tablet Take 1,000 mcg by mouth daily.    [provider]  Cholecalciferol (VITAMIN D3 SUPER STRENGTH) 50 MCG (2000 UT) TABS Take 2,000 Units by mouth daily.    [provider]  clopidogrel (PLAVIX) 75 MG tablet Take 75 mg by mouth every Monday, Wednesday, and Friday.     [provider]  donepezil (ARICEPT) 10 MG tablet Take 1 tablet (10 mg total) by mouth at bedtime. 04/27/22   Lomax, Amy, NP  furosemide (LASIX) 20 MG tablet Take 1 tablet (20 mg total) by mouth daily as needed (weight gain 2-3 lbs.). Patient taking differently: Take 20 mg by mouth See admin instructions. Take 20 mg every other day as needed for weight gain 2-3 lbs. 12/06/20   O'Neal, Cassie Freer, MD  GVOKE HYPOPEN 2-PACK 1 MG/0.2ML SOAJ Inject 1 mg into the skin as needed. Give in case of hypoglycemia. May repeat dose if there has been no response after 15 minutes. 05/21/22   [provider]  levothyroxine (SYNTHROID) 25 MCG tablet Take 25 mcg by mouth daily before breakfast.    [provider]  LYUMJEV KWIKPEN 100 UNIT/ML KwikPen Inject 2 Units into the skin daily. 03/03/22   [provider]  sertraline (ZOLOFT) 100 MG tablet Take 100 mg by mouth at bedtime.    [provider]  TRESIBA FLEXTOUCH 100 UNIT/ML FlexTouch Pen Inject 6 Units into the skin at bedtime. 04/20/22   [provider]  Vibegron (GEMTESA) 75 MG TABS Take 75 mg by mouth daily.    [provider]      Allergies    Tape, Lisinopril, Morphine and related, Nitrofuran derivatives, and Other    Review of Systems   Review of Systems  Physical Exam Updated Vital Signs BP (!) 174/72 (BP Location: Right Arm)   Pulse 69   Temp 98.5 F (36.9 C) (Oral)   Resp 15   Ht '5\' 7"'$  (1.702 m)   Wt 49.9 kg   SpO2 98%   BMI 17.23 kg/m  Physical Exam Vitals and nursing note reviewed.  Constitutional:      General: She is not in acute distress.    Appearance: She is well-developed. She is not diaphoretic.  HENT:     Head: Normocephalic.  Comments: Approx 2.7 cm laceration to the occiput more on the left than the right. Eyes:     Pupils: Pupils are equal, round, and reactive to light.  Cardiovascular:     Rate and Rhythm: Normal rate and regular rhythm.     Heart sounds: No murmur heard.    No friction rub. No gallop.  Pulmonary:     Effort: Pulmonary effort is normal.     Breath sounds: No wheezing or rales.  Abdominal:     General: There is no distension.     Palpations: Abdomen is soft.     Tenderness: There is no abdominal tenderness.  Musculoskeletal:        General: No tenderness.     Cervical back: Normal range of motion and neck supple.  Skin:    General: Skin is warm and dry.  Neurological:     Mental Status: She is alert and oriented to person, place, and time.  Psychiatric:        Behavior: Behavior normal.     ED Results /  Procedures / Treatments   Labs (all labs ordered are listed, but only abnormal results are displayed) Labs Reviewed - No data to display  EKG None  Radiology CT Head Wo Contrast  Result Date: 08/11/2022 CLINICAL DATA:  Fall and hit back of head. EXAM: CT HEAD WITHOUT CONTRAST TECHNIQUE: Contiguous axial images were obtained from the base of the skull through the vertex without intravenous contrast. RADIATION DOSE REDUCTION: This exam was performed according to the departmental dose-optimization program which includes automated exposure control, adjustment of the mA and/or kV according to patient size and/or use of iterative reconstruction technique. COMPARISON:  Brain MRI 05/17/2019, CT head 08/27/2019 FINDINGS: Brain: There is no acute intracranial hemorrhage, extra-axial fluid collection, or acute infarct. Background parenchymal volume is within expected limits for age. The ventricles are stable in size. Patchy hypodensity in the supratentorial white matter likely reflects sequela of underlying chronic small-vessel ischemic change There is no mass lesion.  There is no mass effect or midline shift. Vascular: There is calcification of the bilateral carotid siphons. Skull: Normal. Negative for fracture or focal lesion. Sinuses/Orbits: The paranasal sinuses are clear. Bilateral lens implants are in place. The globes and orbits are otherwise unremarkable. Other: None. IMPRESSION: No acute intracranial pathology. Electronically Signed   By: Valetta Mole M.D.   On: 08/11/2022 15:09    Procedures .Marland KitchenLaceration Repair  Date/Time: 08/11/2022 3:19 PM  Performed by: Deno Etienne, DO Authorized by: Deno Etienne, DO   Consent:    Consent obtained:  Verbal   Consent given by:  Patient (daughter)   Risks, benefits, and alternatives were discussed: yes     Risks discussed:  Infection, pain, poor cosmetic result and poor wound healing   Alternatives discussed:  No treatment, delayed treatment and  observation Universal protocol:    Procedure explained and questions answered to patient or proxy's satisfaction: yes     Immediately prior to procedure, a time out was called: yes     Patient identity confirmed:  Verbally with patient Anesthesia:    Anesthesia method:  Local infiltration   Local anesthetic:  Lidocaine 2% WITH epi Laceration details:    Location:  Scalp   Length (cm):  2.7 Pre-procedure details:    Preparation:  Patient was prepped and draped in usual sterile fashion Exploration:    Limited defect created (wound extended): no     Hemostasis achieved with:  Epinephrine and direct pressure  Imaging obtained comment:  CT   Imaging outcome: foreign body not noted     Wound exploration: entire depth of wound visualized     Contaminated: no   Treatment:    Area cleansed with:  Saline   Amount of cleaning:  Standard   Irrigation solution:  Sterile saline   Irrigation volume:  100   Irrigation method:  Syringe   Visualized foreign bodies/material removed: no     Debridement:  None   Undermining:  None Skin repair:    Repair method:  Staples   Number of staples:  3 Approximation:    Approximation:  Close Repair type:    Repair type:  Simple Post-procedure details:    Dressing:  Open (no dressing)   Procedure completion:  Tolerated well, no immediate complications     Medications Ordered in ED Medications  Tdap (BOOSTRIX) injection 0.5 mL (has no administration in time range)  lidocaine-EPINEPHrine (XYLOCAINE W/EPI) 2 %-1:200000 (PF) injection 10 mL (10 mLs Intradermal Given 08/11/22 1506)    ED Course/ Medical Decision Making/ A&P                           Medical Decision Making Amount and/or Complexity of Data Reviewed Radiology: ordered.  Risk Prescription drug management.   88 yo F with a chief complaints of a fall.  Nonsyncopal by history.  Complaining of striking her head.  She has no other obvious injury on exam.  She has no midline spinal  tenderness is able to rotate her head 45 degrees direction without pain.  Will obtain a CT scan of the head.  Repair of the wound at bedside.  Reassess.  CT of the head independently interpreted by me without intercranial hemorrhage.  Radiology also without acute intracranial pathology.  Wound was repaired at bedside.  Discharge home.  3:21 PM:  I have discussed the diagnosis/risks/treatment options with the patient and family.  Evaluation and diagnostic testing in the emergency department does not suggest an emergent condition requiring admission or immediate intervention beyond what has been performed at this time.  They will follow up with PCP. We also discussed returning to the ED immediately if new or worsening sx occur. We discussed the sx which are most concerning (e.g., sudden worsening pain, fever, inability to tolerate by mouth) that necessitate immediate return. Medications administered to the patient during their visit and any new prescriptions provided to the patient are listed below.  Medications given during this visit Medications  Tdap (BOOSTRIX) injection 0.5 mL (has no administration in time range)  lidocaine-EPINEPHrine (XYLOCAINE W/EPI) 2 %-1:200000 (PF) injection 10 mL (10 mLs Intradermal Given 08/11/22 1506)     The patient appears reasonably screen and/or stabilized for discharge and I doubt any other medical condition or other [facility] requiring further screening, evaluation, or treatment in the ED at this time prior to discharge.          Final Clinical Impression(s) / ED Diagnoses Final diagnoses:  Laceration of scalp, initial encounter  Fall, initial encounter    Rx / DC Orders ED Discharge Orders     None         Deno Etienne, DO 08/11/22 1521

## 2022-08-11 NOTE — Discharge Instructions (Signed)
Return for redness drainage or if you get a fever.  The staples that were placed should be removed sometime between day 7 and 10.  The area can get wet but not fully immersed underwater.  No scrubbing.  If you really want to clean it you can apply a half-and-half hydrogen peroxide solution with water on a Q-tip.  You can apply an ointment a couple times a day this could be as simple as Vaseline but could also be an antibiotic ointment if you wish.   It is very uncommon but if you develop worsening headache confusion or vomiting then you need to return to the emergency department for repeat evaluation.  Please call your family doctor.  They typically want evaluate you after you have suffered a fall and evaluate your medications to see if they can make any changes or perhaps recommend physical therapy or have someone visit your house to ensure it is safe that can be.

## 2022-08-11 NOTE — ED Triage Notes (Signed)
38y old female, walks with walker, lose balance and fell, hit the back of the head with door, Laceration present. Denies pain or LOC. Pt on plavix

## 2022-08-12 NOTE — Progress Notes (Signed)
Trauma Response Nurse Documentation   Shoua Ulloa is a 88 y.o. female arriving to [facility] ED via EMS  On clopidogrel 75 mg daily. Trauma was activated as a Level 2 based on the following trauma criteria Elderly patients > 65 with head trauma on anti-coagulation (excluding ASA). Trauma team at the bedside on patient arrival.   Patient cleared for CT by Dr. Tyrone Nine. Pt transported to CT with trauma response nurse present to monitor. RN remained with the patient throughout their absence from the department for clinical observation. GCS 15.  History   Past Medical History:  Diagnosis Date   Arthritis    Cancer ([place])    hx of skin cancer   CHF (congestive heart failure) ([place])    Complication of anesthesia    no narcotics per daughter   Coronary artery disease    Depression    DM (diabetes mellitus) ([place])    GERD (gastroesophageal reflux disease)    Hyperlipidemia    Hypothyroidism    Myocardial infarct [facility]) 2012   Renal disorder      Past Surgical History:  Procedure Laterality Date   ABDOMINAL AORTOGRAM W/LOWER EXTREMITY N/A 10/15/2021   Procedure: ABDOMINAL AORTOGRAM W/LOWER EXTREMITY;  Surgeon: Wellington Hampshire, MD;  Location: [place] [facility];  Service: Cardiovascular;  Laterality: N/A;   BOTOX INJECTION N/A 06/02/2022   Procedure: BOTOX INJECTION 75 UNITS;  Surgeon: Robley Fries, MD;  Location: [facility];  Service: Urology;  Laterality: N/A;   CARDIAC CATHETERIZATION     CAROTID ENDARTERECTOMY     CORONARY ANGIOPLASTY     CORONARY STENT PLACEMENT     CYSTOSCOPY N/A 06/02/2022   Procedure: CYSTOSCOPY AND BULKAMID;  Surgeon: Robley Fries, MD;  Location: [facility];  Service: Urology;  Laterality: N/A;  45 MINS   IR THORACENTESIS ASP PLEURAL SPACE W/IMG GUIDE  08/13/2020   PERIPHERAL VASCULAR BALLOON ANGIOPLASTY  10/15/2021   Procedure: PERIPHERAL VASCULAR BALLOON ANGIOPLASTY;  Surgeon: Wellington Hampshire, MD;  Location: [place] [facility];  Service: Cardiovascular;;    TOTAL VAGINAL HYSTERECTOMY       Initial Focused Assessment (If applicable, or please see trauma documentation): Patient A&Ox4, GCS 15, PERR 3 Lac to head, bleeding controlled  CT's Completed:   CT Head   Interventions:  IV, labs CT Head Lac repair Tdap  Plan for disposition:  Discharge home   Event Summary: Patient to ED after a mechanical fall at home. Patient takes plavix, hit the back of her head and was repaired by EDP. Imaging was negative for any injury, patient to discharge home.  Bedside handoff with ED RN Clifton James.    Park Pope Dorthy Magnussen  Trauma Response RN  Please call TRN at [phone] for further assistance.

## 2022-08-21 ENCOUNTER — Other Ambulatory Visit: Payer: Self-pay | Admitting: Neurology

## 2022-08-21 DIAGNOSIS — F0390 Unspecified dementia without behavioral disturbance: Secondary | ICD-10-CM

## 2022-09-01 NOTE — Progress Notes (Unsigned)
Cardiology Office Note:   Date:  09/03/2022  NAME:  [name]    MRN: [id number] DOB:  1932-04-12   PCP:  Michael Boston, MD  Cardiologist:  Evalina Field, MD  Electrophysiologist:  None   Referring MD: Michael Boston, MD   Chief Complaint  Patient presents with   Follow-up        History of Present Illness:   [name] is a 88 y.o. female with a hx of diastolic heart failure, PAD, carotid artery disease, hyperlipidemia, diabetes who presents for follow-up.  She presents with her daughter.  She reports nausea over the past few days.  She reports no chest pain or trouble breathing.  Her weight is actually down 7 pounds.  She is not in heart failure.  Her heart rhythm is normal.  No murmurs on examination.  Her lipids are elevated.  Off her statin.  LDL elevated.  Blood pressure well-controlled.  No other complaints.  Her diabetes is poorly controlled.  I wonder if this is contributing to her nausea.  Recent thyroid studies are normal as well.  Her cardiovascular disease appears to be stable.  Problem List 1. Recurrent L pleural effusion  -s/p pleurex catheter 2.  CAD  -status post stent in 2007/2012  3.  Hypertension 4.  Hyperlipidemia -Total cholesterol 159, HDL 57, LDL 84, triglycerides 92 5.  Arthritis 6.  Esophageal dilation 7.  Carotid artery stenosis status post right CEA -L ICA 40-59% 8. DM -A1c 6.9 9. HFpEF -EF 55-60% -BNP 1055 10. Dementia  11. PAD -angioplasty R popliteal artery 10/15/2021  Past Medical History: Past Medical History:  Diagnosis Date   Arthritis    Cancer ([place])    hx of skin cancer   CHF (congestive heart failure) ([facility])    Complication of anesthesia    no narcotics per daughter   Coronary artery disease    Depression    DM (diabetes mellitus) ([place])    GERD (gastroesophageal reflux disease)    Hyperlipidemia    Hypothyroidism    Myocardial infarct [facility]) 2012   Renal disorder     Past Surgical History: Past Surgical  History:  Procedure Laterality Date   ABDOMINAL AORTOGRAM W/LOWER EXTREMITY N/A 10/15/2021   Procedure: ABDOMINAL AORTOGRAM W/LOWER EXTREMITY;  Surgeon: Wellington Hampshire, MD;  Location: [place] [facility];  Service: Cardiovascular;  Laterality: N/A;   BOTOX INJECTION N/A 06/02/2022   Procedure: BOTOX INJECTION 75 UNITS;  Surgeon: Robley Fries, MD;  [facility];  Service: Urology;  Laterality: N/A;   CARDIAC CATHETERIZATION     CAROTID ENDARTERECTOMY     CORONARY ANGIOPLASTY     CORONARY STENT PLACEMENT     CYSTOSCOPY N/A 06/02/2022   Procedure: CYSTOSCOPY AND BULKAMID;  Surgeon: Robley Fries, MD;  [facility];  Service: Urology;  Laterality: N/A;  45 MINS   IR THORACENTESIS ASP PLEURAL SPACE W/IMG GUIDE  08/13/2020   PERIPHERAL VASCULAR BALLOON ANGIOPLASTY  10/15/2021   Procedure: PERIPHERAL VASCULAR BALLOON ANGIOPLASTY;  Surgeon: Wellington Hampshire, MD;  Location: [place] [facility];  Service: Cardiovascular;;   TOTAL VAGINAL HYSTERECTOMY      Current Medications: Current Meds  Medication Sig   acetaminophen (TYLENOL) 500 MG tablet Take 500 mg by mouth every 6 (six) hours as needed for moderate pain.   aspirin EC 81 MG tablet Take 1 tablet (81 mg total) by mouth daily. Swallow whole.   atorvastatin (LIPITOR) 40 MG tablet Take 1 tablet (  40 mg total) by mouth daily.   Biotin 1000 MCG tablet Take 1,000 mcg by mouth daily.   Cholecalciferol (VITAMIN D3 SUPER STRENGTH) 50 MCG (2000 UT) TABS Take 2,000 Units by mouth daily.   clopidogrel (PLAVIX) 75 MG tablet Take 75 mg by mouth every Monday, Wednesday, and Friday.    donepezil (ARICEPT) 10 MG tablet TAKE 1 TABLET BY MOUTH AT  BEDTIME   furosemide (LASIX) 20 MG tablet Take 1 tablet (20 mg total) by mouth daily as needed (weight gain 2-3 lbs.). (Patient taking differently: Take 20 mg by mouth See admin instructions. Take 20 mg every other day as needed for weight gain 2-3 lbs.)   levothyroxine (SYNTHROID) 25 MCG tablet Take  25 mcg by mouth daily before breakfast.   LYUMJEV KWIKPEN 100 UNIT/ML KwikPen Inject 2 Units into the skin daily.   sertraline (ZOLOFT) 100 MG tablet Take 100 mg by mouth at bedtime.   TRESIBA FLEXTOUCH 100 UNIT/ML FlexTouch Pen Inject 6 Units into the skin at bedtime.   Vibegron (GEMTESA) 75 MG TABS Take 75 mg by mouth daily.     Allergies:    Tape, Lisinopril, Morphine and related, Nitrofuran derivatives, and Other   Social History: Social History   Socioeconomic History   Marital status: Widowed    Spouse name: Not on file   Number of children: Not on file   Years of education: Not on file   Highest education level: Not on file  Occupational History   Not on file  Tobacco Use   Smoking status: Former    Packs/day: 1.00    Years: 2.00    Total pack years: 2.00    Types: Cigarettes    Quit date: 11/15/1969    Years since quitting: 52.8   Smokeless tobacco: Never  Vaping Use   Vaping Use: Never used  Substance and Sexual Activity   Alcohol use: Never   Drug use: Never   Sexual activity: Not on file  Other Topics Concern   Not on file  Social History Narrative   Not on file   Social Determinants of Health   Financial Resource Strain: Not on file  Food Insecurity: No Food Insecurity (08/14/2020)   Hunger Vital Sign    Worried About Running Out of Food in the Last Year: Never true    Ran Out of Food in the Last Year: Never true  Transportation Needs: No Transportation Needs (08/14/2020)   PRAPARE - Hydrologist (Medical): No    Lack of Transportation (Non-Medical): No  Physical Activity: Not on file  Stress: Not on file  Social Connections: Not on file     Family History: The patient's family history includes Alzheimer's disease in her mother; Heart attack in her father; Heart disease in her father; Heart failure in her brother; Lymphoma in her sister.  ROS:   All other ROS reviewed and negative. Pertinent positives noted in the  HPI.     EKGs/Labs/Other Studies Reviewed:   The following studies were personally reviewed by me today:  Recent Labs: 05/26/2022: BUN 26; Creatinine, Ser 1.16; Hemoglobin 11.2; Platelets 204; Potassium 4.7; Sodium 139   Recent Lipid Panel No results found for: "CHOL", "TRIG", "HDL", "CHOLHDL", "VLDL", "LDLCALC", "LDLDIRECT"  Physical Exam:   VS:  BP 138/62   Pulse 73   Ht 5' 5.5" (1.664 m)   Wt 104 lb 3.2 oz (47.3 kg)   SpO2 97%   BMI 17.08 kg/m  Wt Readings from Last 3 Encounters:  09/03/22 104 lb 3.2 oz (47.3 kg)  08/11/22 110 lb (49.9 kg)  06/02/22 111 lb (50.3 kg)    General: Well nourished, well developed, in no acute distress Head: Atraumatic, normal size  Eyes: PEERLA, EOMI  Neck: Supple, no JVD Endocrine: No thryomegaly Cardiac: Normal S1, S2; RRR; no murmurs, rubs, or gallops Lungs: Clear to auscultation bilaterally, no wheezing, rhonchi or rales  Abd: Soft, nontender, no hepatomegaly  Ext: No edema, pulses 2+ Musculoskeletal: No deformities, BUE and BLE strength normal and equal Skin: Warm and dry, no rashes   Neuro: Alert and oriented to person, place, time, and situation, CNII-XII grossly intact, no focal deficits  Psych: Normal mood and affect   ASSESSMENT:   Ciria Bernardini is a 88 y.o. female who presents for the following: 1. PAD (peripheral artery disease) ([place])   2. Hyperlipidemia, unspecified hyperlipidemia type   3. Chronic diastolic heart failure ([place])   4. Bilateral carotid artery stenosis   5. Mixed hyperlipidemia     PLAN:   1. PAD (peripheral artery disease) (H[facility]) -Status post peripheral intervention.  Also with carotid artery disease.  Close monitoring.  Continue aspirin 81 mg daily.  Also on Plavix.  We will restart her Lipitor 40 mg daily.  LDL was uncontrolled on last check.  She will need yearly follow-up from her carotids as well as her lower extremity arterial duplexes.  2. Hyperlipidemia, unspecified hyperlipidemia type -Restart  statin.  3. Chronic diastolic heart failure (H[facility]) -Euvolemic.  Blood pressure control.  She will take Lasix as needed.  4. Bilateral carotid artery stenosis -Status post right CEA.  Monitoring right ICA.     Disposition: Return in about 6 months (around 03/05/2023).  Medication Adjustments/Labs and Tests Ordered: Current medicines are reviewed at length with the patient today.  Concerns regarding medicines are outlined above.  No orders of the defined types were placed in this encounter.  Meds ordered this encounter  Medications   atorvastatin (LIPITOR) 40 MG tablet    Sig: Take 1 tablet (40 mg total) by mouth daily.    Dispense:  90 tablet    Refill:  3    Patient Instructions  Medication Instructions:  START Lipitor 40 mg daily   *If you need a refill on your cardiac medications before your next appointment, please call your pharmacy*   Follow-Up: At [facility], you and your health needs are our priority.  As part of our continuing mission to provide you with exceptional heart care, we have created designated Provider Care Teams.  These Care Teams include your primary Cardiologist (physician) and Advanced Practice Providers (APPs -  Physician Assistants and Nurse Practitioners) who all work together to provide you with the care you need, when you need it.  We recommend signing up for the patient portal called "MyChart".  Sign up information is provided on this After Visit Summary.  MyChart is used to connect with patients for Virtual Visits (Telemedicine).  Patients are able to view lab/test results, encounter notes, upcoming appointments, etc.  Non-urgent messages can be sent to your provider as well.   To learn more about what you can do with MyChart, go to [email or web address].    Your next appointment:   6 month(s)  The format for your next appointment:   In Person  Provider:   Evalina Field, MD            Time Spent with Patient: I have spent  a total of 25 minutes with patient reviewing hospital notes, telemetry, EKGs, labs and examining the patient as well as establishing an assessment and plan that was discussed with the patient.  > 50% of time was spent in direct patient care.  Signed, Addison Naegeli. Audie Box, MD, [place]  [address] [place], [place] [address] [phone]  09/03/2022 4:55 PM

## 2022-09-03 ENCOUNTER — Encounter: Payer: Self-pay | Admitting: Cardiovascular Disease

## 2022-09-03 ENCOUNTER — Ambulatory Visit: Payer: Medicare Other | Attending: Cardiovascular Disease | Admitting: Cardiovascular Disease

## 2022-09-03 VITALS — BP 138/62 | HR 73 | Ht 65.5 in | Wt 104.2 lb

## 2022-09-03 DIAGNOSIS — E785 Hyperlipidemia, unspecified: Secondary | ICD-10-CM | POA: Diagnosis not present

## 2022-09-03 DIAGNOSIS — I739 Peripheral vascular disease, unspecified: Secondary | ICD-10-CM | POA: Diagnosis not present

## 2022-09-03 DIAGNOSIS — I5032 Chronic diastolic (congestive) heart failure: Secondary | ICD-10-CM | POA: Insufficient documentation

## 2022-09-03 DIAGNOSIS — E782 Mixed hyperlipidemia: Secondary | ICD-10-CM | POA: Insufficient documentation

## 2022-09-03 DIAGNOSIS — I6523 Occlusion and stenosis of bilateral carotid arteries: Secondary | ICD-10-CM | POA: Diagnosis not present

## 2022-09-03 MED ORDER — ATORVASTATIN CALCIUM 40 MG PO TABS: 40.0000 mg | ORAL_TABLET | Freq: Every day | ORAL | 3 refills | Status: DC

## 2022-09-03 NOTE — Patient Instructions (Signed)
Medication Instructions:  START Lipitor 40 mg daily   *If you need a refill on your cardiac medications before your next appointment, please call your pharmacy*   Follow-Up: At [facility], you and your health needs are our priority.  As part of our continuing mission to provide you with exceptional heart care, we have created designated Provider Care Teams.  These Care Teams include your primary Cardiologist (physician) and Advanced Practice Providers (APPs -  Physician Assistants and Nurse Practitioners) who all work together to provide you with the care you need, when you need it.  We recommend signing up for the patient portal called "MyChart".  Sign up information is provided on this After Visit Summary.  MyChart is used to connect with patients for Virtual Visits (Telemedicine).  Patients are able to view lab/test results, encounter notes, upcoming appointments, etc.  Non-urgent messages can be sent to your provider as well.   To learn more about what you can do with MyChart, go to [email or web address].    Your next appointment:   6 month(s)  The format for your next appointment:   In Person  Provider:   Evalina Field, MD

## 2022-09-04 ENCOUNTER — Other Ambulatory Visit: Payer: Self-pay

## 2022-09-04 MED ORDER — ATORVASTATIN CALCIUM 40 MG PO TABS: 40.0000 mg | ORAL_TABLET | Freq: Every day | ORAL | 3 refills | Status: AC

## 2022-09-07 ENCOUNTER — Ambulatory Visit (HOSPITAL_COMMUNITY)
Admission: RE | Admit: 2022-09-07 | Discharge: 2022-09-07 | Disposition: A | Discharge status: Home / Self Care | Payer: Medicare Other | Source: Ambulatory Visit | Attending: Registered Nurse | Admitting: Registered Nurse

## 2022-09-07 ENCOUNTER — Other Ambulatory Visit: Payer: Self-pay | Admitting: Registered Nurse

## 2022-09-07 DIAGNOSIS — R110 Nausea: Secondary | ICD-10-CM | POA: Insufficient documentation

## 2022-09-07 DIAGNOSIS — R140 Abdominal distension (gaseous): Secondary | ICD-10-CM | POA: Diagnosis not present

## 2022-09-17 ENCOUNTER — Encounter: Payer: Self-pay | Admitting: Family Medicine

## 2022-09-18 ENCOUNTER — Encounter: Payer: Self-pay | Admitting: Neurology

## 2022-09-22 NOTE — Telephone Encounter (Signed)
Duplicate message. Was also sent to Amy NP who saw her last.

## 2022-10-02 ENCOUNTER — Other Ambulatory Visit: Payer: Self-pay

## 2022-10-02 DIAGNOSIS — N189 Chronic kidney disease, unspecified: Secondary | ICD-10-CM | POA: Insufficient documentation

## 2022-10-02 DIAGNOSIS — Z79899 Other long term (current) drug therapy: Secondary | ICD-10-CM | POA: Diagnosis not present

## 2022-10-02 DIAGNOSIS — F0390 Unspecified dementia without behavioral disturbance: Secondary | ICD-10-CM | POA: Diagnosis not present

## 2022-10-02 DIAGNOSIS — K869 Disease of pancreas, unspecified: Principal | ICD-10-CM | POA: Insufficient documentation

## 2022-10-02 DIAGNOSIS — Z681 Body mass index (BMI) 19 or less, adult: Secondary | ICD-10-CM | POA: Insufficient documentation

## 2022-10-02 DIAGNOSIS — Z7982 Long term (current) use of aspirin: Secondary | ICD-10-CM | POA: Insufficient documentation

## 2022-10-02 DIAGNOSIS — R112 Nausea with vomiting, unspecified: Secondary | ICD-10-CM | POA: Insufficient documentation

## 2022-10-02 DIAGNOSIS — Z7902 Long term (current) use of antithrombotics/antiplatelets: Secondary | ICD-10-CM | POA: Insufficient documentation

## 2022-10-02 DIAGNOSIS — I2510 Atherosclerotic heart disease of native coronary artery without angina pectoris: Secondary | ICD-10-CM | POA: Diagnosis not present

## 2022-10-02 DIAGNOSIS — I5032 Chronic diastolic (congestive) heart failure: Secondary | ICD-10-CM | POA: Insufficient documentation

## 2022-10-02 DIAGNOSIS — E039 Hypothyroidism, unspecified: Secondary | ICD-10-CM | POA: Insufficient documentation

## 2022-10-02 DIAGNOSIS — I130 Hypertensive heart and chronic kidney disease with heart failure and stage 1 through stage 4 chronic kidney disease, or unspecified chronic kidney disease: Secondary | ICD-10-CM | POA: Diagnosis not present

## 2022-10-02 DIAGNOSIS — R634 Abnormal weight loss: Secondary | ICD-10-CM | POA: Diagnosis not present

## 2022-10-02 DIAGNOSIS — D72829 Elevated white blood cell count, unspecified: Secondary | ICD-10-CM | POA: Diagnosis not present

## 2022-10-02 DIAGNOSIS — E43 Unspecified severe protein-calorie malnutrition: Secondary | ICD-10-CM | POA: Diagnosis not present

## 2022-10-02 DIAGNOSIS — Z955 Presence of coronary angioplasty implant and graft: Secondary | ICD-10-CM | POA: Insufficient documentation

## 2022-10-02 DIAGNOSIS — Z87891 Personal history of nicotine dependence: Secondary | ICD-10-CM | POA: Insufficient documentation

## 2022-10-02 DIAGNOSIS — Z794 Long term (current) use of insulin: Secondary | ICD-10-CM | POA: Insufficient documentation

## 2022-10-02 DIAGNOSIS — K5900 Constipation, unspecified: Secondary | ICD-10-CM | POA: Diagnosis present

## 2022-10-02 DIAGNOSIS — Z85828 Personal history of other malignant neoplasm of skin: Secondary | ICD-10-CM | POA: Insufficient documentation

## 2022-10-03 ENCOUNTER — Encounter (HOSPITAL_COMMUNITY): Payer: Self-pay

## 2022-10-03 ENCOUNTER — Inpatient Hospital Stay (HOSPITAL_COMMUNITY): Payer: Medicare Other

## 2022-10-03 ENCOUNTER — Observation Stay (HOSPITAL_BASED_OUTPATIENT_CLINIC_OR_DEPARTMENT_OTHER)
Admission: EM | Admit: 2022-10-03 | Discharge: 2022-10-04 | Disposition: A | Discharge status: Home Health | Payer: Medicare Other | Attending: Internal Medicine | Admitting: Internal Medicine

## 2022-10-03 ENCOUNTER — Encounter (HOSPITAL_BASED_OUTPATIENT_CLINIC_OR_DEPARTMENT_OTHER): Payer: Self-pay

## 2022-10-03 ENCOUNTER — Emergency Department (HOSPITAL_BASED_OUTPATIENT_CLINIC_OR_DEPARTMENT_OTHER): Payer: Medicare Other

## 2022-10-03 DIAGNOSIS — K8689 Other specified diseases of pancreas: Secondary | ICD-10-CM | POA: Diagnosis not present

## 2022-10-03 DIAGNOSIS — K869 Disease of pancreas, unspecified: Secondary | ICD-10-CM | POA: Diagnosis not present

## 2022-10-03 DIAGNOSIS — K2100 Gastro-esophageal reflux disease with esophagitis, without bleeding: Secondary | ICD-10-CM

## 2022-10-03 DIAGNOSIS — D72829 Elevated white blood cell count, unspecified: Secondary | ICD-10-CM

## 2022-10-03 DIAGNOSIS — E782 Mixed hyperlipidemia: Secondary | ICD-10-CM | POA: Diagnosis not present

## 2022-10-03 DIAGNOSIS — K219 Gastro-esophageal reflux disease without esophagitis: Secondary | ICD-10-CM

## 2022-10-03 DIAGNOSIS — E039 Hypothyroidism, unspecified: Secondary | ICD-10-CM

## 2022-10-03 DIAGNOSIS — E119 Type 2 diabetes mellitus without complications: Secondary | ICD-10-CM

## 2022-10-03 DIAGNOSIS — F32A Depression, unspecified: Secondary | ICD-10-CM | POA: Diagnosis not present

## 2022-10-03 DIAGNOSIS — E785 Hyperlipidemia, unspecified: Secondary | ICD-10-CM | POA: Insufficient documentation

## 2022-10-03 DIAGNOSIS — K5900 Constipation, unspecified: Secondary | ICD-10-CM

## 2022-10-03 LAB — COMPREHENSIVE METABOLIC PANEL
ALT: 19 U/L (ref 0–44)
AST: 26 U/L (ref 15–41)
Albumin: 3.9 g/dL (ref 3.5–5.0)
Alkaline Phosphatase: 86 U/L (ref 38–126)
Anion gap: 11 (ref 5–15)
BUN: 21 mg/dL (ref 8–23)
CO2: 26 mmol/L (ref 22–32)
Calcium: 9.9 mg/dL (ref 8.9–10.3)
Chloride: 99 mmol/L (ref 98–111)
Creatinine, Ser: 1.19 mg/dL — ABNORMAL HIGH (ref 0.44–1.00)
GFR, Estimated: 44 mL/min — ABNORMAL LOW (ref >60)
Glucose, Bld: 235 mg/dL — ABNORMAL HIGH (ref 70–99)
Potassium: 3.7 mmol/L (ref 3.5–5.1)
Sodium: 136 mmol/L (ref 135–145)
Total Bilirubin: 0.6 mg/dL (ref 0.3–1.2)
Total Protein: 7.3 g/dL (ref 6.5–8.1)

## 2022-10-03 LAB — CBC
HCT: 39.7 % (ref 36.0–46.0)
Hemoglobin: 13 g/dL (ref 12.0–15.0)
MCH: 32 pg (ref 26.0–34.0)
MCHC: 32.7 g/dL (ref 30.0–36.0)
MCV: 97.8 fL (ref 80.0–100.0)
Platelets: 451 10*3/uL — ABNORMAL HIGH (ref 150–400)
RBC: 4.06 MIL/uL (ref 3.87–5.11)
RDW: 13.7 % (ref 11.5–15.5)
WBC: 18.8 10*3/uL — ABNORMAL HIGH (ref 4.0–10.5)
nRBC: 0 % (ref 0.0–0.2)

## 2022-10-03 LAB — GLUCOSE, CAPILLARY
Glucose-Capillary: 117 mg/dL — ABNORMAL HIGH (ref 70–99)
Glucose-Capillary: 145 mg/dL — ABNORMAL HIGH (ref 70–99)

## 2022-10-03 LAB — LIPASE, BLOOD: Lipase: 84 U/L — ABNORMAL HIGH (ref 11–51)

## 2022-10-03 MED ORDER — SODIUM CHLORIDE 0.9 % IV SOLN: 250.0000 mL | INTRAVENOUS | Status: DC | PRN

## 2022-10-03 MED ORDER — HYDROCODONE-ACETAMINOPHEN 5-325 MG PO TABS: 1.0000 | ORAL_TABLET | ORAL | Status: DC | PRN

## 2022-10-03 MED ORDER — SODIUM CHLORIDE 0.9 % IV SOLN: INTRAVENOUS | Status: AC

## 2022-10-03 MED ORDER — INSULIN ASPART 100 UNIT/ML IJ SOLN: 0.0000 [IU] | Freq: Every day | INTRAMUSCULAR | Status: DC

## 2022-10-03 MED ORDER — DONEPEZIL HCL 10 MG PO TABS
10.0000 mg | ORAL_TABLET | Freq: Every day | ORAL | Status: DC
  Administered 2022-10-03: 10 mg via ORAL
  Filled 2022-10-03: qty 1

## 2022-10-03 MED ORDER — GADOBUTROL 1 MMOL/ML IV SOLN
4.5000 mL | Freq: Once | INTRAVENOUS | Status: AC | PRN
  Administered 2022-10-03: 4.5 mL via INTRAVENOUS

## 2022-10-03 MED ORDER — SODIUM CHLORIDE 0.9% FLUSH: 3.0000 mL | INTRAVENOUS | Status: DC | PRN

## 2022-10-03 MED ORDER — PIPERACILLIN-TAZOBACTAM 3.375 G IVPB
3.3750 g | Freq: Three times a day (TID) | INTRAVENOUS | Status: DC
  Administered 2022-10-03 – 2022-10-04 (×3): 3.375 g via INTRAVENOUS
  Filled 2022-10-03 (×3): qty 50

## 2022-10-03 MED ORDER — MIRABEGRON ER 25 MG PO TB24
25.0000 mg | ORAL_TABLET | Freq: Every day | ORAL | Status: DC
  Filled 2022-10-03 (×2): qty 1

## 2022-10-03 MED ORDER — INSULIN ASPART 100 UNIT/ML IJ SOLN: 0.0000 [IU] | Freq: Three times a day (TID) | INTRAMUSCULAR | Status: DC

## 2022-10-03 MED ORDER — ONDANSETRON HCL 4 MG/2ML IJ SOLN: 4.0000 mg | Freq: Four times a day (QID) | INTRAMUSCULAR | Status: DC | PRN

## 2022-10-03 MED ORDER — SENNOSIDES-DOCUSATE SODIUM 8.6-50 MG PO TABS
1.0000 | ORAL_TABLET | Freq: Two times a day (BID) | ORAL | Status: DC
  Administered 2022-10-04: 1 via ORAL
  Filled 2022-10-03 (×3): qty 1

## 2022-10-03 MED ORDER — INSULIN GLARGINE-YFGN 100 UNIT/ML ~~LOC~~ SOLN
6.0000 [IU] | Freq: Every day | SUBCUTANEOUS | Status: DC
  Administered 2022-10-03: 6 [IU] via SUBCUTANEOUS
  Filled 2022-10-03 (×2): qty 0.06

## 2022-10-03 MED ORDER — SODIUM CHLORIDE 0.9% FLUSH
3.0000 mL | Freq: Two times a day (BID) | INTRAVENOUS | Status: DC
  Administered 2022-10-03 – 2022-10-04 (×2): 3 mL via INTRAVENOUS

## 2022-10-03 MED ORDER — INSULIN ASPART 100 UNIT/ML IJ SOLN
0.0000 [IU] | Freq: Three times a day (TID) | INTRAMUSCULAR | Status: DC
  Administered 2022-10-04: 1 [IU] via SUBCUTANEOUS
  Administered 2022-10-04: 2 [IU] via SUBCUTANEOUS
  Administered 2022-10-04: 1 [IU] via SUBCUTANEOUS

## 2022-10-03 MED ORDER — SODIUM CHLORIDE 0.9 % IV BOLUS
500.0000 mL | Freq: Once | INTRAVENOUS | Status: AC
  Administered 2022-10-03: 500 mL via INTRAVENOUS

## 2022-10-03 MED ORDER — LEVOTHYROXINE SODIUM 25 MCG PO TABS
25.0000 ug | ORAL_TABLET | Freq: Every day | ORAL | Status: DC
  Administered 2022-10-04: 25 ug via ORAL
  Filled 2022-10-03: qty 1

## 2022-10-03 MED ORDER — PANTOPRAZOLE SODIUM 40 MG PO TBEC
40.0000 mg | DELAYED_RELEASE_TABLET | Freq: Every day | ORAL | Status: DC
  Administered 2022-10-03: 40 mg via ORAL
  Filled 2022-10-03 (×2): qty 1

## 2022-10-03 MED ORDER — BISACODYL 5 MG PO TBEC: 5.0000 mg | DELAYED_RELEASE_TABLET | Freq: Every day | ORAL | Status: DC | PRN

## 2022-10-03 MED ORDER — ACETAMINOPHEN 500 MG PO TABS: 1000.0000 mg | ORAL_TABLET | Freq: Three times a day (TID) | ORAL | Status: DC | PRN

## 2022-10-03 MED ORDER — ENOXAPARIN SODIUM 30 MG/0.3ML IJ SOSY
30.0000 mg | PREFILLED_SYRINGE | INTRAMUSCULAR | Status: DC
  Administered 2022-10-03: 30 mg via SUBCUTANEOUS
  Filled 2022-10-03: qty 0.3

## 2022-10-03 MED ORDER — SODIUM CHLORIDE 0.9 % IV SOLN: INTRAVENOUS | Status: DC | PRN

## 2022-10-03 MED ORDER — ONDANSETRON HCL 4 MG/2ML IJ SOLN
4.0000 mg | Freq: Once | INTRAMUSCULAR | Status: AC | PRN
  Administered 2022-10-03: 4 mg via INTRAVENOUS
  Filled 2022-10-03: qty 2

## 2022-10-03 MED ORDER — IOHEXOL 300 MG/ML  SOLN
80.0000 mL | Freq: Once | INTRAMUSCULAR | Status: AC | PRN
  Administered 2022-10-03: 80 mL via INTRAVENOUS

## 2022-10-03 MED ORDER — ACETAMINOPHEN 500 MG PO TABS
500.0000 mg | ORAL_TABLET | Freq: Four times a day (QID) | ORAL | Status: DC | PRN
  Administered 2022-10-03: 500 mg via ORAL
  Filled 2022-10-03: qty 1

## 2022-10-03 MED ORDER — SERTRALINE HCL 100 MG PO TABS
100.0000 mg | ORAL_TABLET | Freq: Every day | ORAL | Status: DC
  Administered 2022-10-03: 100 mg via ORAL
  Filled 2022-10-03: qty 1

## 2022-10-03 MED ORDER — PIPERACILLIN-TAZOBACTAM 3.375 G IVPB 30 MIN
3.3750 g | Freq: Once | INTRAVENOUS | Status: AC
  Administered 2022-10-03: 3.375 g via INTRAVENOUS
  Filled 2022-10-03: qty 50

## 2022-10-03 MED ORDER — POLYETHYLENE GLYCOL 3350 17 G PO PACK
17.0000 g | PACK | Freq: Two times a day (BID) | ORAL | Status: DC
  Administered 2022-10-04: 17 g via ORAL
  Filled 2022-10-03 (×3): qty 1

## 2022-10-03 MED ORDER — INSULIN DEGLUDEC 100 UNIT/ML ~~LOC~~ SOPN: 6.0000 [IU] | PEN_INJECTOR | Freq: Every day | SUBCUTANEOUS | Status: DC

## 2022-10-03 NOTE — ED Provider Notes (Signed)
[facility] EMERGENCY DEPARTMENT Provider Note   CSN: [id number] Arrival date & time: 10/02/22  2356     History  Chief Complaint  Patient presents with   Constipation   Emesis    [name] is a 89 y.o. female.  The history is provided by the patient, a relative and medical records.  Constipation Associated symptoms: vomiting   Emesis [name] is a 89 y.o. female who presents to the Emergency Department complaining of constipation.  She presents to the emergency department accompanied by her daughter for evaluation of abdominal pain and vomiting.  She has been experiencing severe constipation off and on since July.  [facility] with her needs.  She lives with her daughter and has been receiving MiraLAX twice daily, Colace twice daily for constipation.  Last BM was 9 days ago.  Today she had 2 additional Ex-Lax as well as 5 ounces of magnesium citrate and a glycerin suppository.  This evening she developed nausea, vomiting and diaphoresis and daughter called 911.  She has been complaining of increased and severe pain for the last several days.  No fever.  No prior abdominal surgeries.  Has a history of CHF, diabetes, CKD.     Home Medications Prior to Admission medications   Medication Sig Start Date End Date Taking? Authorizing Provider  acetaminophen (TYLENOL) 500 MG tablet Take 500 mg by mouth every 6 (six) hours as needed for moderate pain.    [provider]  aspirin EC 81 MG tablet Take 1 tablet (81 mg total) by mouth daily. Swallow whole. 09/23/21   Wellington Hampshire, MD  atorvastatin (LIPITOR) 40 MG tablet Take 1 tablet (40 mg total) by mouth daily. 09/04/22 08/30/23  O'NealCassie Freer, MD  Biotin 1000 MCG tablet Take 1,000 mcg by mouth daily.    [provider]  Cholecalciferol (VITAMIN D3 SUPER STRENGTH) 50 MCG (2000 UT) TABS Take 2,000 Units by mouth daily.    [provider]   clopidogrel (PLAVIX) 75 MG tablet Take 75 mg by mouth every Monday, Wednesday, and Friday.     [provider]  donepezil (ARICEPT) 10 MG tablet TAKE 1 TABLET BY MOUTH AT  BEDTIME 08/24/22   Lomax, Amy, NP  furosemide (LASIX) 20 MG tablet Take 1 tablet (20 mg total) by mouth daily as needed (weight gain 2-3 lbs.). Patient taking differently: Take 20 mg by mouth See admin instructions. Take 20 mg every other day as needed for weight gain 2-3 lbs. 12/06/20   O'Neal, Cassie Freer, MD  levothyroxine (SYNTHROID) 25 MCG tablet Take 25 mcg by mouth daily before breakfast.    [provider]  LYUMJEV KWIKPEN 100 UNIT/ML KwikPen Inject 2 Units into the skin daily. 03/03/22   [provider]  sertraline (ZOLOFT) 100 MG tablet Take 100 mg by mouth at bedtime.    [provider]  TRESIBA FLEXTOUCH 100 UNIT/ML FlexTouch Pen Inject 6 Units into the skin at bedtime. 04/20/22   [provider]  Vibegron (GEMTESA) 75 MG TABS Take 75 mg by mouth daily.    [provider]      Allergies    Tape, Lisinopril, Morphine and related, Nitrofuran derivatives, and Other    Review of Systems   Review of Systems  Gastrointestinal:  Positive for constipation and vomiting.  All other systems reviewed and are negative.   Physical Exam Updated Vital Signs BP 138/68   Pulse 81  Temp (!) 97.5 F (36.4 C)   Resp 18   Ht '5\' 7"'$  (1.702 m)   Wt 45.4 kg   SpO2 95%   BMI 15.66 kg/m  Physical Exam Vitals and nursing note reviewed.  Constitutional:      Appearance: She is well-developed.  HENT:     Head: Normocephalic and atraumatic.  Cardiovascular:     Rate and Rhythm: Normal rate and regular rhythm.  Pulmonary:     Effort: Pulmonary effort is normal. No respiratory distress.  Abdominal:     Palpations: Abdomen is soft.     Tenderness: There is no guarding or rebound.     Comments: Mild generalized abdominal tenderness  Musculoskeletal:        General: No  swelling or tenderness.  Skin:    General: Skin is warm and dry.  Neurological:     Mental Status: She is alert.     Comments: Awake and alert  Psychiatric:        Behavior: Behavior normal.     ED Results / Procedures / Treatments   Labs (all labs ordered are listed, but only abnormal results are displayed) Labs Reviewed  LIPASE, BLOOD - Abnormal; Notable for the following components:      Result Value   Lipase 84 (*)    All other components within normal limits  COMPREHENSIVE METABOLIC PANEL - Abnormal; Notable for the following components:   Glucose, Bld 235 (*)    Creatinine, Ser 1.19 (*)    GFR, Estimated 44 (*)    All other components within normal limits  CBC - Abnormal; Notable for the following components:   WBC 18.8 (*)    Platelets 451 (*)    All other components within normal limits  URINALYSIS, ROUTINE W REFLEX MICROSCOPIC    EKG None  Radiology CT Abdomen Pelvis W Contrast  Result Date: 10/03/2022 CLINICAL DATA:  Abdominal pain, acute, nonlocalized EXAM: CT ABDOMEN AND PELVIS WITH CONTRAST TECHNIQUE: Multidetector CT imaging of the abdomen and pelvis was performed using the standard protocol following bolus administration of intravenous contrast. RADIATION DOSE REDUCTION: This exam was performed according to the departmental dose-optimization program which includes automated exposure control, adjustment of the mA and/or kV according to patient size and/or use of iterative reconstruction technique. CONTRAST:  11m OMNIPAQUE IOHEXOL 300 MG/ML  SOLN COMPARISON:  Ultrasound abdomen 09/07/2022 FINDINGS: Lower chest: No acute abnormality. Small hiatal hernia. Atherosclerotic plaque. Hepatobiliary: No focal liver abnormality. No gallstones, gallbladder wall thickening, or pericholecystic fluid. No biliary dilatation. Pancreas: Ill-defined 4.6 x 3 cm hypodense mass centered within the pancreatic body with encasement of the proximal celiac artery and its branches as well as  the proximal superior mesenteric artery. Finding abuts and is inseparable from the lesser curvature of the stomach (6:30). Normal pancreatic contour. No surrounding inflammatory changes. No main pancreatic ductal dilatation. Spleen: Multiple subcentimeter hypodensities-indeterminate. Otherwise normal in size. Adrenals/Urinary Tract: No adrenal nodule bilaterally. Bilateral kidneys enhance symmetrically. Subcentimeter hypodensities are too small to characterize-no further follow-up indicated. No hydronephrosis. No hydroureter. The urinary bladder is unremarkable. Stomach/Bowel: Stomach is within normal limits. No evidence of bowel wall thickening or dilatation. Stool throughout the colon. Mild bowel wall thickening and fat stranding of the descending colon. Appendix appears normal. Vascular/Lymphatic: No abdominal aorta or iliac aneurysm. Severe atherosclerotic plaque of the aorta and its branches. No abdominal, pelvic, or inguinal lymphadenopathy. Reproductive: Haziness of the small bowel mesentery. Status post hysterectomy. No right adnexal mass. A 1.9 x 1.4 cm  soft tissue density (2:53) along the left pelvic sidewall may represent an ovary. Other: No intraperitoneal free fluid. No intraperitoneal free gas. No organized fluid collection. Musculoskeletal: No abdominal wall hernia or abnormality. No suspicious lytic or blastic osseous lesions. No acute displaced fracture. Dextroscoliosis of the thoracolumbar spine with associated severe multilevel degenerative changes of the spine. Total left hip arthroplasty partially visualized. IMPRESSION: 1. Ill-defined 4.6 x 3 cm mass centered within the pancreatic body with encasement of the proximal celiac artery and its branches as well as the proximal superior mesenteric artery. Mass abuts and is inseparable from the lesser curvature of the stomach. Findings suggestive of malignancy. Recommend MRI pancreatic protocol for further evaluation. 2. A 1.9 x 1.4 cm soft tissue  density along the left pelvic sidewall may represent an ovary. Recommend attention on follow-up. 3. Misty mesentery of the small bowel mesentery. 4. Multiple indeterminate subcentimeter splenic attention on follow-up. 5. Constipation with developing stercoral colitis versus descending colitis. 6. Small hiatal hernia. 7.  Aortic Atherosclerosis (ICD10-I70.0). Electronically Signed   By: Iven Finn M.D.   On: 10/03/2022 02:54    Procedures Procedures    Medications Ordered in ED Medications  0.9 %  sodium chloride infusion (has no administration in time range)  ondansetron (ZOFRAN) injection 4 mg (4 mg Intravenous Given 10/03/22 0016)  sodium chloride 0.9 % bolus 500 mL ( Intravenous Stopped 10/03/22 0141)  iohexol (OMNIPAQUE) 300 MG/ML solution 80 mL (80 mLs Intravenous Contrast Given 10/03/22 0222)  piperacillin-tazobactam (ZOSYN) IVPB 3.375 g (0 g Intravenous Stopped 10/03/22 0735)    ED Course/ Medical Decision Making/ A&P                             Medical Decision Making Amount and/or Complexity of Data Reviewed Labs: ordered. Radiology: ordered.  Risk Prescription drug management.   Patient here for evaluation of abdominal pain, vomiting, constipation.  Patient with mild tenderness on examination without peritoneal findings.  Labs significant for leukocytosis.  She does have some stool in her rectal vault, unable to disimpact.  A CT abdomen pelvis was obtained, which demonstrates a large amount of constipation with concern for stercoral colitis.  CT scan also demonstrates pancreatic mass.  Discussed with patient and daughter findings of studies.  Will start on antibiotics for possible infection.  No clinical evidence of perforation.  She did receive an enema in the emergency department and did have a BM-unclear how large this was.  On repeat rectal examination no fecal impaction.  Given her CT imaging findings, leukocytosis, abdominal pain recommend observation admission.  Medicine  consulted for admission for ongoing care.        Final Clinical Impression(s) / ED Diagnoses Final diagnoses:  Pancreatic mass  Constipation, unspecified constipation type  Leukocytosis, unspecified type    Rx / DC Orders ED Discharge Orders     None         Quintella Reichert, MD 10/03/22 [phone]

## 2022-10-03 NOTE — ED Notes (Signed)
Pt had small smear of stool noted in brief  Pt assisted to [facility]  Family at bedside  Will call when pt is done

## 2022-10-03 NOTE — ED Triage Notes (Signed)
Patient arrives vis [facility] from home. Patient complains of constipation for 9 days and vomiting for a few hours. Daughter in waiting room.

## 2022-10-03 NOTE — ED Notes (Signed)
ED Provider at bedside. 

## 2022-10-03 NOTE — ED Notes (Signed)
Soap suds enema given  Pt unable to retain much of the fluid  Pt placed in a brief  Pt tolerated well Family at bedside

## 2022-10-03 NOTE — Progress Notes (Signed)
Pharmacy Antibiotic Note  [name] is a 89 y.o. female admitted on 10/03/2022 with sterocolitis.  Pharmacy has been consulted for Zosyn dosing.  Plan: Zosyn 3.375gm IV q8h (4hr extended infusions) Monitor renal function closely and adjust dose as needed  Height: '5\' 7"'$  (170.2 cm) Weight: 45.4 kg (100 lb) IBW/kg (Calculated) : 61.6  Temp (24hrs), Avg:97.9 F (36.6 C), Min:97.4 F (36.3 C), Max:98.2 F (36.8 C)  Recent Labs  Lab 10/03/22 0013  WBC 18.8*  CREATININE 1.19*    Estimated Creatinine Clearance: 23.4 mL/min (A) (by C-G formula based on SCr of 1.19 mg/dL (H)).    Allergies  Allergen Reactions   Tape Other (See Comments)    SKIN IS VERY THIN AND TEARS EXTREMELY EASILY!!   Lisinopril Cough   Morphine And Related     Narcotic pain meds/Morphine exacerbate dementia symptoms (combative)   Nitrofuran Derivatives Nausea And Vomiting, Other (See Comments) and Cough    Dizziness, also   Other     Narcotic pain meds exacerbate dementia symptoms (combative)    Antimicrobials this admission: 1/13 Zosyn >>  Dose adjustments this admission:  Microbiology results: none  Thank you for allowing pharmacy to be a part of this patient's care.  Peggyann Juba, PharmD, BCPS Pharmacy: [phone] 10/03/2022 1:55 PM

## 2022-10-03 NOTE — H&P (Signed)
[facility] History and Physical  [name] CBS:[id number] DOB: Sep 19, 1932 DOA: 10/03/2022  Referring physician: ED  PCP: Michael Boston, MD   Patient is coming from: Home  Chief Complaint: Constipation and vomiting  HPI: Patient is 89 years old female with history of coronary artery disease, dementia, chronic diastolic heart failure, diabetes mellitus, hypertension, hyperlipidemia and hypothyroidism presented to hospital from home with complaints of constipation and nausea for the last 9 days prior to presentation.  She also had abdominal pain.  Of note she had been experiencing constipation on and off since July.  She was on outpatient regimen of MiraLAX and Colace for constipation.  She complained of last bowel movement 9 days prior to presentation and in the ED enema was given with some relief.  Daughter at bedside reported that patient has had significant weight loss over the last few weeks and has not been eating well.  He has been complaining of a lot of nausea and had vomiting few times a yesterday.Denied any fever or chills.  Denies any urinary urgency, frequency or dysuria.  In the ED, CT scan of the abdomen was done which showed the pancreatic mass with a lipase at 89.  CT also showed stercoral colitis.  Patient did have some large bowel movement with some relief and due to leukocytosis was given Zosyn.    Assessment and Plan  Constipation, multiple episodes of vomiting, significant weight loss, poor oral intake nausea.  Had a leukocytosis.  Intermittent constipation going on for a while but pain got worse.  Continue aggressive bowel regimen for stercoral colitis.  Continue Zosyn.  Pain better after bowel movement.  Will start the patient on clears.  Pancreatic mass.  CT scan of the abdomen with contrast showed ill-defined 4.6 x 3 cm hypodense mass in the pancreatic body with encasement of the proximal celiac artery and its branches likely suggestive of malignancy.   Recommended MRI pancreatic protocol.  Will order for MRI.  Additional 1.9 x 1.4 cm soft tissue density on the left pelvic sidewall.  Was NPO.  Will start on clears.  History of dementia Continue supportive care.  History of CAD Patient is on aspirin Lipitor and Plavix at home.  Will hold for now.  History of chronic diastolic heart failure Hold aspirin Plavix and Lipitor for now.  Was on Lasix daily at home.  Currently compensated so will hold.  Diabetes mellitus type 2 On insulin regimen with Tresiba at home.  Will add a sliding scale insulin and long-acting while in the hospital.  Hypertension Will monitor.  Not on medications at home.  Hyperlipidemia On Lipitor at home.  Will hold for now  Hypothyroidism Continue Synthroid.  Check TSH  DVT Prophylaxis: Heparin subcu  Review of Systems:  All systems were reviewed and were negative unless otherwise mentioned in the HPI   Past Medical History:  Diagnosis Date   Arthritis    Cancer ([place])    hx of skin cancer   CHF (congestive heart failure) ([place])    Complication of anesthesia    no narcotics per daughter   Coronary artery disease    Depression    DM (diabetes mellitus) ([place])    Essential hypertension 08/11/2020   GERD (gastroesophageal reflux disease)    Hyperlipidemia    Hypothyroidism    Myocardial infarct [facility]) 2012   Renal disorder    Past Surgical History:  Procedure Laterality Date   ABDOMINAL AORTOGRAM W/LOWER EXTREMITY N/A 10/15/2021   Procedure: ABDOMINAL AORTOGRAM  W/LOWER EXTREMITY;  Surgeon: Wellington Hampshire, MD;  Location: [facility];  Service: Cardiovascular;  Laterality: N/A;   BOTOX INJECTION N/A 06/02/2022   Procedure: BOTOX INJECTION 75 UNITS;  Surgeon: Robley Fries, MD;  Location: [facility];  Service: Urology;  Laterality: N/A;   CARDIAC CATHETERIZATION     CAROTID ENDARTERECTOMY     CORONARY ANGIOPLASTY     CORONARY STENT PLACEMENT     CYSTOSCOPY N/A 06/02/2022   Procedure:  CYSTOSCOPY AND BULKAMID;  Surgeon: Robley Fries, MD;  Location: [facility];  Service: Urology;  Laterality: N/A;  45 MINS   IR THORACENTESIS ASP PLEURAL SPACE W/IMG GUIDE  08/13/2020   PERIPHERAL VASCULAR BALLOON ANGIOPLASTY  10/15/2021   Procedure: PERIPHERAL VASCULAR BALLOON ANGIOPLASTY;  Surgeon: Wellington Hampshire, MD;  Location: [facility];  Service: Cardiovascular;;   TOTAL VAGINAL HYSTERECTOMY      Social History:  reports that she quit smoking about 52 years ago. Her smoking use included cigarettes. She has a 2.00 pack-year smoking history. She has never used smokeless tobacco. She reports that she does not drink alcohol and does not use drugs.  Allergies  Allergen Reactions   Tape Other (See Comments)    SKIN IS VERY THIN AND TEARS EXTREMELY EASILY!!   Lisinopril Cough   Morphine And Related     Narcotic pain meds/Morphine exacerbate dementia symptoms (combative)   Nitrofuran Derivatives Nausea And Vomiting and Other (See Comments)    Dizziness, also   Other     Narcotic pain meds exacerbate dementia symptoms (combative)    Family History  Problem Relation Age of Onset   Alzheimer's disease Mother    Heart attack Father    Heart disease Father    Lymphoma Sister    Heart failure Brother      Prior to Admission medications   Medication Sig Start Date End Date Taking? Authorizing Provider  acetaminophen (TYLENOL) 500 MG tablet Take 500 mg by mouth every 6 (six) hours as needed for moderate pain.    [provider]  aspirin EC 81 MG tablet Take 1 tablet (81 mg total) by mouth daily. Swallow whole. 09/23/21   Wellington Hampshire, MD  atorvastatin (LIPITOR) 40 MG tablet Take 1 tablet (40 mg total) by mouth daily. 09/04/22 08/30/23  O'NealCassie Freer, MD  Biotin 1000 MCG tablet Take 1,000 mcg by mouth daily.    [provider]  Cholecalciferol (VITAMIN D3 SUPER STRENGTH) 50 MCG (2000 UT) TABS Take 2,000 Units by mouth daily.    [provider]   clopidogrel (PLAVIX) 75 MG tablet Take 75 mg by mouth every Monday, Wednesday, and Friday.     [provider]  donepezil (ARICEPT) 10 MG tablet TAKE 1 TABLET BY MOUTH AT  BEDTIME 08/24/22   Lomax, Amy, NP  furosemide (LASIX) 20 MG tablet Take 1 tablet (20 mg total) by mouth daily as needed (weight gain 2-3 lbs.). Patient taking differently: Take 20 mg by mouth See admin instructions. Take 20 mg every other day as needed for weight gain 2-3 lbs. 12/06/20   O'Neal, Cassie Freer, MD  levothyroxine (SYNTHROID) 25 MCG tablet Take 25 mcg by mouth daily before breakfast.    [provider]  LYUMJEV KWIKPEN 100 UNIT/ML KwikPen Inject 2 Units into the skin daily. 03/03/22   [provider]  sertraline (ZOLOFT) 100 MG tablet Take 100 mg by mouth at bedtime.    [provider]  TRESIBA FLEXTOUCH 100  UNIT/ML FlexTouch Pen Inject 6 Units into the skin at bedtime. 04/20/22   [provider]  Vibegron (GEMTESA) 75 MG TABS Take 75 mg by mouth daily.    [provider]    Physical Exam: Vitals:   10/03/22 0415 10/03/22 0500 10/03/22 0830 10/03/22 0922  BP: 130/67 138/68  126/66  Pulse: 81 81  75  Resp: 18 18    Temp: (!) 97.5 F (36.4 C)  98.1 F (36.7 C) 98.1 F (36.7 C)  TempSrc:   Oral Oral  SpO2: 95% 95%  100%  Weight:      Height:       Wt Readings from Last 3 Encounters:  10/03/22 45.4 kg  09/03/22 47.3 kg  08/11/22 49.9 kg   Body mass index is 15.66 kg/m.  General: Cachectic, frail-appearing elderly female not in obvious distress alert awake and Communicative,  HENT: Normocephalic, No scleral pallor or icterus noted. Oral mucosa is moist.  Chest:  Clear breath sounds.  . No crackles or wheezes.  CVS: S1 &S2 heard. No murmur.  Regular rate and rhythm. Abdomen: Soft, nontender, nondistended.  Bowel sounds are heard.  Palpation of the time of my evaluation. Extremities: No cyanosis, clubbing or edema.  Peripheral pulses are  palpable. Psych: Alert, awake and Communicative, ill-appearing, normal mood CNS:  No cranial nerve deficits.  Generalized weakness noted,thin extremities, Skin: Warm and dry.  No rashes noted.  Labs on Admission:   CBC: Recent Labs  Lab 10/03/22 0013  WBC 18.8*  HGB 13.0  HCT 39.7  MCV 97.8  PLT 451*    Basic Metabolic Panel: Recent Labs  Lab 10/03/22 0013  NA 136  K 3.7  CL 99  CO2 26  GLUCOSE 235*  BUN 21  CREATININE 1.19*  CALCIUM 9.9    Liver Function Tests: Recent Labs  Lab 10/03/22 0013  AST 26  ALT 19  ALKPHOS 86  BILITOT 0.6  PROT 7.3  ALBUMIN 3.9   Recent Labs  Lab 10/03/22 0013  LIPASE 84*   No results for input(s): "AMMONIA" in the last 168 hours.  Cardiac Enzymes: No results for input(s): "CKTOTAL", "CKMB", "CKMBINDEX", "TROPONINI" in the last 168 hours.  BNP (last 3 results) No results for input(s): "BNP" in the last 8760 hours.  ProBNP (last 3 results) No results for input(s): "PROBNP" in the last 8760 hours.  CBG: No results for input(s): "GLUCAP" in the last 168 hours.  Lipase     Component Value Date/Time   LIPASE 84 (H) 10/03/2022 0013     Urinalysis    Component Value Date/Time   COLORURINE YELLOW 08/13/2020 0536   APPEARANCEUR CLOUDY (A) 08/13/2020 0536   LABSPEC 1.020 08/13/2020 0536   PHURINE 5.0 08/13/2020 0536   GLUCOSEU 50 (A) 08/13/2020 0536   HGBUR MODERATE (A) 08/13/2020 0536   BILIRUBINUR NEGATIVE 08/13/2020 0536   KETONESUR NEGATIVE 08/13/2020 0536   PROTEINUR 100 (A) 08/13/2020 0536   NITRITE NEGATIVE 08/13/2020 0536   LEUKOCYTESUR NEGATIVE 08/13/2020 0536     Drugs of Abuse  No results found for: "LABOPIA", "COCAINSCRNUR", "LABBENZ", "AMPHETMU", "THCU", "LABBARB"    Radiological Exams on Admission: CT Abdomen Pelvis W Contrast  Result Date: 10/03/2022 CLINICAL DATA:  Abdominal pain, acute, nonlocalized EXAM: CT ABDOMEN AND PELVIS WITH CONTRAST TECHNIQUE: Multidetector CT imaging of the abdomen  and pelvis was performed using the standard protocol following bolus administration of intravenous contrast. RADIATION DOSE REDUCTION: This exam was performed according to the departmental dose-optimization program which  includes automated exposure control, adjustment of the mA and/or kV according to patient size and/or use of iterative reconstruction technique. CONTRAST:  36m OMNIPAQUE IOHEXOL 300 MG/ML  SOLN COMPARISON:  Ultrasound abdomen 09/07/2022 FINDINGS: Lower chest: No acute abnormality. Small hiatal hernia. Atherosclerotic plaque. Hepatobiliary: No focal liver abnormality. No gallstones, gallbladder wall thickening, or pericholecystic fluid. No biliary dilatation. Pancreas: Ill-defined 4.6 x 3 cm hypodense mass centered within the pancreatic body with encasement of the proximal celiac artery and its branches as well as the proximal superior mesenteric artery. Finding abuts and is inseparable from the lesser curvature of the stomach (6:30). Normal pancreatic contour. No surrounding inflammatory changes. No main pancreatic ductal dilatation. Spleen: Multiple subcentimeter hypodensities-indeterminate. Otherwise normal in size. Adrenals/Urinary Tract: No adrenal nodule bilaterally. Bilateral kidneys enhance symmetrically. Subcentimeter hypodensities are too small to characterize-no further follow-up indicated. No hydronephrosis. No hydroureter. The urinary bladder is unremarkable. Stomach/Bowel: Stomach is within normal limits. No evidence of bowel wall thickening or dilatation. Stool throughout the colon. Mild bowel wall thickening and fat stranding of the descending colon. Appendix appears normal. Vascular/Lymphatic: No abdominal aorta or iliac aneurysm. Severe atherosclerotic plaque of the aorta and its branches. No abdominal, pelvic, or inguinal lymphadenopathy. Reproductive: Haziness of the small bowel mesentery. Status post hysterectomy. No right adnexal mass. A 1.9 x 1.4 cm soft tissue density (2:53)  along the left pelvic sidewall may represent an ovary. Other: No intraperitoneal free fluid. No intraperitoneal free gas. No organized fluid collection. Musculoskeletal: No abdominal wall hernia or abnormality. No suspicious lytic or blastic osseous lesions. No acute displaced fracture. Dextroscoliosis of the thoracolumbar spine with associated severe multilevel degenerative changes of the spine. Total left hip arthroplasty partially visualized. IMPRESSION: 1. Ill-defined 4.6 x 3 cm mass centered within the pancreatic body with encasement of the proximal celiac artery and its branches as well as the proximal superior mesenteric artery. Mass abuts and is inseparable from the lesser curvature of the stomach. Findings suggestive of malignancy. Recommend MRI pancreatic protocol for further evaluation. 2. A 1.9 x 1.4 cm soft tissue density along the left pelvic sidewall may represent an ovary. Recommend attention on follow-up. 3. Misty mesentery of the small bowel mesentery. 4. Multiple indeterminate subcentimeter splenic attention on follow-up. 5. Constipation with developing stercoral colitis versus descending colitis. 6. Small hiatal hernia. 7.  Aortic Atherosclerosis (ICD10-I70.0). Electronically Signed   By: MIven FinnM.D.   On: 10/03/2022 02:54    EKG: Not available for review   Consultant: None  Code Status: Full code  Microbiology none  Antibiotics: None  Family Communication:  Patients' condition and plan of care including tests being ordered have been discussed with the patient and patient's daughter at bedside who indicate understanding and agree with the plan.   Status is: Inpatient Remains inpatient appropriate because: Constipation, significant weight loss, pancreatic mass, need for further workup.   Severity of Illness: The appropriate patient status for this patient is INPATIENT. Inpatient status is judged to be reasonable and necessary in order to provide the required intensity  of service to ensure the patient's safety. The patient's presenting symptoms, physical exam findings, and initial radiographic and laboratory data in the context of their chronic comorbidities is felt to place them at high risk for further clinical deterioration. Furthermore, it is not anticipated that the patient will be medically stable for discharge from the hospital within 2 midnights of admission.   I certify that at the point of admission it is my clinical judgment that the  patient will require inpatient hospital care spanning beyond 2 midnights from the point of admission due to high intensity of service, high risk for further deterioration and high frequency of surveillance required.  Signed, Flora Lipps, MD [facility] 10/03/2022

## 2022-10-03 NOTE — Progress Notes (Signed)
Plan of Care Note for accepted transfer   Patient: [name] MRN: [id number]   [place]: 10/03/2022  Facility requesting transfer: [facility] Requesting Provider: Ralene Bathe Reason for transfer: Pancreatic mass  Facility course: Patient with h/o CAD, dementia, chronic diastolic CHF, DM, HTN, HLD, and hypothyroidism presenting with constipation and emesis.   Symptoms x 9 days.  CT with pancreatic mass, lipase 89.  Also stercoral colitis.  Had large BM with some relief.  Some leukocytosis, given Zosyn.     Plan of care: The patient is accepted for admission to [facility]  unit, at [facility].   Author: Karmen Bongo, MD 10/03/2022  Check [email or web address] for on-call coverage.  Nursing staff, Please call [place] number on Amion as soon as patient's arrival, so appropriate admitting provider can evaluate the pt.

## 2022-10-03 NOTE — ED Notes (Signed)
[facility] at bedside 

## 2022-10-03 NOTE — ED Notes (Signed)
Pt had good results from SSE  Large amount of liquid stool with moderate amount of soft formed stool noted in bedside commode  Pt assisted back to bed  Brief applied  Pt states she feels much better  Family at bedside

## 2022-10-04 DIAGNOSIS — K2100 Gastro-esophageal reflux disease with esophagitis, without bleeding: Secondary | ICD-10-CM | POA: Diagnosis not present

## 2022-10-04 DIAGNOSIS — Z7189 Other specified counseling: Secondary | ICD-10-CM | POA: Diagnosis not present

## 2022-10-04 DIAGNOSIS — K869 Disease of pancreas, unspecified: Secondary | ICD-10-CM | POA: Diagnosis not present

## 2022-10-04 DIAGNOSIS — Z515 Encounter for palliative care: Secondary | ICD-10-CM | POA: Diagnosis not present

## 2022-10-04 DIAGNOSIS — F32A Depression, unspecified: Secondary | ICD-10-CM | POA: Diagnosis not present

## 2022-10-04 DIAGNOSIS — K5900 Constipation, unspecified: Secondary | ICD-10-CM

## 2022-10-04 DIAGNOSIS — K8689 Other specified diseases of pancreas: Secondary | ICD-10-CM | POA: Diagnosis not present

## 2022-10-04 DIAGNOSIS — E782 Mixed hyperlipidemia: Secondary | ICD-10-CM | POA: Diagnosis not present

## 2022-10-04 LAB — COMPREHENSIVE METABOLIC PANEL
ALT: 15 U/L (ref 0–44)
AST: 20 U/L (ref 15–41)
Albumin: 3.1 g/dL — ABNORMAL LOW (ref 3.5–5.0)
Alkaline Phosphatase: 58 U/L (ref 38–126)
Anion gap: 7 (ref 5–15)
BUN: 15 mg/dL (ref 8–23)
CO2: 26 mmol/L (ref 22–32)
Calcium: 8.3 mg/dL — ABNORMAL LOW (ref 8.9–10.3)
Chloride: 103 mmol/L (ref 98–111)
Creatinine, Ser: 1.01 mg/dL — ABNORMAL HIGH (ref 0.44–1.00)
GFR, Estimated: 54 mL/min — ABNORMAL LOW (ref >60)
Glucose, Bld: 125 mg/dL — ABNORMAL HIGH (ref 70–99)
Potassium: 4 mmol/L (ref 3.5–5.1)
Sodium: 136 mmol/L (ref 135–145)
Total Bilirubin: 0.5 mg/dL (ref 0.3–1.2)
Total Protein: 5.6 g/dL — ABNORMAL LOW (ref 6.5–8.1)

## 2022-10-04 LAB — CBC
HCT: 31.7 % — ABNORMAL LOW (ref 36.0–46.0)
Hemoglobin: 10.1 g/dL — ABNORMAL LOW (ref 12.0–15.0)
MCH: 32 pg (ref 26.0–34.0)
MCHC: 31.9 g/dL (ref 30.0–36.0)
MCV: 100.3 fL — ABNORMAL HIGH (ref 80.0–100.0)
Platelets: 253 10*3/uL (ref 150–400)
RBC: 3.16 MIL/uL — ABNORMAL LOW (ref 3.87–5.11)
RDW: 13.7 % (ref 11.5–15.5)
WBC: 8.6 10*3/uL (ref 4.0–10.5)
nRBC: 0 % (ref 0.0–0.2)

## 2022-10-04 LAB — GLUCOSE, CAPILLARY
Glucose-Capillary: 123 mg/dL — ABNORMAL HIGH (ref 70–99)
Glucose-Capillary: 131 mg/dL — ABNORMAL HIGH (ref 70–99)
Glucose-Capillary: 152 mg/dL — ABNORMAL HIGH (ref 70–99)

## 2022-10-04 LAB — TSH: TSH: 0.782 u[IU]/mL (ref 0.350–4.500)

## 2022-10-04 LAB — MAGNESIUM: Magnesium: 2.3 mg/dL (ref 1.7–2.4)

## 2022-10-04 MED ORDER — PANTOPRAZOLE SODIUM 40 MG PO TBEC: 40.0000 mg | DELAYED_RELEASE_TABLET | Freq: Every day | ORAL | 2 refills | Status: AC

## 2022-10-04 MED ORDER — HYDROCODONE-ACETAMINOPHEN 5-325 MG PO TABS: 1.0000 | ORAL_TABLET | Freq: Four times a day (QID) | ORAL | 0 refills | Status: AC | PRN

## 2022-10-04 MED ORDER — SENNOSIDES-DOCUSATE SODIUM 8.6-50 MG PO TABS: 1.0000 | ORAL_TABLET | Freq: Two times a day (BID) | ORAL | 2 refills | Status: AC

## 2022-10-04 MED ORDER — POLYETHYLENE GLYCOL 3350 17 G PO PACK: 17.0000 g | PACK | Freq: Two times a day (BID) | ORAL | 0 refills | Status: AC

## 2022-10-04 MED ORDER — BISACODYL 10 MG RE SUPP: 10.0000 mg | RECTAL | 0 refills | Status: AC

## 2022-10-04 NOTE — Progress Notes (Signed)
Assessment unchanged. Pt and daughters verbalized understanding of dc instructions including medications and follow up care. Palliative to follow pt at home. Discharged via wc to front entrance escorted by NT and daughters.

## 2022-10-04 NOTE — TOC Initial Note (Addendum)
Transition of Care [facility]) - Initial/Assessment Note    Patient Details  Name: [name] MRN: [id number] Date of Birth: 08-02-1932  Transition of [facility]) CM/SW Contact:    Henrietta Dine, RN Phone Number: 10/04/2022, 4:08 PM  Clinical Narrative:                 Damaris Schooner w/ pt and dtrs Venita Lick in room;  pt is from home and plans to return at d/c; they would like to resume [facility] services w/ Latricia Heft; POC is dtr Jena Gauss [place] [phone]); she has transportation; she denies IPV, food insecurity, or problems paying utilities; pt says she has glasses, RW, WC and cane; pt and family seen by Placido Sou, NP from [facility] and family says she placed a referral w/ [facility]; no TOC needs.   Expected Discharge Plan: [place] Barriers to Discharge: No Barriers Identified   Patient Goals and CMS Choice Patient states their goals for this hospitalization and ongoing recovery are:: home          Expected Discharge Plan and Services   Discharge Planning Services: CM Consult Post Acute Care Choice: Resumption of Svcs/PTA Provider [facility] services w/ [phone]) Living arrangements for the past 2 months: Single Family Home Expected Discharge Date: 10/04/22                                    Prior Living Arrangements/Services Living arrangements for the past 2 months: Single Family Home Lives with:: Self Patient language and need for interpreter reviewed:: Yes Do you feel safe going back to the place where you live?: Yes      Need for Family Participation in Patient Care: Yes (Comment) Care giver support system in place?: Yes (comment) Current home services: DME, Home OT, Home PT, Home RN (HHPT/OT/RN w/ [facility]; RW, WC, Cane) Criminal Activity/Legal Involvement Pertinent to Current Situation/Hospitalization: No - Comment as needed  Activities of Daily Living Home Assistive Devices/Equipment: Bedside commode/3-in-1, CBG Meter, Grab bars in shower,  Cane (specify quad or straight), Eyeglasses, Dentures (specify type), Grab bars around toilet, Shower chair without back, Walker (specify type), Other (Comment) ADL Screening (condition at time of admission) Patient's cognitive ability adequate to safely complete daily activities?: Yes Is the patient deaf or have difficulty hearing?: No Does the patient have difficulty seeing, even when wearing glasses/contacts?: No Does the patient have difficulty concentrating, remembering, or making decisions?: Yes Patient able to express need for assistance with ADLs?: Yes Does the patient have difficulty dressing or bathing?: Yes Independently performs ADLs?: No Communication: Independent Does the patient have difficulty walking or climbing stairs?: Yes Weakness of Legs: Both Weakness of Arms/Hands: None  Permission Sought/Granted Permission sought to share information with : Case Manager Permission granted to share information with : Yes, Verbal Permission Granted  Share Information with NAME: Lenor Coffin, RN, CM           Emotional Assessment Appearance:: Appears stated age Attitude/Demeanor/Rapport: Gracious Affect (typically observed): Accepting Orientation: : Oriented to Self, Oriented to Place, Oriented to  Time, Oriented to Situation Alcohol / Substance Use: Not Applicable Psych Involvement: No (comment)  Admission diagnosis:  Pancreatic mass [K86.89] Constipation, unspecified constipation type [K59.00] Leukocytosis, unspecified type [D72.829] Patient Active Problem List   Diagnosis Date Noted   Pancreatic mass 10/03/2022   Depression 10/03/2022   GERD (gastroesophageal reflux disease) 10/03/2022   Hypothyroidism  10/03/2022   CHF (congestive heart failure) ([place]) 08/11/2020   CKD (chronic kidney disease), stage III ([place]) 08/11/2020   Essential hypertension 08/11/2020   Hyperlipidemia 08/11/2020   DM (diabetes mellitus) ([place]) 08/11/2020   Memory impairment 08/11/2020   CAD  (coronary artery disease) 08/11/2020   History of heart artery stent 08/11/2020   Pleural effusion 03/19/2020   PCP:  Michael Boston, MD Pharmacy:   [place], [place] [address] Phone: [phone] Fax: [phone]     Social Determinants of Health (SDOH) Social History: SDOH Screenings   Food Insecurity: No Food Insecurity (10/03/2022)  Housing: Low Risk  (10/03/2022)  Transportation Needs: No Transportation Needs (10/03/2022)  Utilities: Not At Risk (10/03/2022)  Tobacco Use: Medium Risk (10/03/2022)   SDOH Interventions:     Readmission Risk Interventions     No data to display

## 2022-10-04 NOTE — Care Management Obs Status (Deleted)
[place] NOTIFICATION   Patient Details  Name: Militza Devery MRN: [id number] Date of Birth: 1932/07/07   Medicare Observation Status Notification Given:  Yes    Henrietta Dine, RN 10/04/2022, 4:52 PM

## 2022-10-04 NOTE — Care Management Obs Status (Signed)
[place] NOTIFICATION   Patient Details  Name: [name] MRN: [id number] Date of Birth: 03/17/1932   Medicare Observation Status Notification Given:  Yes    Henrietta Dine, RN 10/04/2022, 4:52 PM

## 2022-10-04 NOTE — Consult Note (Signed)
Palliative Care Consult Note                                  Date: 10/04/2022   Patient Name: [name]  DOB: 07-07-1932  MRN: [id number]  Age / Sex: 89 y.o., female  PCP: Karina Boston, MD Referring Physician: Flora Lipps, MD  Reason for Consultation: Establishing goals of care  HPI/Patient Profile: 89 y.o. female  with past medical history of coronary artery disease, chronic diastolic heart failure, DMT2, hypertension, hyperlipidemia, and hypothyroidism presented to the ED on 10/03/2022 with complaints of constipation and nausea for the last 9 days.  She also reported abdominal pain. Imaging showed a pancreatic mass compatible with locally advanced pancreatic adenocarcinoma.   Patient has been treated with an aggressive bowel regimen and did have a large BM with relief in abdominal discomfort.   Palliative Medicine was consulted for goals of care in the setting of pancreatic mass and failure to thrive.  Past Medical History:  Diagnosis Date   Arthritis    Cancer ([place])    hx of skin cancer   CHF (congestive heart failure) ([place])    Complication of anesthesia    no narcotics per daughter   Coronary artery disease    Depression    DM (diabetes mellitus) ([place])    Essential hypertension 08/11/2020   GERD (gastroesophageal reflux disease)    Hyperlipidemia    Hypothyroidism    Myocardial infarct [facility]) 2012   Renal disorder     Subjective:   I have reviewed medical records including EPIC notes, labs and imaging, and assessed the patient at bedside. She is OOB to the recliner and has no acute complaints.   I met with patient and her daughters at bedside to discuss diagnosis, prognosis, GOC, EOL wishes, disposition, and options.  I introduced Palliative Medicine as specialized medical care for people living with serious illness. It focuses on providing relief from the symptoms and stress of a serious illness.   [name]  lives with her daughter [name]. She has been receiving home health and PT services.   We discussed the findings of pancreatic mass likely malignancy, and discussed the natural trajectory of cancer. Patient and family confirm they are not interested in pursuing further diagnostic work-up nor treatment.   Provided education on different home services in the context of goals of care. Discussed home health/PT versus home with hospice. Emphasized that the focus of home health with PT is improvement or at least maintenance of functional status, where the focus of hospice is comfort and quality of life within the setting of end-stage illness.  Provided education on the philosophy and benefits of hospice as a medicare benefit. Discussed that it offers a holistic approach to care in the setting of end-stage illness, and is about supporting the patient where they are while allowing the natural course to occur. Discussed the hospice team can provide personal care, symptom management, and help keep patient out of the hospital.  Created space and opportunity for patient and family to explore thoughts and feelings regarding current medical situation. Values and goals of care important to patient and family were attempted to be elicited.  A discussion was had today regarding advanced directives. Concepts specific to code status, artifical feeding and hydration, continued IV antibiotics and rehospitalization was had.  The MOST form was introduced and discussed.   Encouraged consideration of DNR/DNI status understanding evidenced based  poor outcomes in similar hospitalized patients, as the cause of the arrest is likely associated with chronic/terminal disease rather than a reversible acute cardio-pulmonary event. I explained that DNR/DNI does not change the medical plan and it only comes into effect after a person has arrested (died).     For now, pt/family wish to continue current [facility] and PT services. They are  agreeable to outpatient palliative with the option to transition to hospice when they are ready.   Review of Systems  All other systems reviewed and are negative.   Objective:   Primary Diagnoses: Present on Admission:  Pancreatic mass   Physical Exam Vitals reviewed.  Constitutional:      General: She is not in acute distress.    Comments: Chronically ill-appearing  Pulmonary:     Effort: Pulmonary effort is normal.  Neurological:     Mental Status: She is alert and oriented to person, place, and time.  Psychiatric:        Cognition and Memory: Cognition is impaired.     Vital Signs:  BP 137/60 (BP Location: Left Arm)   Pulse 67   Temp 98.2 F (36.8 C) (Oral)   Resp 16   Ht '5\' 7"'$  (1.702 m)   Wt 45.4 kg   SpO2 98%   BMI 15.66 kg/m   Palliative Assessment/Data: PPS 40-50%     Assessment & Plan:   SUMMARY OF RECOMMENDATIONS   Full code for now  Continue current Home Health and PT services Pending discharge home today Outpatient palliative referral made to [facility]  Primary Decision Maker: Documented HCPOA is daughters [name] and [name]; however all 3 daughters work together to make major decisions  Symptom Management:  Miralax 17 g twice daily Sennakot 1 tablet twice daily Bisacodyl suppository   Prognosis:  Unable to determine  Discharge Planning:  Home with Home Health   Discussed with: Dr. Louanne Pitts   Thank you for allowing [place] to participate in the care of [name]   MDM - High   Signed by: Elie Confer, NP Palliative Medicine Team  Team Phone # [phone]  For individual providers, please see AMION

## 2022-10-04 NOTE — Hospital Course (Addendum)
Patient is 89 years old female with history of coronary artery disease, dementia, chronic diastolic heart failure, diabetes mellitus, hypertension, hyperlipidemia and hypothyroidism presented to the hospital from home with complaints of constipation and nausea for the last 9 days prior to presentation.  She also had abdominal pain.  Of note she had been experiencing constipation on and off since July.  She was on outpatient regimen of MiraLAX and Colace for constipation but despite that had ongoing severe constipation.  Patient had also lost significant weight loss and was not eating well.. In the ED, CT scan of the abdomen was done which showed the pancreatic mass with a lipase at 89.  CT also showed stercoral colitis.  Patient did have some large bowel movement with some relief and due to leukocytosis was given Zosyn.

## 2022-10-04 NOTE — Care Management CC44 (Signed)
Condition Code 44 Documentation Completed  Patient Details  Name: [name] MRN: [id number] Date of Birth: 09-26-1931   Condition Code 44 given:  Yes Patient signature on Condition Code 44 notice:  Yes Documentation of 2 MD's agreement:  Yes Code 44 added to claim:  Yes    Henrietta Dine, RN 10/04/2022, 4:52 PM

## 2022-10-04 NOTE — Discharge Summary (Signed)
Physician Discharge Summary  [name] KNL:[id number] DOB: [birth year] DOA: 10/03/2022  PCP: Michael Boston, MD  Admit date: 10/03/2022 Discharge date: 10/04/2022  Admitted From: Home  Discharge disposition: Home with home health.  Recommendations for Outpatient Follow-Up:   Follow up with your primary care provider in one week.  Plan is to follow-up with palliative care as outpatient and transition to hospice as appropriate.  Discharge Diagnosis:   Principal Problem:   Pancreatic mass Active Problems:   Hyperlipidemia   DM (diabetes mellitus) (HCC)   Depression   GERD (gastroesophageal reflux disease)   Hypothyroidism  Discharge Condition: Improved.  Diet recommendation: Soft diet as tolerated  Wound care: None.  Code status: Full.   History of Present Illness:   Patient is 89 years old female with history of coronary artery disease, dementia, chronic diastolic heart failure, diabetes mellitus, hypertension, hyperlipidemia and hypothyroidism presented to hospital from home with complaints of constipation and nausea for the last 9 days prior to presentation.  She also had abdominal pain.  Of note she had been experiencing constipation on and off since July.  She was on outpatient regimen of MiraLAX and Colace for constipation.  She complained of last bowel movement 9 days prior to presentation and in the ED enema was given with some relief.  Daughter at bedside reported that patient has had significant weight loss over the last few weeks and has not been eating well.  He has been complaining of a lot of nausea and had vomiting few times a yesterday.Denied any fever or chills.  Denies any urinary urgency, frequency or dysuria.  In the ED, CT scan of the abdomen was done which showed the pancreatic mass with a lipase at 89.  CT also showed stercoral colitis.  Patient did have some large bowel movement with some relief and due to leukocytosis was given Zosyn.   Patient was then  admitted hospital for further evaluation and treatment.   Hospital Course:   Following conditions were addressed during hospitalization as listed below,  Constipation, multiple episodes of vomiting, significant weight loss, poor oral intake nausea.  Had mild leukocytosis on presentation which has improved.  Intermittent constipation going on for a while but pain got worse.  Patient received aggressive bowel regimen with enema and relief of abdominal discomfort.  Will continue bowel regimen on discharge as well.   Pancreatic mass.  CT scan of the abdomen with contrast showed ill-defined 4.6 x 3 cm hypodense mass in the pancreatic body with encasement of the proximal celiac artery and its branches likely suggestive of malignancy.  MRI pancreatic protocol was performed which again showed poorly marginated irregular hypoenhancing 4.8 x 3.3 cm pancreatic body mass compatible with locally advanced pancreatic adenocarcinoma with a splenic vein occlusion and involvement of the celiac trunk SMA and splenic artery.  No frank metastatic disease noted in the abdomen.  I had a prolonged discussion with the patient's daughter about these findings and at this time she wishes to pursue with palliative care option.  Palliative care was consulted with plans for outpatient PT and palliative care with possible transition to hospice after discharge.  Severe protein calorie malnutrition/failure to thrive.  Present on admission.  Likely secondary to pancreatic malignancy.  As evidenced by Body mass index is 15.66 kg/m.  With significant muscle and fat depletion.  Significant weight loss reported by the family.  Encourage oral nutrition.   History of dementia Continue supportive care.   History of CAD Patient is  on aspirin Lipitor and Plavix at home.     History of chronic diastolic heart failure Patient is on aspirin Plavix, Lipitor and Lasix at home.  Has remained compensated at this time.  Diabetes mellitus type  2 On insulin regimen with Tresiba at home.    Hypertension Will monitor.  Not on medications at home.   Hyperlipidemia On Lipitor at home.     Hypothyroidism Continue Synthroid. TSH at 0.7  Disposition.  At this time, patient is stable for disposition with home health.  Plan for outpatient palliative care/hospice.  Medical Consultants:   Palliative care  Procedures:    None Subjective:   Today, patient was seen and examined at bedside.  Feels little better today and is hungry wishes to eat something.  Denies overt pain.  Discharge Exam:   Vitals:   10/04/22 0949 10/04/22 1319  BP: 135/64 128/68  Pulse: 70 72  Resp: 16 16  Temp: 98.1 F (36.7 C) (!) 97.3 F (36.3 C)  SpO2: 100% 97%   Vitals:   10/04/22 0202 10/04/22 0532 10/04/22 0949 10/04/22 1319  BP: (!) 127/96 137/60 135/64 128/68  Pulse: 74 67 70 72  Resp: '16 16 16 16  '$ Temp: 97.8 F (36.6 C) 98.2 F (36.8 C) 98.1 F (36.7 C) (!) 97.3 F (36.3 C)  TempSrc: Oral Oral Oral Oral  SpO2: 97% 98% 100% 97%  Weight:      Height:       Body mass index is 15.66 kg/m.  General: Alert awake, not in obvious distress, frail-appearing, Communicative, hard of hearing, cachectic HENT: pupils equally reacting to light,  No scleral pallor or icterus noted. Oral mucosa is moist.  Chest:  Clear breath sounds.  Diminished breath sounds bilaterally. No crackles or wheezes.  CVS: S1 &S2 heard. No murmur.  Regular rate and rhythm. Abdomen: Soft, nontender, nondistended.  Bowel sounds are heard.   Extremities: No cyanosis, clubbing or edema.  Peripheral pulses are palpable. Psych: Alert, awake and Communicative, appears ill, CNS:  No cranial nerve deficits.  Moves all extremities, Skin: Warm and dry.  No rashes noted.  The results of significant diagnostics from this hospitalization (including imaging, microbiology, ancillary and laboratory) are listed below for reference.     Diagnostic Studies:   MR ABDOMEN W WO  CONTRAST  Result Date: 10/03/2022 CLINICAL DATA:  Inpatient.  Pancreatic mass on CT. EXAM: MRI ABDOMEN WITHOUT AND WITH CONTRAST TECHNIQUE: Multiplanar multisequence MR imaging of the abdomen was performed both before and after the administration of intravenous contrast. CONTRAST:  4.30m GADAVIST GADOBUTROL 1 MMOL/ML IV SOLN COMPARISON:  10/03/2022 CT abdomen/pelvis. FINDINGS: Substantially motion degraded scan, limiting assessment. Lower chest: No acute abnormality at the lung bases. Hepatobiliary: Normal liver size and configuration. No hepatic steatosis. Tiny 0.4 cm T2 hyperintense liver dome lesion (series 24/image 6 and series 15/image 12) without appreciable enhancement on the motion degraded postcontrast sequences, compatible with a cyst. No additional liver lesions. Normal gallbladder with no cholelithiasis. No biliary ductal dilatation. Common bile duct diameter 4 mm. No choledocholithiasis. No biliary masses, strictures or beading. Pancreas: Poorly marginated irregular hypoenhancing 4.8 x 3.3 cm pancreatic body mass (series 15/image 37), which encases and substantially narrows the splenic artery, which encases approximately 70% of the SMA circumference, which encases the distal celiac trunk, which occludes the splenic vein and which mildly narrows the portal splenic venous confluence with approximately 50% involvement of the circumferential of the proximal main portal vein, which remains patent. The pancreatic mass  abuts the lesser curvature of the proximal stomach without frank gastric invasion. Atrophy of the remnant pancreatic tail with mildly dilated pancreatic duct (3 mm diameter) in the atrophic pancreatic tail. No pancreas divisum. Spleen: Normal size. No mass. Adrenals/Urinary Tract: No discrete adrenal nodules. Mild asymmetric generalized thickening of the left adrenal gland is stable back to 08/11/2020 chest CT, favoring mild adrenal hyperplasia. No hydronephrosis. Normal kidneys with no renal  mass. Stomach/Bowel: Small hiatal hernia. Otherwise normal nondistended stomach. Visualized small and large bowel is normal caliber, with no bowel wall thickening. Vascular/Lymphatic: Atherosclerotic abdominal aorta with 3.3 cm infrarenal abdominal aortic aneurysm. Patent renal and hepatic veins. No pathologically enlarged lymph nodes in the abdomen. Other: No abdominal ascites or focal fluid collection. Musculoskeletal: No aggressive appearing focal osseous lesions. IMPRESSION: 1. Poorly marginated irregular hypoenhancing 4.8 x 3.3 cm pancreatic body mass, compatible with locally advanced primary pancreatic adenocarcinoma. Splenic vein occluded by the mass. Significant involvement of the celiac trunk, SMA, splenic artery and portosplenic venous confluence by the mass as detailed. The pancreatic mass abuts the lesser curvature of the proximal stomach without frank gastric invasion. 2. No findings of metastatic disease in the abdomen. 3. Small hiatal hernia. 4. Atherosclerotic abdominal aorta with 3.3 cm infrarenal abdominal aortic aneurysm. Recommend follow-up ultrasound every 3 years. This recommendation follows ACR consensus guidelines: White Paper of the ACR Incidental Findings Committee II on Vascular Findings. J Am Coll Radiol 2013; 10:789-794. Electronically Signed   By: Ilona Sorrel M.D.   On: 10/03/2022 20:51   MR 3D Recon At Scanner  Result Date: 10/03/2022 CLINICAL DATA:  Inpatient.  Pancreatic mass on CT. EXAM: MRI ABDOMEN WITHOUT AND WITH CONTRAST TECHNIQUE: Multiplanar multisequence MR imaging of the abdomen was performed both before and after the administration of intravenous contrast. CONTRAST:  4.79m GADAVIST GADOBUTROL 1 MMOL/ML IV SOLN COMPARISON:  10/03/2022 CT abdomen/pelvis. FINDINGS: Substantially motion degraded scan, limiting assessment. Lower chest: No acute abnormality at the lung bases. Hepatobiliary: Normal liver size and configuration. No hepatic steatosis. Tiny 0.4 cm T2  hyperintense liver dome lesion (series 24/image 6 and series 15/image 12) without appreciable enhancement on the motion degraded postcontrast sequences, compatible with a cyst. No additional liver lesions. Normal gallbladder with no cholelithiasis. No biliary ductal dilatation. Common bile duct diameter 4 mm. No choledocholithiasis. No biliary masses, strictures or beading. Pancreas: Poorly marginated irregular hypoenhancing 4.8 x 3.3 cm pancreatic body mass (series 15/image 37), which encases and substantially narrows the splenic artery, which encases approximately 70% of the SMA circumference, which encases the distal celiac trunk, which occludes the splenic vein and which mildly narrows the portal splenic venous confluence with approximately 50% involvement of the circumferential of the proximal main portal vein, which remains patent. The pancreatic mass abuts the lesser curvature of the proximal stomach without frank gastric invasion. Atrophy of the remnant pancreatic tail with mildly dilated pancreatic duct (3 mm diameter) in the atrophic pancreatic tail. No pancreas divisum. Spleen: Normal size. No mass. Adrenals/Urinary Tract: No discrete adrenal nodules. Mild asymmetric generalized thickening of the left adrenal gland is stable back to 08/11/2020 chest CT, favoring mild adrenal hyperplasia. No hydronephrosis. Normal kidneys with no renal mass. Stomach/Bowel: Small hiatal hernia. Otherwise normal nondistended stomach. Visualized small and large bowel is normal caliber, with no bowel wall thickening. Vascular/Lymphatic: Atherosclerotic abdominal aorta with 3.3 cm infrarenal abdominal aortic aneurysm. Patent renal and hepatic veins. No pathologically enlarged lymph nodes in the abdomen. Other: No abdominal ascites or focal fluid collection. Musculoskeletal: No  aggressive appearing focal osseous lesions. IMPRESSION: 1. Poorly marginated irregular hypoenhancing 4.8 x 3.3 cm pancreatic body mass, compatible with  locally advanced primary pancreatic adenocarcinoma. Splenic vein occluded by the mass. Significant involvement of the celiac trunk, SMA, splenic artery and portosplenic venous confluence by the mass as detailed. The pancreatic mass abuts the lesser curvature of the proximal stomach without frank gastric invasion. 2. No findings of metastatic disease in the abdomen. 3. Small hiatal hernia. 4. Atherosclerotic abdominal aorta with 3.3 cm infrarenal abdominal aortic aneurysm. Recommend follow-up ultrasound every 3 years. This recommendation follows ACR consensus guidelines: White Paper of the ACR Incidental Findings Committee II on Vascular Findings. J Am Coll Radiol 2013; 10:789-794. Electronically Signed   By: Ilona Sorrel M.D.   On: 10/03/2022 20:51   CT Abdomen Pelvis W Contrast  Result Date: 10/03/2022 CLINICAL DATA:  Abdominal pain, acute, nonlocalized EXAM: CT ABDOMEN AND PELVIS WITH CONTRAST TECHNIQUE: Multidetector CT imaging of the abdomen and pelvis was performed using the standard protocol following bolus administration of intravenous contrast. RADIATION DOSE REDUCTION: This exam was performed according to the departmental dose-optimization program which includes automated exposure control, adjustment of the mA and/or kV according to patient size and/or use of iterative reconstruction technique. CONTRAST:  87m OMNIPAQUE IOHEXOL 300 MG/ML  SOLN COMPARISON:  Ultrasound abdomen 09/07/2022 FINDINGS: Lower chest: No acute abnormality. Small hiatal hernia. Atherosclerotic plaque. Hepatobiliary: No focal liver abnormality. No gallstones, gallbladder wall thickening, or pericholecystic fluid. No biliary dilatation. Pancreas: Ill-defined 4.6 x 3 cm hypodense mass centered within the pancreatic body with encasement of the proximal celiac artery and its branches as well as the proximal superior mesenteric artery. Finding abuts and is inseparable from the lesser curvature of the stomach (6:30). Normal pancreatic  contour. No surrounding inflammatory changes. No main pancreatic ductal dilatation. Spleen: Multiple subcentimeter hypodensities-indeterminate. Otherwise normal in size. Adrenals/Urinary Tract: No adrenal nodule bilaterally. Bilateral kidneys enhance symmetrically. Subcentimeter hypodensities are too small to characterize-no further follow-up indicated. No hydronephrosis. No hydroureter. The urinary bladder is unremarkable. Stomach/Bowel: Stomach is within normal limits. No evidence of bowel wall thickening or dilatation. Stool throughout the colon. Mild bowel wall thickening and fat stranding of the descending colon. Appendix appears normal. Vascular/Lymphatic: No abdominal aorta or iliac aneurysm. Severe atherosclerotic plaque of the aorta and its branches. No abdominal, pelvic, or inguinal lymphadenopathy. Reproductive: Haziness of the small bowel mesentery. Status post hysterectomy. No right adnexal mass. A 1.9 x 1.4 cm soft tissue density (2:53) along the left pelvic sidewall may represent an ovary. Other: No intraperitoneal free fluid. No intraperitoneal free gas. No organized fluid collection. Musculoskeletal: No abdominal wall hernia or abnormality. No suspicious lytic or blastic osseous lesions. No acute displaced fracture. Dextroscoliosis of the thoracolumbar spine with associated severe multilevel degenerative changes of the spine. Total left hip arthroplasty partially visualized. IMPRESSION: 1. Ill-defined 4.6 x 3 cm mass centered within the pancreatic body with encasement of the proximal celiac artery and its branches as well as the proximal superior mesenteric artery. Mass abuts and is inseparable from the lesser curvature of the stomach. Findings suggestive of malignancy. Recommend MRI pancreatic protocol for further evaluation. 2. A 1.9 x 1.4 cm soft tissue density along the left pelvic sidewall may represent an ovary. Recommend attention on follow-up. 3. Misty mesentery of the small bowel mesentery.  4. Multiple indeterminate subcentimeter splenic attention on follow-up. 5. Constipation with developing stercoral colitis versus descending colitis. 6. Small hiatal hernia. 7.  Aortic Atherosclerosis (ICD10-I70.0). Electronically Signed  By: Iven Finn M.D.   On: 10/03/2022 02:54     Labs:   Basic Metabolic Panel: Recent Labs  Lab 10/03/22 0013 10/04/22 0524  NA 136 136  K 3.7 4.0  CL 99 103  CO2 26 26  GLUCOSE 235* 125*  BUN 21 15  CREATININE 1.19* 1.01*  CALCIUM 9.9 8.3*  MG  --  2.3   GFR Estimated Creatinine Clearance: 27.6 mL/min (A) (by C-G formula based on SCr of 1.01 mg/dL (H)). Liver Function Tests: Recent Labs  Lab 10/03/22 0013 10/04/22 0524  AST 26 20  ALT 19 15  ALKPHOS 86 58  BILITOT 0.6 0.5  PROT 7.3 5.6*  ALBUMIN 3.9 3.1*   Recent Labs  Lab 10/03/22 0013  LIPASE 84*   No results for input(s): "AMMONIA" in the last 168 hours. Coagulation profile No results for input(s): "INR", "PROTIME" in the last 168 hours.  CBC: Recent Labs  Lab 10/03/22 0013 10/04/22 0524  WBC 18.8* 8.6  HGB 13.0 10.1*  HCT 39.7 31.7*  MCV 97.8 100.3*  PLT 451* 253   Cardiac Enzymes: No results for input(s): "CKTOTAL", "CKMB", "CKMBINDEX", "TROPONINI" in the last 168 hours. BNP: Invalid input(s): "POCBNP" CBG: Recent Labs  Lab 10/03/22 1650 10/03/22 2239 10/04/22 0742 10/04/22 1141  GLUCAP 117* 145* 123* 131*   D-Dimer No results for input(s): "DDIMER" in the last 72 hours. Hgb A1c No results for input(s): "HGBA1C" in the last 72 hours. Lipid Profile No results for input(s): "CHOL", "HDL", "LDLCALC", "TRIG", "CHOLHDL", "LDLDIRECT" in the last 72 hours. Thyroid function studies Recent Labs    10/04/22 0524  TSH 0.782   Anemia work up No results for input(s): "VITAMINB12", "FOLATE", "FERRITIN", "TIBC", "IRON", "RETICCTPCT" in the last 72 hours. Microbiology No results found for this or any previous visit (from the past 240  hour(s)).   Discharge Instructions:   Discharge Instructions     Discharge instructions   Complete by: As directed    Follow-up with your primary care provider in 1 week.  Take medications as prescribed.  Plan to transition to palliative care/transition at hospice at home.  Seek medical attention/palliative care attention for worsening symptoms.   Increase activity slowly   Complete by: As directed       Allergies as of 10/04/2022       Reactions   Tape Other (See Comments)   SKIN IS VERY THIN AND TEARS EXTREMELY EASILY!!   Lisinopril Cough   Morphine And Related    Narcotic pain meds/Morphine exacerbate dementia symptoms (combative)   Nitrofuran Derivatives Nausea And Vomiting, Other (See Comments), Cough   Dizziness, also   Other    Narcotic pain meds exacerbate dementia symptoms (combative)        Medication List     STOP taking these medications    aspirin EC 81 MG tablet   Gemtesa 75 MG Tabs Generic drug: Vibegron       TAKE these medications    acetaminophen 500 MG tablet Commonly known as: TYLENOL Take 1,000 mg by mouth at bedtime.   atorvastatin 40 MG tablet Commonly known as: LIPITOR Take 1 tablet (40 mg total) by mouth daily. What changed: when to take this   Biotin 1000 MCG tablet Take 1,000 mcg by mouth daily.   bisacodyl 10 MG suppository Commonly known as: DULCOLAX Place 1 suppository (10 mg total) rectally every 3 (three) days.   clopidogrel 75 MG tablet Commonly known as: PLAVIX Take 75 mg by mouth every  Monday, Wednesday, and Friday.   donepezil 10 MG tablet Commonly known as: ARICEPT TAKE 1 TABLET BY MOUTH AT  BEDTIME   furosemide 20 MG tablet Commonly known as: LASIX Take 1 tablet (20 mg total) by mouth daily as needed (weight gain 2-3 lbs.). What changed:  when to take this additional instructions   HYDROcodone-acetaminophen 5-325 MG tablet Commonly known as: NORCO/VICODIN Take 1 tablet by mouth every 6 (six) hours as  needed for severe pain (severe pain only).   levothyroxine 25 MCG tablet Commonly known as: SYNTHROID Take 25 mcg by mouth daily before breakfast.   Lyumjev KwikPen 100 UNIT/ML KwikPen Generic drug: Insulin Lispro-aabc Inject 2-3 Units into the skin See admin instructions. Inject 2-3 units into the skin WITH THE LARGEST MEAL OF THE DAY- USUALLY IN THE EVENING   ondansetron 4 MG tablet Commonly known as: ZOFRAN Take 4 mg by mouth every 8 (eight) hours as needed for nausea or vomiting.   pantoprazole 40 MG tablet Commonly known as: PROTONIX Take 1 tablet (40 mg total) by mouth daily. Start taking on: October 05, 2022   polyethylene glycol 17 g packet Commonly known as: MIRALAX / GLYCOLAX Take 17 g by mouth 2 (two) times daily.   senna-docusate 8.6-50 MG tablet Commonly known as: Senokot-S Take 1 tablet by mouth 2 (two) times daily.   sertraline 100 MG tablet Commonly known as: ZOLOFT Take 100 mg by mouth at bedtime.   Tyler Aas FlexTouch 100 UNIT/ML FlexTouch Pen Generic drug: insulin degludec Inject 4 Units into the skin every evening.   Vitamin D3 Super Strength 50 MCG (2000 UT) Tabs Generic drug: Cholecalciferol Take 2,000 Units by mouth daily.        Follow-up Information     Michael Boston, MD Follow up in 1 week(s).   Specialty: Internal Medicine Contact information: [address] [name] [address] [phone]                  Time coordinating discharge: 39 minutes  Signed:  Jakorian Marengo  [facility] 10/04/2022, 3:29 PM

## 2022-10-06 ENCOUNTER — Encounter: Payer: Self-pay | Admitting: Cardiovascular Disease

## 2022-11-27 ENCOUNTER — Encounter: Payer: Self-pay | Admitting: Cardiovascular Disease

## 2022-12-01 ENCOUNTER — Ambulatory Visit: Payer: Medicare Other | Admitting: Cardiovascular Disease

## 2022-12-21 DEATH — deceased

## 2023-04-15 ENCOUNTER — Telehealth: Payer: Self-pay | Admitting: Family Medicine

## 2023-04-15 NOTE — Telephone Encounter (Signed)
Pt passed away 12/20/22.

## 2023-04-22 ENCOUNTER — Ambulatory Visit: Payer: Medicare Other | Admitting: Family Medicine
